# Patient Record
Sex: Male | Born: 1990
Health system: Southern US, Community
[De-identification: ages and names within clinical notes are randomized; demographics above are authoritative.]

## PROBLEM LIST (undated history)

## (undated) DIAGNOSIS — F419 Anxiety disorder, unspecified: Secondary | ICD-10-CM

## (undated) DIAGNOSIS — R000 Tachycardia, unspecified: Secondary | ICD-10-CM

## (undated) DIAGNOSIS — G90A Postural orthostatic tachycardia syndrome (POTS): Secondary | ICD-10-CM

## (undated) DIAGNOSIS — I429 Cardiomyopathy, unspecified: Secondary | ICD-10-CM

## (undated) DIAGNOSIS — K259 Gastric ulcer, unspecified as acute or chronic, without hemorrhage or perforation: Secondary | ICD-10-CM

## (undated) DIAGNOSIS — R002 Palpitations: Secondary | ICD-10-CM

## (undated) DIAGNOSIS — R079 Chest pain, unspecified: Secondary | ICD-10-CM

## (undated) DIAGNOSIS — K589 Irritable bowel syndrome without diarrhea: Secondary | ICD-10-CM

## (undated) DIAGNOSIS — K219 Gastro-esophageal reflux disease without esophagitis: Secondary | ICD-10-CM

## (undated) HISTORY — DX: Postural orthostatic tachycardia syndrome (POTS): G90.A

## (undated) HISTORY — PX: COLONOSCOPY: SHX174

## (undated) HISTORY — DX: Palpitations: R00.2

## (undated) HISTORY — PX: COSMETIC SURGERY: SHX468

## (undated) HISTORY — DX: Chest pain, unspecified: R07.9

## (undated) HISTORY — DX: Gastric ulcer, unspecified as acute or chronic, without hemorrhage or perforation: K25.9

## (undated) HISTORY — DX: Cardiomyopathy, unspecified: I42.9

---

## 2012-02-29 ENCOUNTER — Emergency Department: Payer: Self-pay | Admitting: Emergency Medicine

## 2012-02-29 LAB — URINALYSIS, COMPLETE
Bacteria: NONE SEEN
Bilirubin,UR: NEGATIVE
Blood: NEGATIVE
Leukocyte Esterase: NEGATIVE
Nitrite: NEGATIVE
Ph: 5 (ref 4.5–8.0)
Protein: NEGATIVE
Specific Gravity: 1.019 (ref 1.003–1.030)
Squamous Epithelial: NONE SEEN
WBC UR: 2 /HPF (ref 0–5)

## 2012-02-29 LAB — BASIC METABOLIC PANEL
Anion Gap: 8 (ref 7–16)
Calcium, Total: 8.3 mg/dL — ABNORMAL LOW (ref 8.5–10.1)
Chloride: 102 mmol/L (ref 98–107)
Co2: 27 mmol/L (ref 21–32)
EGFR (African American): >60
EGFR (Non-African Amer.): >60
Sodium: 137 mmol/L (ref 136–145)

## 2012-02-29 LAB — CBC WITH DIFFERENTIAL/PLATELET
Eosinophil #: 0 10*3/uL (ref 0.0–0.7)
Eosinophil %: 0.5 %
HCT: 44.7 % (ref 40.0–52.0)
MCHC: 33.9 g/dL (ref 32.0–36.0)
MCV: 93 fL (ref 80–100)
Monocyte %: 3.2 %
Neutrophil %: 93.8 %
Platelet: 138 10*3/uL — ABNORMAL LOW (ref 150–440)
RDW: 12.1 % (ref 11.5–14.5)
WBC: 6.5 10*3/uL (ref 3.8–10.6)

## 2012-02-29 LAB — RAPID INFLUENZA A&B ANTIGENS

## 2012-03-01 DIAGNOSIS — Z8711 Personal history of peptic ulcer disease: Secondary | ICD-10-CM | POA: Insufficient documentation

## 2012-03-01 DIAGNOSIS — K259 Gastric ulcer, unspecified as acute or chronic, without hemorrhage or perforation: Secondary | ICD-10-CM

## 2012-03-01 DIAGNOSIS — Z8719 Personal history of other diseases of the digestive system: Secondary | ICD-10-CM | POA: Insufficient documentation

## 2012-03-01 HISTORY — DX: Gastric ulcer, unspecified as acute or chronic, without hemorrhage or perforation: K25.9

## 2012-03-01 LAB — DRUG SCREEN, URINE
Amphetamines, Ur Screen: NEGATIVE (ref ?–1000)
Barbiturates, Ur Screen: NEGATIVE (ref ?–200)
Cannabinoid 50 Ng, Ur ~~LOC~~: NEGATIVE (ref ?–50)
Cocaine Metabolite,Ur ~~LOC~~: NEGATIVE (ref ?–300)
Phencyclidine (PCP) Ur S: NEGATIVE (ref ?–25)
Tricyclic, Ur Screen: NEGATIVE (ref ?–1000)

## 2012-03-03 LAB — BETA STREP CULTURE(ARMC)

## 2012-03-20 ENCOUNTER — Inpatient Hospital Stay: Payer: Self-pay | Admitting: Internal Medicine

## 2012-03-20 LAB — COMPREHENSIVE METABOLIC PANEL
Anion Gap: 9 (ref 7–16)
Bilirubin,Total: 1.5 mg/dL — ABNORMAL HIGH (ref 0.2–1.0)
Calcium, Total: 8.4 mg/dL — ABNORMAL LOW (ref 8.5–10.1)
Chloride: 103 mmol/L (ref 98–107)
Creatinine: 0.85 mg/dL (ref 0.60–1.30)
EGFR (African American): >60
EGFR (Non-African Amer.): >60
Glucose: 115 mg/dL — ABNORMAL HIGH (ref 65–99)
Osmolality: 283 (ref 275–301)
Sodium: 142 mmol/L (ref 136–145)
Total Protein: 6.8 g/dL (ref 6.4–8.2)

## 2012-03-20 LAB — CBC
HCT: 43.1 % (ref 40.0–52.0)
HGB: 14.1 g/dL (ref 13.0–18.0)
MCH: 30.4 pg (ref 26.0–34.0)
MCHC: 32.7 g/dL (ref 32.0–36.0)
MCV: 93 fL (ref 80–100)
Platelet: 182 10*3/uL (ref 150–440)
RBC: 4.63 10*6/uL (ref 4.40–5.90)
WBC: 4.7 10*3/uL (ref 3.8–10.6)

## 2012-03-20 LAB — URINALYSIS, COMPLETE
Bacteria: NONE SEEN
Ketone: NEGATIVE
Leukocyte Esterase: NEGATIVE
Nitrite: NEGATIVE
Ph: 7 (ref 4.5–8.0)
Protein: NEGATIVE

## 2012-03-20 LAB — DRUG SCREEN, URINE
Amphetamines, Ur Screen: NEGATIVE (ref ?–1000)
Barbiturates, Ur Screen: NEGATIVE (ref ?–200)
Benzodiazepine, Ur Scrn: NEGATIVE (ref ?–200)
MDMA (Ecstasy)Ur Screen: NEGATIVE (ref ?–500)
Methadone, Ur Screen: NEGATIVE (ref ?–300)
Phencyclidine (PCP) Ur S: NEGATIVE (ref ?–25)

## 2012-03-20 LAB — LIPASE, BLOOD: Lipase: 100 U/L (ref 73–393)

## 2012-03-20 LAB — TROPONIN I: Troponin-I: <0.02 ng/mL

## 2012-03-20 LAB — AMYLASE: Amylase: 37 U/L (ref 25–115)

## 2012-03-20 LAB — CK TOTAL AND CKMB (NOT AT ARMC): CK-MB: 1 ng/mL (ref 0.5–3.6)

## 2012-03-21 LAB — COMPREHENSIVE METABOLIC PANEL
Albumin: 3.6 g/dL (ref 3.4–5.0)
Alkaline Phosphatase: 50 U/L (ref 50–136)
Anion Gap: 5 — ABNORMAL LOW (ref 7–16)
BUN: 10 mg/dL (ref 7–18)
Bilirubin,Total: 1.9 mg/dL — ABNORMAL HIGH (ref 0.2–1.0)
Calcium, Total: 8.3 mg/dL — ABNORMAL LOW (ref 8.5–10.1)
EGFR (African American): >60
EGFR (Non-African Amer.): >60
Glucose: 94 mg/dL (ref 65–99)
Osmolality: 280 (ref 275–301)
Potassium: 3.9 mmol/L (ref 3.5–5.1)
SGOT(AST): 17 U/L (ref 15–37)
Sodium: 141 mmol/L (ref 136–145)

## 2012-03-21 LAB — CK TOTAL AND CKMB (NOT AT ARMC)
CK, Total: 90 U/L (ref 35–232)
CK, Total: 90 U/L (ref 35–232)
CK-MB: <0.5 ng/mL — ABNORMAL LOW (ref 0.5–3.6)

## 2012-03-21 LAB — BILIRUBIN, DIRECT: Bilirubin, Direct: 0.2 mg/dL (ref 0.00–0.20)

## 2012-03-21 LAB — TROPONIN I: Troponin-I: <0.02 ng/mL

## 2012-03-22 LAB — HEPATIC FUNCTION PANEL A (ARMC)
Albumin: 3.6 g/dL (ref 3.4–5.0)
Alkaline Phosphatase: 56 U/L (ref 50–136)
SGPT (ALT): 25 U/L

## 2012-03-22 LAB — BASIC METABOLIC PANEL
Calcium, Total: 8.6 mg/dL (ref 8.5–10.1)
Co2: 24 mmol/L (ref 21–32)
Creatinine: 0.79 mg/dL (ref 0.60–1.30)
EGFR (African American): >60
Glucose: 59 mg/dL — ABNORMAL LOW (ref 65–99)
Potassium: 4 mmol/L (ref 3.5–5.1)

## 2012-03-22 LAB — CBC WITH DIFFERENTIAL/PLATELET
Basophil #: 0 10*3/uL (ref 0.0–0.1)
Eosinophil #: 0.2 10*3/uL (ref 0.0–0.7)
HCT: 42 % (ref 40.0–52.0)
HGB: 13.7 g/dL (ref 13.0–18.0)
Lymphocyte #: 0.9 10*3/uL — ABNORMAL LOW (ref 1.0–3.6)
Lymphocyte %: 24.8 %
MCHC: 32.7 g/dL (ref 32.0–36.0)
Monocyte %: 8.3 %
Neutrophil %: 60 %
RDW: 12.3 % (ref 11.5–14.5)
WBC: 3.5 10*3/uL — ABNORMAL LOW (ref 3.8–10.6)

## 2012-03-23 LAB — HEPATIC FUNCTION PANEL A (ARMC)
Albumin: 3.6 g/dL (ref 3.4–5.0)
Bilirubin, Direct: 0.2 mg/dL (ref 0.00–0.20)
SGOT(AST): 18 U/L (ref 15–37)
SGPT (ALT): 24 U/L

## 2012-03-24 LAB — CBC WITH DIFFERENTIAL/PLATELET
Eosinophil #: 0.2 10*3/uL (ref 0.0–0.7)
Eosinophil %: 7.2 %
Lymphocyte #: 0.8 10*3/uL — ABNORMAL LOW (ref 1.0–3.6)
Lymphocyte %: 29 %
MCH: 30.9 pg (ref 26.0–34.0)
MCHC: 33.5 g/dL (ref 32.0–36.0)
MCV: 92 fL (ref 80–100)
Monocyte #: 0.3 x10 3/mm (ref 0.2–1.0)
Monocyte %: 11.1 %
Neutrophil #: 1.5 10*3/uL (ref 1.4–6.5)
Neutrophil %: 51.6 %
RDW: 12.3 % (ref 11.5–14.5)

## 2012-03-24 LAB — COMPREHENSIVE METABOLIC PANEL
Albumin: 3.8 g/dL (ref 3.4–5.0)
Alkaline Phosphatase: 47 U/L — ABNORMAL LOW (ref 50–136)
BUN: 8 mg/dL (ref 7–18)
Bilirubin,Total: 2.5 mg/dL — ABNORMAL HIGH (ref 0.2–1.0)
Calcium, Total: 8.7 mg/dL (ref 8.5–10.1)
Chloride: 104 mmol/L (ref 98–107)
Osmolality: 277 (ref 275–301)
SGOT(AST): 23 U/L (ref 15–37)
Sodium: 140 mmol/L (ref 136–145)

## 2012-03-30 ENCOUNTER — Ambulatory Visit (INDEPENDENT_AMBULATORY_CARE_PROVIDER_SITE_OTHER): Payer: 59 | Admitting: Internal Medicine

## 2012-03-30 ENCOUNTER — Encounter: Payer: Self-pay | Admitting: Internal Medicine

## 2012-03-30 VITALS — BP 112/82 | HR 76 | Temp 98.2°F | Resp 16 | Ht 74.0 in | Wt 148.8 lb

## 2012-03-30 DIAGNOSIS — K259 Gastric ulcer, unspecified as acute or chronic, without hemorrhage or perforation: Secondary | ICD-10-CM | POA: Insufficient documentation

## 2012-03-30 MED ORDER — HYOSCYAMINE SULFATE 0.125 MG SL SUBL: 0.1250 mg | SUBLINGUAL_TABLET | SUBLINGUAL | Status: DC | PRN

## 2012-03-30 MED ORDER — SUCRALFATE 1 GM/10ML PO SUSP: 1.0000 g | Freq: Three times a day (TID) | ORAL | Status: DC

## 2012-03-30 NOTE — Assessment & Plan Note (Signed)
He continues to have pain 6/10 after eating,.  Will add sucralfate and hyoscyamine to twice daily protonix

## 2012-03-30 NOTE — Patient Instructions (Signed)
Take the hyoscyamine pill under tongue 15 minutes before you eat to see if it helps  with the spasm you are having in your esophagus  Take the liquid medicine 2 hours after your protonix to coat your stomach,  You can take it up to 3 times daily if needed for stomach .   Drink  as many Ensure daily as you can tolerate  To regain the weight you lost.

## 2012-03-30 NOTE — Progress Notes (Signed)
Patient ID: Christian Montgomery, male   DOB: 1991-03-05, 21 y.o.   MRN: [id number] Patient Active Problem List  Diagnoses  . Gastric ulcer without hemorrhage or perforation but with obstruction    Subjective:  CC:   Chief Complaint  Patient presents with  . New Patient    HPI: To establish care.  He is a previously healthy 21 yr old white male who was admitted to [facility] with gastric outlet obstruction and wt loss of 10-15 lbs ,  Found to be secondary to gastic ulcer with surrounding inflammation, presumed secondary to NSAIDs. Was decompressed with NG tube underwent endoscopy, and discharged home after 48 hours on protonix bid,..  His GI follow up with Christian Montgomery is in 2 weeks.  He is drinking Ensure once daily.  He reports  Sensitivity to certain foods which cause  cramping and continues to have episodes of chest pain after eating. Christian Sorrow Smithis a 21 y.o. male who presents   History reviewed. No pertinent past medical history.  History reviewed. No pertinent past surgical history.       The following portions of the patient's history were reviewed and updated as appropriate: Allergies, current medications, and problem list.    Review of Systems:   12 Pt  review of systems was negative except those addressed in the HPI,     History   Social History  . Marital Status: Single    Spouse Name: N/A    Number of Children: N/A  . Years of Education: N/A   Occupational History  . Not on file.   Social History Main Topics  . Smoking status: Never Smoker   . Smokeless tobacco: Never Used  . Alcohol Use: No  . Drug Use: No  . Sexually Active: Not on file   Other Topics Concern  . Not on file   Social History Narrative  . No narrative on file    Objective:  BP 112/82  Pulse 76  Temp(Src) 98.2 F (36.8 C) (Oral)  Resp 16  Ht 6\' 2"  (1.88 m)  Wt 148 lb 12 oz (67.473 kg)  BMI 19.10 kg/m2  SpO2 100%  General appearance: alert, cooperative and appears stated age Ears:  normal TM's and external ear canals both ears Throat: lips, mucosa, and tongue normal; teeth and gums normal Neck: no adenopathy, no carotid bruit, supple, symmetrical, trachea midline and thyroid not enlarged, symmetric, no tenderness/mass/nodules Back: symmetric, no curvature. ROM normal. No CVA tenderness. Lungs: clear to auscultation bilaterally Heart: regular rate and rhythm, S1, S2 normal, no murmur, click, rub or gallop Abdomen: soft, non-tender; bowel sounds normal; no masses,  no organomegaly Pulses: 2+ and symmetric Skin: Skin color, texture, turgor normal. No rashes or lesions Lymph nodes: Cervical, supraclavicular, and axillary nodes normal.  Assessment and Plan:  Gastric ulcer without hemorrhage or perforation but with obstruction He continues to have pain 6/10 after eating,.  Will add sucralfate and hyoscyamine to twice daily protonix      Updated Medication List Outpatient Encounter Prescriptions as of 03/30/2012  Medication Sig Dispense Refill  . Multiple Vitamin (MULTIVITAMIN) tablet Take 1 tablet by mouth daily.      . pantoprazole (PROTONIX) 40 MG tablet Take 40 mg by mouth daily.      . hyoscyamine (LEVSIN SL) 0.125 MG SL tablet Place 1 tablet (0.125 mg total) under the tongue every 4 (four) hours as needed for cramping.  30 tablet  3  . sucralfate (CARAFATE) 1 GM/10ML suspension  Take 10 mLs (1 g total) by mouth 4 (four) times daily -  with meals and at bedtime.  420 mL  1     No orders of the defined types were placed in this encounter.    No Follow-up on file.

## 2012-04-07 ENCOUNTER — Encounter: Payer: Self-pay | Admitting: Internal Medicine

## 2012-04-25 ENCOUNTER — Emergency Department: Payer: Self-pay | Admitting: Emergency Medicine

## 2012-04-25 LAB — BASIC METABOLIC PANEL
Anion Gap: 7 (ref 7–16)
BUN: 10 mg/dL (ref 7–18)
Calcium, Total: 8.5 mg/dL (ref 8.5–10.1)
Chloride: 103 mmol/L (ref 98–107)
Co2: 29 mmol/L (ref 21–32)
Creatinine: 1.01 mg/dL (ref 0.60–1.30)
EGFR (Non-African Amer.): >60
Potassium: 4.1 mmol/L (ref 3.5–5.1)
Sodium: 139 mmol/L (ref 136–145)

## 2012-04-25 LAB — CBC
HCT: 43.2 % (ref 40.0–52.0)
MCH: 31.3 pg (ref 26.0–34.0)
MCHC: 33.8 g/dL (ref 32.0–36.0)
MCV: 93 fL (ref 80–100)
RBC: 4.67 10*6/uL (ref 4.40–5.90)
RDW: 12.5 % (ref 11.5–14.5)

## 2012-05-30 ENCOUNTER — Emergency Department: Payer: Self-pay | Admitting: Emergency Medicine

## 2012-05-30 LAB — URINALYSIS, COMPLETE
Bacteria: NONE SEEN
Ketone: NEGATIVE
Leukocyte Esterase: NEGATIVE
Nitrite: NEGATIVE
Ph: 9 (ref 4.5–8.0)
Protein: NEGATIVE
RBC,UR: NONE SEEN /HPF (ref 0–5)
Specific Gravity: 1.015 (ref 1.003–1.030)
Squamous Epithelial: NONE SEEN
WBC UR: <1 /HPF (ref 0–5)

## 2012-05-30 LAB — COMPREHENSIVE METABOLIC PANEL
Albumin: 4.6 g/dL (ref 3.4–5.0)
Alkaline Phosphatase: 72 U/L (ref 50–136)
Anion Gap: 4 — ABNORMAL LOW (ref 7–16)
BUN: 11 mg/dL (ref 7–18)
Calcium, Total: 8.7 mg/dL (ref 8.5–10.1)
Chloride: 105 mmol/L (ref 98–107)
Co2: 30 mmol/L (ref 21–32)
EGFR (Non-African Amer.): >60
Potassium: 4.4 mmol/L (ref 3.5–5.1)
SGOT(AST): 22 U/L (ref 15–37)
SGPT (ALT): 26 U/L
Sodium: 139 mmol/L (ref 136–145)
Total Protein: 7.4 g/dL (ref 6.4–8.2)

## 2012-05-30 LAB — CBC
HCT: 45.4 % (ref 40.0–52.0)
HGB: 15 g/dL (ref 13.0–18.0)
MCH: 30.4 pg (ref 26.0–34.0)
MCHC: 32.9 g/dL (ref 32.0–36.0)
MCV: 92 fL (ref 80–100)
Platelet: 147 10*3/uL — ABNORMAL LOW (ref 150–440)
RBC: 4.93 10*6/uL (ref 4.40–5.90)

## 2012-06-11 ENCOUNTER — Ambulatory Visit: Payer: Self-pay | Admitting: Unknown Physician Specialty

## 2012-06-15 ENCOUNTER — Ambulatory Visit: Payer: 59 | Admitting: Internal Medicine

## 2012-09-03 ENCOUNTER — Ambulatory Visit: Payer: Self-pay | Admitting: Internal Medicine

## 2012-09-03 ENCOUNTER — Encounter: Payer: Self-pay | Admitting: Internal Medicine

## 2012-09-03 ENCOUNTER — Ambulatory Visit (INDEPENDENT_AMBULATORY_CARE_PROVIDER_SITE_OTHER): Payer: 59 | Admitting: Internal Medicine

## 2012-09-03 VITALS — BP 118/64 | HR 63 | Temp 98.1°F | Ht 73.5 in | Wt 152.5 lb

## 2012-09-03 DIAGNOSIS — K259 Gastric ulcer, unspecified as acute or chronic, without hemorrhage or perforation: Secondary | ICD-10-CM

## 2012-09-03 DIAGNOSIS — R1013 Epigastric pain: Secondary | ICD-10-CM

## 2012-09-03 DIAGNOSIS — R634 Abnormal weight loss: Secondary | ICD-10-CM

## 2012-09-03 DIAGNOSIS — R1033 Periumbilical pain: Secondary | ICD-10-CM

## 2012-09-03 DIAGNOSIS — R109 Unspecified abdominal pain: Secondary | ICD-10-CM | POA: Insufficient documentation

## 2012-09-03 DIAGNOSIS — R52 Pain, unspecified: Secondary | ICD-10-CM

## 2012-09-03 DIAGNOSIS — K253 Acute gastric ulcer without hemorrhage or perforation: Secondary | ICD-10-CM

## 2012-09-03 DIAGNOSIS — R1032 Left lower quadrant pain: Secondary | ICD-10-CM

## 2012-09-03 DIAGNOSIS — R42 Dizziness and giddiness: Secondary | ICD-10-CM

## 2012-09-03 LAB — POCT URINALYSIS DIPSTICK
Bilirubin, UA: NEGATIVE
Blood, UA: NEGATIVE
Blood, UA: NEGATIVE
Leukocytes, UA: NEGATIVE
Nitrite, UA: NEGATIVE
Nitrite, UA: NEGATIVE
Spec Grav, UA: 1.015
Urobilinogen, UA: 0.2
pH, UA: >=9
pH, UA: >=9

## 2012-09-03 MED ORDER — DEXLANSOPRAZOLE 60 MG PO CPDR: 60.0000 mg | DELAYED_RELEASE_CAPSULE | Freq: Every day | ORAL | Status: DC

## 2012-09-03 MED ORDER — HYOSCYAMINE SULFATE 0.125 MG SL SUBL: 0.1250 mg | SUBLINGUAL_TABLET | SUBLINGUAL | Status: DC | PRN

## 2012-09-03 NOTE — Assessment & Plan Note (Addendum)
His exam was notable for guarding and diffuse pain.  A contrasted CT was done to rule out appendicitis.  Gastric distension without obstruction was noted, with small bowel dilation consistent with ileus.  He was given samples of Dexilant #15 and  instructed to alter diet to clear liquids for 48 hours.  He will contact the office in 72 hours  For an update bt was advised to go to the ER if he had any emesis.

## 2012-09-03 NOTE — Progress Notes (Signed)
Patient ID: Christian Montgomery, male   DOB: 12-Sep-1991, 21 y.o.   MRN: [id number]  Patient Active Problem List  Diagnosis  . Gastric ulcer without hemorrhage or perforation but with obstruction  . Abdominal pain, acute, epigastric  . Acute gastric ulcer without hemorrhage or perforation and without obstruction  . Gastric ulcer with obstruction    Subjective:  CC:   Chief Complaint  Patient presents with  . Dizziness    HPI:    Christian Montgomery a 21 y.o. male who presents with Abdominal pain and dizziness.  Mr. Christian Montgomery is a 21 yr old white male with a recent history of gastric  ulcer who was seen as a new patient after discharge from [facility] in April 2013 for gastric outlet obstruction. At time of first visit with me he was on a PPI but still having abdominal ain and was given hyoscyamine and sucralfate. He states today that his gastroenterologist, Dr. Mechele Collin repeated his endoscopy in July and stopped all of his medications since the ulcer had resolved.  He was asymptomatic for two months following the July endoscopy but began having periumbilical, epigastric and LLQ pain accompanied by occasional nausea one week ago.  The pain and nausea occurs postprandially and has not been accompanied by vomiting.  His stools have been black and runny for two weeks,  And he reports episodes of vertigo and weakness after eating .  Last episode was two days ago. He has not been using an NSAIDs, alcohol or tobacco products.  No sick contacts.         Past Medical History  Diagnosis Date  . Gastric ulcer with obstruction April 2013    hosp [facility] , [facility]    History reviewed. No pertinent past surgical history.       The following portions of the patient's history were reviewed and updated as appropriate: Allergies, current medications, and problem list.    Review of Systems:   12 Pt  review of systems was negative except those addressed in the HPI,     History   Social History  . Marital Status:  Single    Spouse Name: N/A    Number of Children: N/A  . Years of Education: N/A   Occupational History  . Not on file.   Social History Main Topics  . Smoking status: Never Smoker   . Smokeless tobacco: Never Used  . Alcohol Use: No  . Drug Use: No  . Sexually Active: Not on file   Other Topics Concern  . Not on file   Social History Narrative  . No narrative on file    Objective:  BP 118/64  Pulse 63  Temp 98.1 F (36.7 C) (Oral)  Ht 6' 1.5" (1.867 m)  Wt 152 lb 8 oz (69.174 kg)  BMI 19.85 kg/m2  SpO2 98%  General appearance: alert, cooperative and appears stated age Ears: normal TM's and external ear canals both ears Throat: lips, mucosa, and tongue normal; teeth and gums normal Neck: no adenopathy, no carotid bruit, supple, symmetrical, trachea midline and thyroid not enlarged, symmetric, no tenderness/mass/nodules Back: symmetric, no curvature. ROM normal. No CVA tenderness. Lungs: clear to auscultation bilaterally Heart: regular rate and rhythm, S1, S2 normal, no murmur, click, rub or gallop Abdomen: scaphoid, soft,nondistended, tender diffusely, with guarding and rebound, bowel sounds normal; no masses Pulses: 2+ and symmetric Skin: Skin color, texture, turgor normal. No rashes or lesions Lymph nodes: Cervical, supraclavicular, and axillary nodes normal.  Assessment and Plan:  Abdominal pain, acute, epigastric His exam was notable for guarding and diffuse pain.  A contrasted CT was done to rule out appendicitis.  Gastric distension without obstruction was noted, with small bowel dilation consistent with ileus.  He was given samples of Dexilant #15 and  instructed to alter diet to clear liquids for 48 hours.  He will contact the office in 72 hours  For an update bt was advised to go to the ER if he had any emesis.   Gastric ulcer without hemorrhage or perforation but with obstruction Suspected, given findings of  gGastric distension without obstruction on  contrasted CT. ,He was given samples of Dexilant #15 and  instructed to alter diet to clear liquids for 48 hours.  He will contact the office in 72 hours  For an update bt was advised to go to the ER if he had any emesis.    Updated Medication List Outpatient Encounter Prescriptions as of 09/03/2012  Medication Sig Dispense Refill  . Multiple Vitamin (MULTIVITAMIN) tablet Take 1 tablet by mouth daily.      Marland Kitchen dexlansoprazole (DEXILANT) 60 MG capsule Take 1 capsule (60 mg total) by mouth daily.  30 capsule  1  . hyoscyamine (LEVSIN SL) 0.125 MG SL tablet Place 1 tablet (0.125 mg total) under the tongue every 4 (four) hours as needed for cramping.  30 tablet  3  . sucralfate (CARAFATE) 1 GM/10ML suspension Take 10 mLs (1 g total) by mouth 4 (four) times daily -  with meals and at bedtime.  420 mL  1  . DISCONTD: hyoscyamine (LEVSIN SL) 0.125 MG SL tablet Place 1 tablet (0.125 mg total) under the tongue every 4 (four) hours as needed for cramping.  30 tablet  3  . DISCONTD: hyoscyamine (LEVSIN SL) 0.125 MG SL tablet Place 1 tablet (0.125 mg total) under the tongue every 4 (four) hours as needed for cramping.  90 tablet  3  . DISCONTD: pantoprazole (PROTONIX) 40 MG tablet Take 40 mg by mouth daily.         Orders Placed This Encounter  Procedures  . Urine culture  . CT Abdomen Pelvis W Contrast  . POCT urinalysis dipstick  . POCT urinalysis dipstick  . EKG 12-Lead    No Follow-up on file.

## 2012-09-03 NOTE — Patient Instructions (Addendum)
I am sending you to the hospital for a CT scan to rule out appendicitis and perforation.  If the Ct is positive you will be sent to the ER for admission.  If the CT is negative for those conditions, you will be sent home and I will call in a medice for pain and have you start the stomach medication, Dexilant Today,  And continue taking it daily in the morning.  Clear liquid iet ofr 3 days,  Then boiled chicken, rice, mashed potatoes   Avoid salad and dairy for 5 days

## 2012-09-05 ENCOUNTER — Encounter: Payer: Self-pay | Admitting: Internal Medicine

## 2012-09-05 DIAGNOSIS — K253 Acute gastric ulcer without hemorrhage or perforation: Secondary | ICD-10-CM | POA: Insufficient documentation

## 2012-09-05 DIAGNOSIS — K259 Gastric ulcer, unspecified as acute or chronic, without hemorrhage or perforation: Secondary | ICD-10-CM | POA: Insufficient documentation

## 2012-09-05 LAB — URINE CULTURE
Colony Count: NO GROWTH
Organism ID, Bacteria: NO GROWTH

## 2012-09-05 NOTE — Assessment & Plan Note (Addendum)
Suspected, given findings of  gGastric distension without obstruction on contrasted CT. ,He was given samples of Dexilant #15 and  instructed to alter diet to clear liquids for 48 hours.  He will contact the office in 72 hours  For an update bt was advised to go to the ER if he had any emesis. Will check H Pylori , cbc and lytes.

## 2012-09-07 ENCOUNTER — Telehealth: Payer: Self-pay | Admitting: Internal Medicine

## 2012-09-07 ENCOUNTER — Other Ambulatory Visit: Payer: Self-pay | Admitting: Internal Medicine

## 2012-09-07 DIAGNOSIS — K259 Gastric ulcer, unspecified as acute or chronic, without hemorrhage or perforation: Secondary | ICD-10-CM

## 2012-09-07 DIAGNOSIS — R109 Unspecified abdominal pain: Secondary | ICD-10-CM

## 2012-09-07 NOTE — Telephone Encounter (Signed)
[facility] was calling back. They agreed to see patient Today at 2:30 pm .

## 2012-09-17 ENCOUNTER — Ambulatory Visit: Payer: Self-pay | Admitting: Unknown Physician Specialty

## 2012-09-20 ENCOUNTER — Encounter: Payer: Self-pay | Admitting: Internal Medicine

## 2012-09-21 ENCOUNTER — Ambulatory Visit (INDEPENDENT_AMBULATORY_CARE_PROVIDER_SITE_OTHER): Payer: 59 | Admitting: Internal Medicine

## 2012-09-21 ENCOUNTER — Encounter: Payer: Self-pay | Admitting: Internal Medicine

## 2012-09-21 VITALS — BP 120/62 | HR 71 | Temp 97.7°F | Ht 73.5 in | Wt 149.8 lb

## 2012-09-21 DIAGNOSIS — R7989 Other specified abnormal findings of blood chemistry: Secondary | ICD-10-CM

## 2012-09-21 DIAGNOSIS — R42 Dizziness and giddiness: Secondary | ICD-10-CM

## 2012-09-21 DIAGNOSIS — E162 Hypoglycemia, unspecified: Secondary | ICD-10-CM

## 2012-09-21 DIAGNOSIS — R634 Abnormal weight loss: Secondary | ICD-10-CM

## 2012-09-21 DIAGNOSIS — K253 Acute gastric ulcer without hemorrhage or perforation: Secondary | ICD-10-CM

## 2012-09-21 DIAGNOSIS — R109 Unspecified abdominal pain: Secondary | ICD-10-CM

## 2012-09-21 LAB — CBC WITH DIFFERENTIAL/PLATELET
Basophils Relative: 0.6 % (ref 0.0–3.0)
Eosinophils Relative: 3.2 % (ref 0.0–5.0)
HCT: 45.7 % (ref 39.0–52.0)
Lymphs Abs: 0.7 10*3/uL (ref 0.7–4.0)
MCV: 92.4 fl (ref 78.0–100.0)
Monocytes Absolute: 0.3 10*3/uL (ref 0.1–1.0)
Monocytes Relative: 7.4 % (ref 3.0–12.0)
Neutrophils Relative %: 70 % (ref 43.0–77.0)
RBC: 4.95 Mil/uL (ref 4.22–5.81)
WBC: 3.6 10*3/uL — ABNORMAL LOW (ref 4.5–10.5)

## 2012-09-21 LAB — H. PYLORI ANTIBODY, IGG: H Pylori IgG: NEGATIVE

## 2012-09-21 LAB — COMPREHENSIVE METABOLIC PANEL
Albumin: 4.3 g/dL (ref 3.5–5.2)
Alkaline Phosphatase: 46 U/L (ref 39–117)
BUN: 9 mg/dL (ref 6–23)
CO2: 31 mEq/L (ref 19–32)
GFR: 119.54 mL/min (ref >60.00)
Glucose, Bld: 88 mg/dL (ref 70–99)
Potassium: 4.2 mEq/L (ref 3.5–5.1)
Total Protein: 7 g/dL (ref 6.0–8.3)

## 2012-09-21 LAB — MAGNESIUM: Magnesium: 1.9 mg/dL (ref 1.5–2.5)

## 2012-09-21 NOTE — Progress Notes (Signed)
Patient ID: Smauel Montgomery, male   DOB: October 20, 1991, 21 y.o.   MRN: [id number]  Patient Active Problem List  Diagnosis  . Abdominal pain of multiple sites  . Acute gastric ulcer without hemorrhage or perforation and without obstruction  . Elevated liver function tests  . Dizziness    Subjective:  CC:   Chief Complaint  Patient presents with  . Follow-up    HPI:   Christian Montgomery a 21 y.o. male who presents for follow up on abdominal pain abnormal liver function tests..  He has a history of gastric ulcer in April 2013 requiring hospitalizaiton for gastric outlet obstruction . Symptoms resolved without surgical intervention on medical therapy but he stopped his PPI and symptoms returned about 4-5 weeks ago. He continues to have frequent episodes of  abdominal pain aggravated by sweet tea, spicy food, and sodas which he has not stopped eating.  He is accompanied by his mother today. His mother reports that his diet is full junk food and he has not been following the recommendations of Dr. Mechele Collin. He had an incomplete  colonoscopy on Friday. The colonoscopy was not complete because he did not consume all of the prep because he did like the way tasted. He continues to get have episodes of feeling lightheaded and tremulous,  But  denies any recent vomiting. Has not eaten today .   his episodes improve after eating.  He denies any recent use of nonsteroidal anti-inflammatories aspirin and alcohol.   Past Medical History  Diagnosis Date  . Gastric ulcer with obstruction April 2013    hosp [facility] , [facility]    No past surgical history on file.       The following portions of the patient's history were reviewed and updated as appropriate: Allergies, current medications, and problem list.    Review of Systems:   12 Pt  review of systems was negative except those addressed in the HPI,     History   Social History  . Marital Status: Single    Spouse Name: N/A    Number of Children:  N/A  . Years of Education: N/A   Occupational History  . Not on file.   Social History Main Topics  . Smoking status: Never Smoker   . Smokeless tobacco: Never Used  . Alcohol Use: No  . Drug Use: No  . Sexually Active: Not on file   Other Topics Concern  . Not on file   Social History Narrative  . No narrative on file    Objective:  BP 120/62  Pulse 71  Temp 97.7 F (36.5 C) (Oral)  Ht 6' 1.5" (1.867 m)  Wt 149 lb 12 oz (67.926 kg)  BMI 19.49 kg/m2  SpO2 99%  General appearance: alert, cooperative and appears stated age Ears: normal TM's and external ear canals both ears Throat: lips, mucosa, and tongue normal; teeth and gums normal Neck: no adenopathy, no carotid bruit, supple, symmetrical, trachea midline and thyroid not enlarged, symmetric, no tenderness/mass/nodules Back: symmetric, no curvature. ROM normal. No CVA tenderness. Lungs: clear to auscultation bilaterally Heart: regular rate and rhythm, S1, S2 normal, no murmur, click, rub or gallop Abdomen: soft, non-tender; bowel sounds normal; no masses,  no organomegaly Pulses: 2+ and symmetric Skin: Skin color, texture, turgor normal. No rashes or lesions Lymph nodes: Cervical, supraclavicular, and axillary nodes normal.  Assessment and Plan:  Abdominal pain of multiple sites Etiology unclear. A colonoscopy was incomplete due to inadequate prep.  I discussed  his case with Dr. Mechele Collin who has recommended an upper GI small bowel follow-through. This will be ordered today. I reminded patient that he needs to stop eating junk food and he needs to limit also as from his diet. His weight is fortunately stable. Continue  proton pump inhibitor follow up with Dr. Mechele Collin next week.  Elevated liver function tests He was noted to have elevated liver tests when seen by gastroenterology with no workup was done. I repeated them today and he does continue to have elevated bilirubin with normal liver enzymes. A abdominal  ultrasound will be ordered after his other imaging studies.  Dizziness Given his family history of diabetes and his poor eating habits I have screened him for diabetes. He has no evidence of a hemoglobin A1c or a fasting glucose today.   Updated Medication List Outpatient Encounter Prescriptions as of 09/21/2012  Medication Sig Dispense Refill  . Multiple Vitamin (MULTIVITAMIN) tablet Take 1 tablet by mouth daily.      . pantoprazole (PROTONIX) 20 MG tablet Take 20 mg by mouth daily.      Marland Kitchen dexlansoprazole (DEXILANT) 60 MG capsule Take 1 capsule (60 mg total) by mouth daily.  30 capsule  1  . hyoscyamine (LEVSIN SL) 0.125 MG SL tablet Place 1 tablet (0.125 mg total) under the tongue every 4 (four) hours as needed for cramping.  30 tablet  3  . sucralfate (CARAFATE) 1 GM/10ML suspension Take 10 mLs (1 g total) by mouth 4 (four) times daily -  with meals and at bedtime.  420 mL  1     Orders Placed This Encounter  Procedures  . DG UGI W/Small Bowel High Density  . US Abdomen Limited  . Hemoglobin A1c    No Follow-up on file.

## 2012-09-21 NOTE — Patient Instructions (Addendum)
I ahecking you for diabetes because of the episodes of low blood sugars  You must stop drink sodas because they are irritating your stomachLow Blood Sugar Low blood sugar (hypoglycemia) means that the level of sugar in your blood is lower than it should be. Signs of low blood sugar include:  Getting sweaty.   Feeling hungry.   Feeling dizzy or weak.   Feeling sleepier than normal.   Feeling nervous.   Headaches.   Having a fast heartbeat.  Low blood sugar can happen fast and can be an emergency. Your doctor can do tests to check your blood sugar level. You can have low blood sugar and not have diabetes. HOME CARE  Check your blood sugar as told by your doctor. If it is less than 70 mg/dl or as told by your doctor, take 1 of the following:   3 to 4 glucose tablets.    cup clear juice.    cup soda pop, not diet.   1 cup milk.   5 to 6 hard candies.   Recheck blood sugar after 15 minutes. Repeat until it is at the right level.   Eat a snack if it is more than 1 hour until the next meal.   Only take medicine as told by your doctor.   Do not skip meals. Eat on time.   Do not drink alcohol except with meals.   Check your blood glucose before driving.   Check your blood glucose before and after exercise.   Always carry treatment with you, such as glucose pills.   Always wear a medical alert bracelet if you have diabetes.  GET HELP RIGHT AWAY IF:    Your blood glucose goes below 70 mg/dl or as told by your doctor, and you:   Are confused.   Are not able to swallow.   Pass out (faint).   You cannot treat yourself. You may need someone to help you.   You have low blood sugar problems often.   You have problems from your medicines.   You are not feeling better after 3 to 4 days.   You have vision changes.  MAKE SURE YOU:    Understand these instructions.   Will watch this condition.   Will get help right away if you are not doing well or get worse.    Document Released: 02/11/2010 Document Revised: 02/09/2012 Document Reviewed: 02/11/2010 [facility] Patient Information 2013 [place], [place].   Hypoglycemia (Low Blood Sugar) Hypoglycemia is when the glucose (sugar) in your blood is too low. Hypoglycemia can happen for many reasons. It can happen to people with or without diabetes. Hypoglycemia can develop quickly and can be a medical emergency.   CAUSES   Having hypoglycemia does not mean that you will develop diabetes. Different causes include:  Missed or delayed meals or not enough carbohydrates eaten.   Medication overdose. This could be by accident or deliberate. If by accident, your medication may need to be adjusted or changed.   Exercise or increased activity without adjustments in carbohydrates or medications.   A nerve disorder that affects body functions like your heart rate, blood pressure and digestion (autonomic neuropathy).   A condition where the stomach muscles do not function properly (gastroparesis). Therefore, medications may not absorb properly.   The inability to recognize the signs of hypoglycemia (hypoglycemic unawareness).   Absorption of insulin  may be altered.   Alcohol consumption.   Pregnancy/menstrual cycles/postpartum. This may be due to hormones.  Certain kinds of tumors. This is very rare.  SYMPTOMS    Sweating.   Hunger.   Dizziness.   Blurred vision.   Drowsiness.   Weakness.   Headache.   Rapid heart beat.   Shakiness.   Nervousness.  DIAGNOSIS  Diagnosis is made by monitoring blood glucose in one or all of the following ways:  Fingerstick blood glucose monitoring.   Laboratory results.  TREATMENT   If you think your blood glucose is low:  Check your blood glucose, if possible. If it is less than 70 mg/dl, take one of the following:   3-4 glucose tablets.    cup juice (prefer clear like apple).    cup "regular" soda pop.   1 cup milk.   -1 tube of glucose gel.    5-6 hard candies.   Do not over treat because your blood glucose (sugar) will only go too high.   Wait 15 minutes and recheck your blood glucose. If it is still less than 70 mg/dl (or below your target range), repeat treatment.   Eat a snack if it is more than one hour until your next meal.  Sometimes, your blood glucose may go so low that you are unable to treat yourself. You may need someone to help you. You may even pass out or be unable to swallow. This may require you to get an injection of glucagon, which raises the blood glucose. HOME CARE INSTRUCTIONS  Check blood glucose as recommended by your caregiver.   Take medication as prescribed by your caregiver.   Follow your meal plan. Do not skip meals. Eat on time.   If you are going to drink alcohol, drink it only with meals.   Check your blood glucose before driving.   Check your blood glucose before and after exercise. If you exercise longer or different than usual, be sure to check blood glucose more frequently.   Always carry treatment with you. Glucose tablets are the easiest to carry.   Always wear medical alert jewelry or carry some form of identification that states that you have diabetes. This will alert people that you have diabetes. If you have hypoglycemia, they will have a better idea on what to do.  SEEK MEDICAL CARE IF:    You are having problems keeping your blood sugar at target range.   You are having frequent episodes of hypoglycemia.   You feel you might be having side effects from your medicines.   You have symptoms of an illness that is not improving after 3-4 days.   You notice a change in vision or a new problem with your vision.  SEEK IMMEDIATE MEDICAL CARE IF:    You are a family member or friend of a person whose blood glucose goes below 70 mg/dl and is accompanied by:   Confusion.   A change in mental status.   The inability to swallow.   Passing out.  Document Released: 11/17/2005  Document Revised: 02/09/2012 Document Reviewed: 03/15/2012 [facility] Patient Information 2013 [place], [place].

## 2012-09-22 DIAGNOSIS — K760 Fatty (change of) liver, not elsewhere classified: Secondary | ICD-10-CM | POA: Insufficient documentation

## 2012-09-22 DIAGNOSIS — R42 Dizziness and giddiness: Secondary | ICD-10-CM | POA: Insufficient documentation

## 2012-09-22 NOTE — Assessment & Plan Note (Signed)
Given his family history of diabetes and his poor eating habits I have screened him for diabetes. He has no evidence of a hemoglobin A1c or a fasting glucose today.

## 2012-09-22 NOTE — Assessment & Plan Note (Signed)
Etiology unclear. A colonoscopy was incomplete due to inadequate prep.  I discussed his case with Dr. Mechele Collin who has recommended an upper GI small bowel follow-through. This will be ordered today. I reminded patient that he needs to stop eating junk food and he needs to limit also as from his diet. His weight is fortunately stable. Continue  proton pump inhibitor follow up with Dr. Mechele Collin next week.

## 2012-09-22 NOTE — Assessment & Plan Note (Signed)
He was noted to have elevated liver tests when seen by gastroenterology with no workup was done. I repeated them today and he does continue to have elevated bilirubin with normal liver enzymes. A abdominal ultrasound will be ordered after his other imaging studies.

## 2012-09-28 ENCOUNTER — Ambulatory Visit: Payer: Self-pay | Admitting: Internal Medicine

## 2012-09-30 ENCOUNTER — Telehealth: Payer: Self-pay | Admitting: Internal Medicine

## 2012-09-30 DIAGNOSIS — R7989 Other specified abnormal findings of blood chemistry: Secondary | ICD-10-CM

## 2012-10-07 ENCOUNTER — Encounter: Payer: Self-pay | Admitting: Internal Medicine

## 2012-10-12 ENCOUNTER — Encounter: Payer: Self-pay | Admitting: Internal Medicine

## 2012-10-12 ENCOUNTER — Ambulatory Visit (INDEPENDENT_AMBULATORY_CARE_PROVIDER_SITE_OTHER): Payer: 59 | Admitting: Internal Medicine

## 2012-10-12 VITALS — BP 110/64 | HR 65 | Temp 97.6°F | Resp 12 | Ht 73.5 in | Wt 150.5 lb

## 2012-10-12 DIAGNOSIS — K5909 Other constipation: Secondary | ICD-10-CM

## 2012-10-12 DIAGNOSIS — R1011 Right upper quadrant pain: Secondary | ICD-10-CM

## 2012-10-12 DIAGNOSIS — R52 Pain, unspecified: Secondary | ICD-10-CM

## 2012-10-12 DIAGNOSIS — K253 Acute gastric ulcer without hemorrhage or perforation: Secondary | ICD-10-CM

## 2012-10-12 DIAGNOSIS — R42 Dizziness and giddiness: Secondary | ICD-10-CM

## 2012-10-12 DIAGNOSIS — R7989 Other specified abnormal findings of blood chemistry: Secondary | ICD-10-CM

## 2012-10-12 DIAGNOSIS — K5904 Chronic idiopathic constipation: Secondary | ICD-10-CM

## 2012-10-12 LAB — HEPATIC FUNCTION PANEL
ALT: 19 U/L (ref 0–53)
AST: 20 U/L (ref 0–37)
Alkaline Phosphatase: 52 U/L (ref 39–117)
Total Bilirubin: 2.4 mg/dL — ABNORMAL HIGH (ref 0.3–1.2)

## 2012-10-12 LAB — FERRITIN: Ferritin: 35.1 ng/mL (ref 22.0–322.0)

## 2012-10-12 MED ORDER — DEXLANSOPRAZOLE 60 MG PO CPDR: 60.0000 mg | DELAYED_RELEASE_CAPSULE | Freq: Every day | ORAL | Status: DC

## 2012-10-12 NOTE — Progress Notes (Signed)
Patient ID: Christian Montgomery, male   DOB: 1991/06/02, 21 y.o.   MRN: [id number] Patient Active Problem List  Diagnosis  . Abdominal pain of multiple sites  . Acute gastric ulcer without hemorrhage or perforation and without obstruction  . Elevated liver function tests  . Dizziness    Subjective:  CC:   Chief Complaint  Patient presents with  . Follow-up    HPI:  Christian Montgomery a 21 y.o. male who presents for follow up on abdominal pain and elevated LFTS and screening for diabetes.  His nausea has improved .  He is still constipated. He is not  taking any laxatives.  His repeat colonoscopy is scheduled for Dec 17th with Dr. Mechele Collin.  He has not been complaint with the recommended dietary changes but is taking protonix daily.  He continues to have post prandial cramping that occurs every time he eats and last up to 10 minutes and has seen no change with trial of hyoscyamine.  His abdominal ultrasound suggested normal appearing GB and liver.  His total biliriubin remains elevated.     Past Medical History  Diagnosis Date  . Gastric ulcer with obstruction April 2013    hosp [facility] , [facility]    No past surgical history on file.       The following portions of the patient's history were reviewed and updated as appropriate: Allergies, current medications, and problem list.    Review of Systems:   12 Pt  review of systems was negative except those addressed in the HPI,     History   Social History  . Marital Status: Single    Spouse Name: N/A    Number of Children: N/A  . Years of Education: N/A   Occupational History  . Not on file.   Social History Main Topics  . Smoking status: Never Smoker   . Smokeless tobacco: Never Used  . Alcohol Use: No  . Drug Use: No  . Sexually Active: Not on file   Other Topics Concern  . Not on file   Social History Narrative  . No narrative on file    Objective:  BP 110/64  Pulse 65  Temp 97.6 F (36.4 C) (Oral)  Resp 12   Ht 6' 1.5" (1.867 m)  Wt 150 lb 8 oz (68.266 kg)  BMI 19.59 kg/m2  SpO2 99%  General appearance: alert, cooperative and appears stated age Ears: normal TM's and external ear canals both ears Throat: lips, mucosa, and tongue normal; teeth and gums normal Neck: no adenopathy, no carotid bruit, supple, symmetrical, trachea midline and thyroid not enlarged, symmetric, no tenderness/mass/nodules Back: symmetric, no curvature. ROM normal. No CVA tenderness. Lungs: clear to auscultation bilaterally Heart: regular rate and rhythm, S1, S2 normal, no murmur, click, rub or gallop Abdomen: soft, tneder to palpation in mid epigastric region without guarding or rebound, BS normal. No  masses,  no organomegaly Pulses: 2+ and symmetric Skin: Skin color, texture, turgor normal. No rashes or lesions Lymph nodes: Cervical, supraclavicular, and axillary nodes normal.  Assessment and Plan: Dizziness Previously reported episodes were suspected to be due to hypoglycemia, but he he has no evidence of diabetes by recent testing.    Acute gastric ulcer without hemorrhage or perforation and without obstruction Symptoms have improved on PPI but not resolved.  Continue PPI and GI followup.   Elevated liver function tests Workup for autoimmune causes underway.  HIDA scan ordered given continued postprandial cramping to rule out biliary dyskinesia.,  Constipation - functional Recommended daily use of fiber laxative.  If colonoscopy is normal and constipatin persists will consider treatment for IBS with amitiza or linzess    Updated Medication List Outpatient Encounter Prescriptions as of 10/12/2012  Medication Sig Dispense Refill  . dexlansoprazole (DEXILANT) 60 MG capsule Take 1 capsule (60 mg total) by mouth daily.  30 capsule  3  . hyoscyamine (LEVSIN SL) 0.125 MG SL tablet Place 1 tablet (0.125 mg total) under the tongue every 4 (four) hours as needed for cramping.  30 tablet  3  . Multiple Vitamin  (MULTIVITAMIN) tablet Take 1 tablet by mouth daily.      . pantoprazole (PROTONIX) 20 MG tablet Take 20 mg by mouth daily.      . [DISCONTINUED] dexlansoprazole (DEXILANT) 60 MG capsule Take 1 capsule (60 mg total) by mouth daily.  30 capsule  1  . sucralfate (CARAFATE) 1 GM/10ML suspension Take 10 mLs (1 g total) by mouth 4 (four) times daily -  with meals and at bedtime.  420 mL  1

## 2012-10-12 NOTE — Patient Instructions (Signed)
Stop drinking Gatorade ,  Juice,  Soda and tea.  They are bothering your stomach   Drink only gatorade or water  I am substituting Dexilant for the protonix,  Take it first thing in the morning, at least 20 minutes before any food.

## 2012-10-13 LAB — IRON AND TIBC
%SAT: 25 % (ref 20–55)
Iron: 92 ug/dL (ref 42–165)
UIBC: 279 ug/dL (ref 125–400)

## 2012-10-13 LAB — ANTI-SMITH ANTIBODY: ENA SM Ab Ser-aCnc: <1 AU/mL (ref <30)

## 2012-10-14 ENCOUNTER — Encounter: Payer: Self-pay | Admitting: Internal Medicine

## 2012-10-14 DIAGNOSIS — K5904 Chronic idiopathic constipation: Secondary | ICD-10-CM | POA: Insufficient documentation

## 2012-10-14 NOTE — Assessment & Plan Note (Signed)
Previously reported episodes were suspected to be due to hypoglycemia, but he he has no evidence of diabetes by recent testing.

## 2012-10-14 NOTE — Assessment & Plan Note (Signed)
Symptoms have improved on PPI but not resolved.  Continue PPI and GI followup.

## 2012-10-14 NOTE — Assessment & Plan Note (Signed)
Workup for autoimmune causes underway.  HIDA scan ordered given continued postprandial cramping to rule out biliary dyskinesia.,

## 2012-10-14 NOTE — Assessment & Plan Note (Addendum)
Recommended daily use of fiber laxative.  If colonoscopy is normal and constipatin persists will consider treatment for IBS with amitiza or linzess

## 2012-10-19 ENCOUNTER — Ambulatory Visit: Payer: Self-pay | Admitting: Internal Medicine

## 2012-10-20 ENCOUNTER — Encounter: Payer: Self-pay | Admitting: Internal Medicine

## 2012-10-20 ENCOUNTER — Telehealth: Payer: Self-pay | Admitting: Internal Medicine

## 2012-10-20 NOTE — Telephone Encounter (Signed)
Your HIDA scan (test on the gallbladder to see if it is working properly) was normal.

## 2012-10-21 ENCOUNTER — Encounter: Payer: Self-pay | Admitting: Internal Medicine

## 2012-11-01 ENCOUNTER — Encounter: Payer: Self-pay | Admitting: Internal Medicine

## 2012-11-12 ENCOUNTER — Telehealth: Payer: Self-pay | Admitting: Internal Medicine

## 2012-11-12 NOTE — Telephone Encounter (Signed)
Yes make appt

## 2012-11-12 NOTE — Telephone Encounter (Signed)
Pt sent my chart message wanting to get his Tetanus/Tdap.  Is it ok to make appointment

## 2012-11-16 ENCOUNTER — Ambulatory Visit: Payer: Self-pay | Admitting: Unknown Physician Specialty

## 2012-11-17 ENCOUNTER — Ambulatory Visit (INDEPENDENT_AMBULATORY_CARE_PROVIDER_SITE_OTHER): Payer: 59 | Admitting: Internal Medicine

## 2012-11-17 DIAGNOSIS — Z23 Encounter for immunization: Secondary | ICD-10-CM

## 2012-11-21 NOTE — Progress Notes (Signed)
Patient ID: Christian Montgomery, male   DOB: 1991-01-01, 21 y.o.   MRN: [id number] Patient is here for an injection.

## 2012-11-24 ENCOUNTER — Encounter: Payer: Self-pay | Admitting: Internal Medicine

## 2012-12-05 ENCOUNTER — Emergency Department: Payer: Self-pay | Admitting: Emergency Medicine

## 2012-12-05 LAB — LIPASE, BLOOD: Lipase: 95 U/L (ref 73–393)

## 2012-12-05 LAB — COMPREHENSIVE METABOLIC PANEL
Alkaline Phosphatase: 76 U/L (ref 50–136)
Anion Gap: 10 (ref 7–16)
BUN: 12 mg/dL (ref 7–18)
Bilirubin,Total: 1.6 mg/dL — ABNORMAL HIGH (ref 0.2–1.0)
Calcium, Total: 8.5 mg/dL (ref 8.5–10.1)
Chloride: 103 mmol/L (ref 98–107)
Co2: 26 mmol/L (ref 21–32)
Creatinine: 0.92 mg/dL (ref 0.60–1.30)
EGFR (African American): >60
EGFR (Non-African Amer.): >60
Glucose: 122 mg/dL — ABNORMAL HIGH (ref 65–99)
Osmolality: 279 (ref 275–301)
Potassium: 3.8 mmol/L (ref 3.5–5.1)
SGPT (ALT): 26 U/L (ref 12–78)
Sodium: 139 mmol/L (ref 136–145)

## 2012-12-05 LAB — URINALYSIS, COMPLETE
Blood: NEGATIVE
Glucose,UR: NEGATIVE mg/dL (ref 0–75)
Leukocyte Esterase: NEGATIVE
Ph: 6 (ref 4.5–8.0)
Specific Gravity: 1.015 (ref 1.003–1.030)
Squamous Epithelial: NONE SEEN
WBC UR: NONE SEEN /HPF (ref 0–5)

## 2012-12-05 LAB — CBC
HCT: 42 % (ref 40.0–52.0)
HGB: 14.6 g/dL (ref 13.0–18.0)
MCH: 31.2 pg (ref 26.0–34.0)
MCHC: 34.8 g/dL (ref 32.0–36.0)
Platelet: 168 10*3/uL (ref 150–440)
RDW: 12.2 % (ref 11.5–14.5)

## 2012-12-17 ENCOUNTER — Ambulatory Visit: Payer: Self-pay | Admitting: Unknown Physician Specialty

## 2013-01-15 ENCOUNTER — Other Ambulatory Visit: Payer: Self-pay

## 2013-01-30 ENCOUNTER — Encounter: Payer: Self-pay | Admitting: Internal Medicine

## 2013-03-25 ENCOUNTER — Encounter: Payer: Self-pay | Admitting: Internal Medicine

## 2013-03-25 ENCOUNTER — Ambulatory Visit (INDEPENDENT_AMBULATORY_CARE_PROVIDER_SITE_OTHER): Payer: 59 | Admitting: Internal Medicine

## 2013-03-25 VITALS — BP 108/70 | HR 60 | Temp 97.6°F | Resp 16 | Wt 152.0 lb

## 2013-03-25 DIAGNOSIS — Z8719 Personal history of other diseases of the digestive system: Secondary | ICD-10-CM

## 2013-03-25 DIAGNOSIS — J302 Other seasonal allergic rhinitis: Secondary | ICD-10-CM | POA: Insufficient documentation

## 2013-03-25 DIAGNOSIS — J309 Allergic rhinitis, unspecified: Secondary | ICD-10-CM

## 2013-03-25 DIAGNOSIS — R42 Dizziness and giddiness: Secondary | ICD-10-CM

## 2013-03-25 DIAGNOSIS — H6123 Impacted cerumen, bilateral: Secondary | ICD-10-CM

## 2013-03-25 DIAGNOSIS — Z8711 Personal history of peptic ulcer disease: Secondary | ICD-10-CM

## 2013-03-25 DIAGNOSIS — H6120 Impacted cerumen, unspecified ear: Secondary | ICD-10-CM

## 2013-03-25 DIAGNOSIS — R7989 Other specified abnormal findings of blood chemistry: Secondary | ICD-10-CM

## 2013-03-25 MED ORDER — FEXOFENADINE-PSEUDOEPHED ER 180-240 MG PO TB24: 1.0000 | ORAL_TABLET | Freq: Every day | ORAL | Status: DC

## 2013-03-25 MED ORDER — FLUTICASONE PROPIONATE 50 MCG/ACT NA SUSP: 2.0000 | Freq: Every day | NASAL | Status: DC

## 2013-03-25 NOTE — Patient Instructions (Addendum)
You are suffering from allergic rhinitis .  The post nasal drip is causing your sore throat.  Change your antihistamine to Allegra D  (rx sent) and add a steroid nasal spray (also sent )  Flush your sinuses twice daily with Simply saline nasal spray. Wear your mask whenever you are working outside   Use benadryl 25 mg every 8 hours for the dripping nose    if you develop facial pain or a fever (temp  > 100.4) ,  Green nasal discharge,   Call for an antibiotic.  Your ears are completely clogged with wax.  Please get some Debrox and use it e very night to soften ear wax  We will make you an RN appt to have them irrigated next week

## 2013-03-25 NOTE — Progress Notes (Signed)
Patient ID: Christian Montgomery, male   DOB: 09-28-1991, 21 y.o.   MRN: [id number]   Patient Active Problem List  Diagnosis  . Abdominal pain of multiple sites  . Acute gastric ulcer without hemorrhage or perforation and without obstruction  . Elevated liver function tests  . Dizziness  . Constipation - functional  . Allergic rhinitis  . Personal history of gastric ulcer    Subjective:  CC:   Chief Complaint  Patient presents with  . Acute Visit    sinus/ itchy eyes runny nose    HPI:   Christian Montgomery a 22 y.o. male who presents with 1) Runny nose , eyes watering and itching,  Sinuses getting congested every night.  Symptoms recurring daily for past week, despite using zyrtec daily.  Environmental exposure to pollen and other plants because he has been mowing yards daily.  He has been  wearing a mask but not consistently .     Has had subjective fevers and chills for the past few days.  No sinus pain,  No purulent drainage. Occasional cough but not at night .  Some wheezing in the evenings before bedtime.  improves with lying down   2) For the past couple of months been having episodes of feeling light headed and shaky when he goes out in the sun and heat, but the symptoms have aso occurred in the winter from sun exposure without heat .  No history of fainting.    Past Medical History  Diagnosis Date  . Gastric ulcer with obstruction April 2013    hosp [facility] , [facility]    Past Surgical History  Procedure Laterality Date  . Cosmetic surgery         The following portions of the patient's history were reviewed and updated as appropriate: Allergies, current medications, and problem list.    Review of Systems:   Patient denies headache, fevers, malaise, unintentional weight loss, skin rash, eye pain, sinus congestion and sinus pain, sore throat, dysphagia,  hemoptysis , cough, dyspnea, wheezing, chest pain, palpitations, orthopnea, edema, abdominal pain, nausea, melena, diarrhea,  constipation, flank pain, dysuria, hematuria, urinary  Frequency, nocturia, numbness, tingling, seizures,  Focal weakness, Loss of consciousness,  Tremor, insomnia, depression, anxiety, and suicidal ideation.     History   Social History  . Marital Status: Single    Spouse Name: N/A    Number of Children: N/A  . Years of Education: N/A   Occupational History  . Not on file.   Social History Main Topics  . Smoking status: Never Smoker   . Smokeless tobacco: Never Used  . Alcohol Use: No  . Drug Use: No  . Sexually Active: No   Other Topics Concern  . Not on file   Social History Narrative  . No narrative on file    Objective:  BP 108/70  Pulse 60  Temp(Src) 97.6 F (36.4 C) (Oral)  Resp 16  Wt 152 lb (68.947 kg)  BMI 19.78 kg/m2  SpO2 98%  General appearance: alert, cooperative and appears stated age Ears: TMs completely obscured by cerumen impaction Throat: lips, mucosa, and tongue normal; teeth and gums normal. Tonsillar pillars slightly red  Neck: no adenopathy, no carotid bruit, supple, symmetrical, trachea midline and thyroid not enlarged, symmetric, no tenderness/mass/nodules Back: symmetric, no curvature. ROM normal. No CVA tenderness. Lungs: clear to auscultation bilaterally Heart: regular rate and rhythm, S1, S2 normal, no murmur, click, rub or gallop Abdomen: soft, non-tender; bowel sounds normal; no  masses,  no organomegaly Pulses: 2+ and symmetric Skin: Skin color, texture, turgor normal. No rashes or lesions Lymph nodes: Cervical, supraclavicular, and axillary nodes normal.  Assessment and Plan:  Allergic rhinitis Not controlled with zyrtec.  Adding steroid nasal spray ,  Change antihistamine to allegra d for the congestion, and qhs benadryl for the runny nose.  No signs of bacterial infection by history or exam, but advised to call if he developed specific symptoms.   Dizziness Brought on by light exposure and/or heat.  Patient reminded to wear  sun glasses  And stay well hydrated with electrolyte replacement drinks.   Personal history of gastric ulcer Resolved by repeat EGD in Dec 2013.  Continue daily protonix and avoid NSAIDs.  Will need to have his B12 level checked periodically.   Cerumen impaction Advised to use Debrox gtt in each ear for a few days and return for RN visit to have cerumen removed with irrigation.   Gilbert's disease Patient was referred to [facility] hepatology for assessment of elevated LFTS and dr. Iva Boop has ruled out Wilson's Disease. No liver biopsy, hepatitis vaccines ,  Or further testing was recommened at this time. The LFT elevation has been attributed  to Gilbert's and NASH.   Updated Medication List Outpatient Encounter Prescriptions as of 03/25/2013  Medication Sig Dispense Refill  . Multiple Vitamin (MULTIVITAMIN) tablet Take 1 tablet by mouth daily.      . pantoprazole (PROTONIX) 40 MG tablet Take 40 mg by mouth 2 (two) times daily.      . fexofenadine-pseudoephedrine (ALLEGRA-D 24 HOUR) 180-240 MG per 24 hr tablet Take 1 tablet by mouth daily.  30 tablet  2  . fluticasone (FLONASE) 50 MCG/ACT nasal spray Place 2 sprays into the nose daily.  16 g  6  . sucralfate (CARAFATE) 1 GM/10ML suspension Take 10 mLs (1 g total) by mouth 4 (four) times daily -  with meals and at bedtime.  420 mL  1  . [DISCONTINUED] dexlansoprazole (DEXILANT) 60 MG capsule Take 1 capsule (60 mg total) by mouth daily.  30 capsule  3   No facility-administered encounter medications on file as of 03/25/2013.

## 2013-03-27 ENCOUNTER — Encounter: Payer: Self-pay | Admitting: Internal Medicine

## 2013-03-27 DIAGNOSIS — H6120 Impacted cerumen, unspecified ear: Secondary | ICD-10-CM | POA: Insufficient documentation

## 2013-03-27 NOTE — Assessment & Plan Note (Signed)
Not controlled with zyrtec.  Adding steroid nasal spray ,  Change antihistamine to allegra d for the congestion, and qhs benadryl for the runny nose.  No signs of bacterial infection by history or exam, but advised to call if he developed specific symptoms.

## 2013-03-27 NOTE — Assessment & Plan Note (Signed)
Brought on by light exposure and/or heat.  Patient reminded to wear sun glasses  And stay well hydrated with electrolyte replacement drinks.

## 2013-03-27 NOTE — Assessment & Plan Note (Signed)
Advised to use Debrox gtt in each ear for a few days and return for RN visit to have cerumen removed with irrigation.

## 2013-03-27 NOTE — Assessment & Plan Note (Signed)
Patient was referred to [facility] hepatology for assessment of elevated LFTS and dr. Iva Boop has ruled out Wilson's Disease. No liver biopsy, hepatitis vaccines ,  Or further testing was recommened at this time. The LFT elevation has been attributed  to Gilbert's and NASH.

## 2013-03-27 NOTE — Assessment & Plan Note (Addendum)
Resolved by repeat EGD in Dec 2013.  Continue daily protonix and avoid NSAIDs.  Will need to have his B12 level checked periodically.

## 2013-04-13 ENCOUNTER — Encounter: Payer: Self-pay | Admitting: Internal Medicine

## 2013-04-27 IMAGING — US ABDOMEN ULTRASOUND
1 series · 13 of 25 positions shown · non-contrast
Comparison: none

REASON FOR EXAM: Elevated LFTs Abd pain persistent
COMMENTS:

PROCEDURE:     US  - US ABDOMEN GENERAL SURVEY  - September 28, 2012  [DATE]
RESULT:

[Series 1: abdomen ultrasound · 0.15mm/px · 13 of 88 slices shown]
[im 1/88]
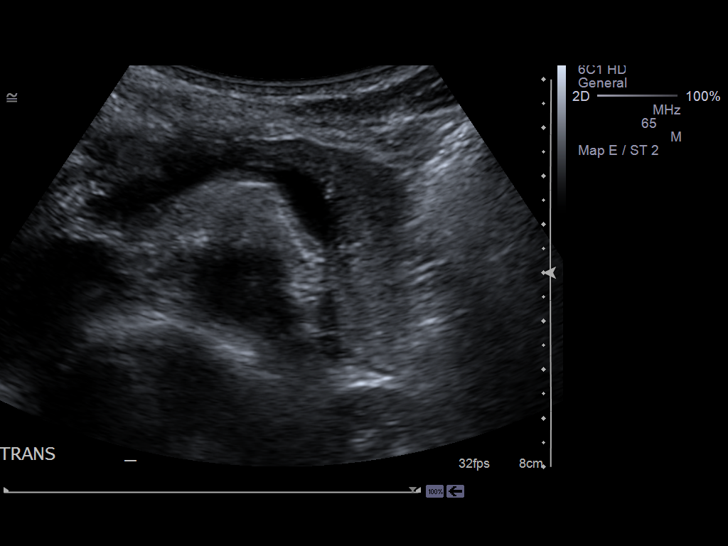
[im 8/88]
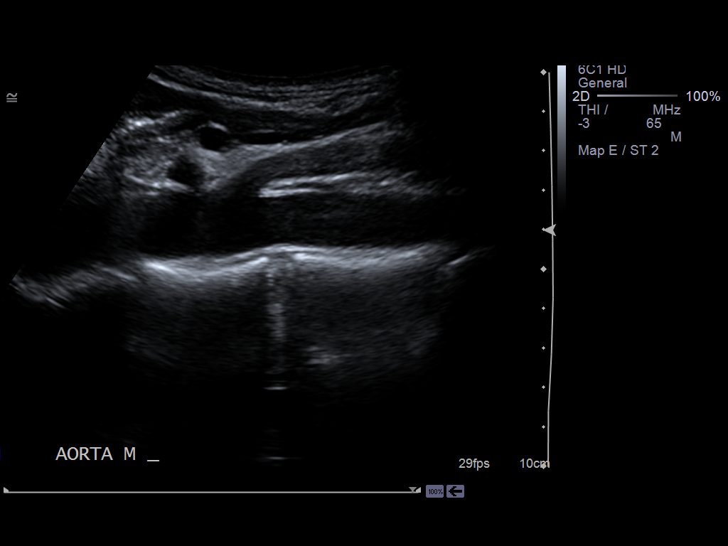
[im 15/88]
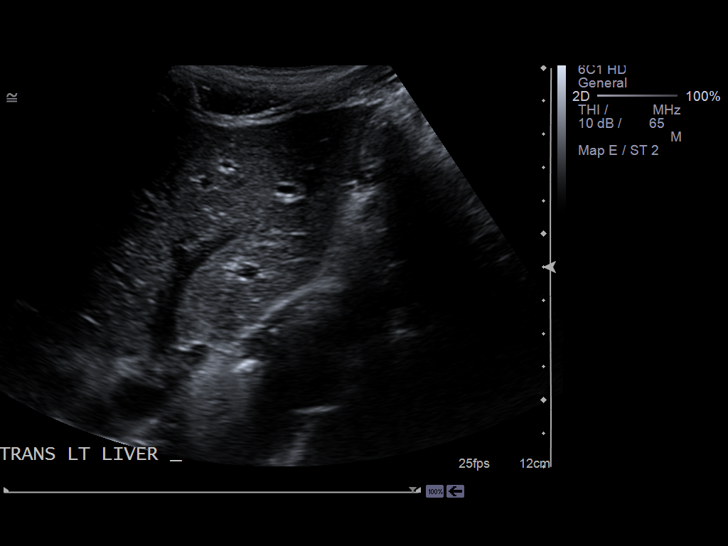
[im 22/88]
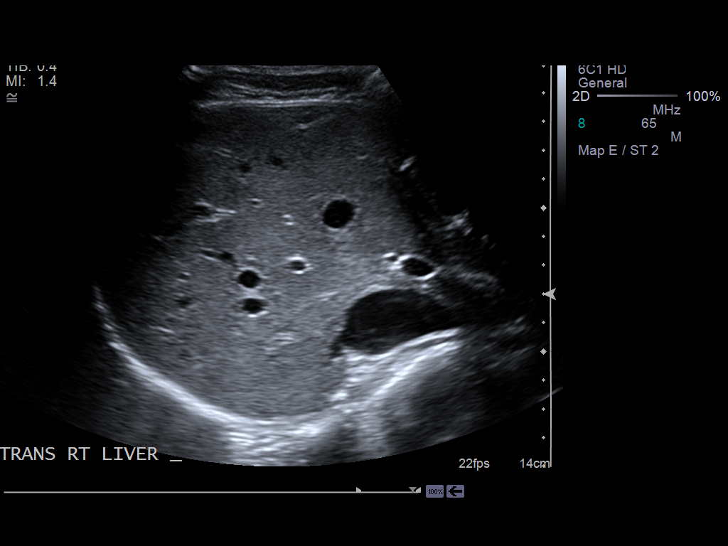
[im 30/88]
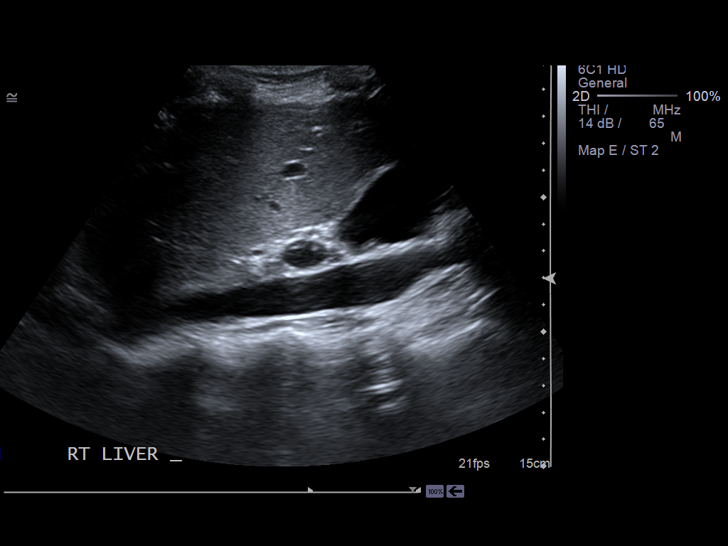
[im 37/88]
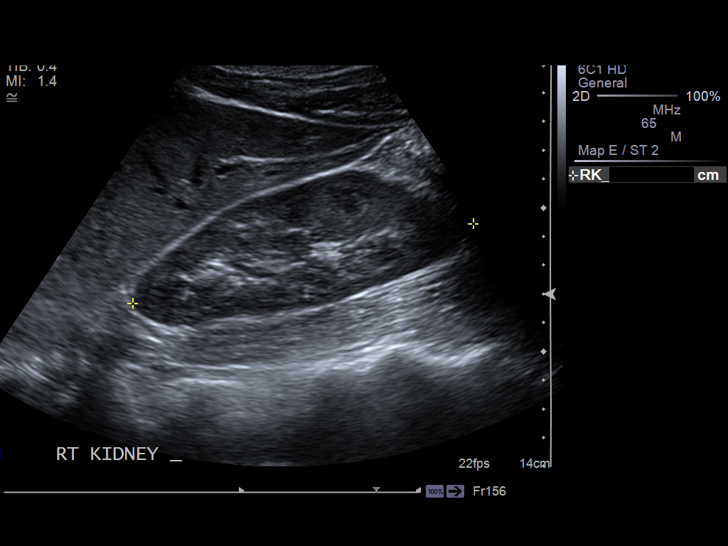
[im 44/88]
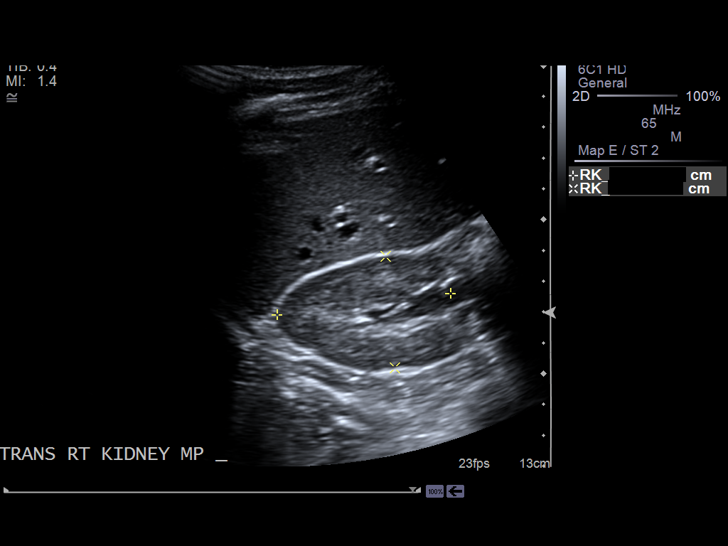
[im 51/88]
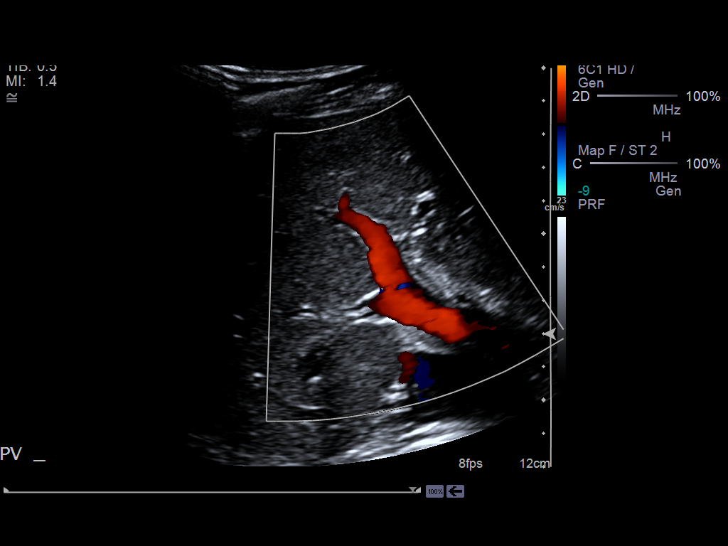
[im 59/88]
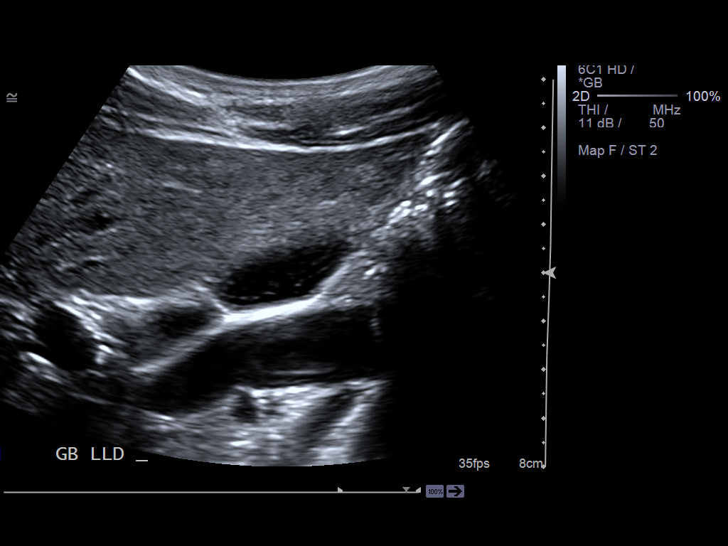
[im 66/88]
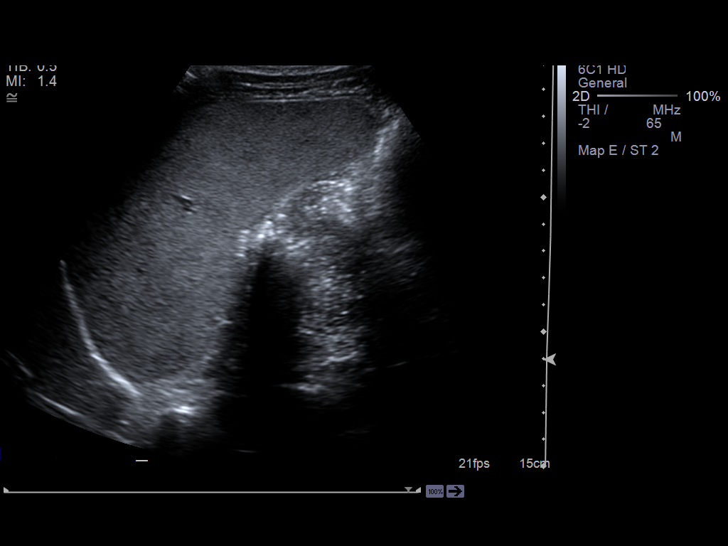
[im 73/88]
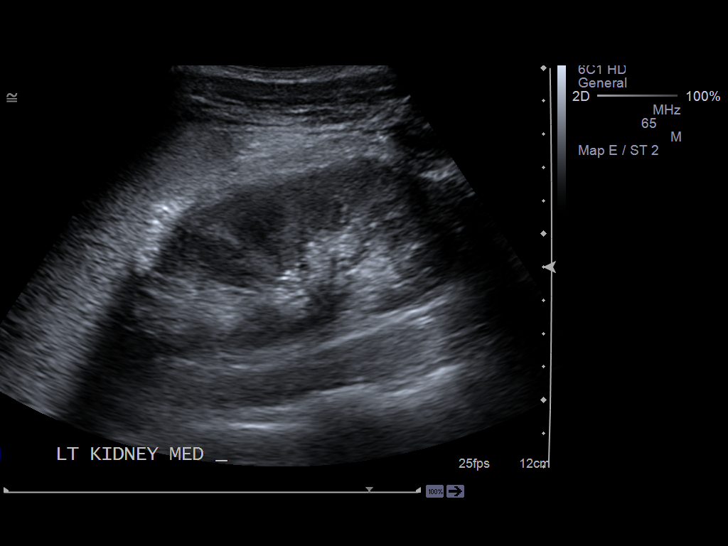
[im 80/88]
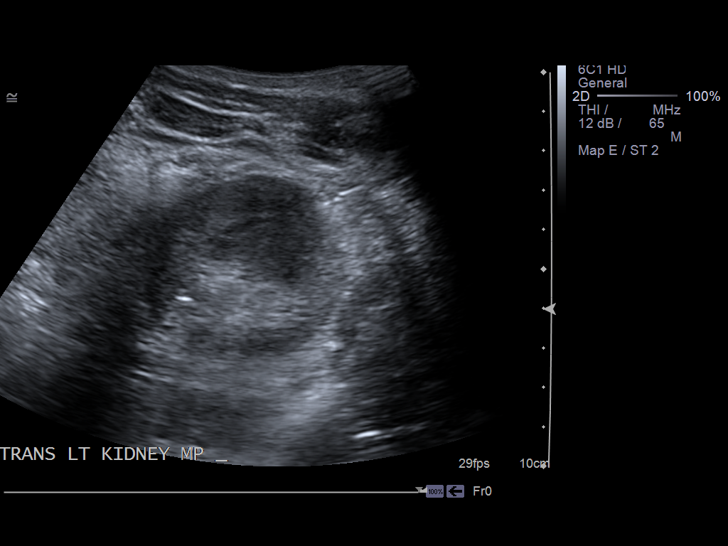
[im 88/88]
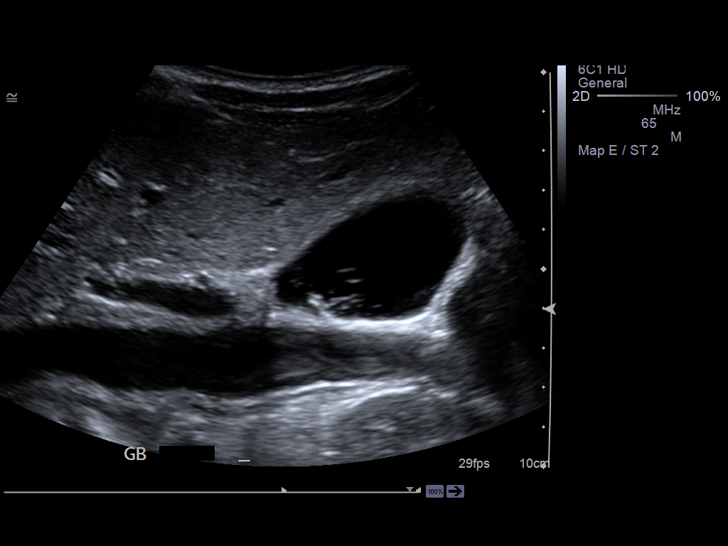

[13 of 25 positions shown; findings below may reference images not displayed]

FINDINGS: The liver demonstrates a homogeneous echotexture and measures
16.18 cm which, considering the patient's body habitus, is within normal
limits. Hepatopetal flow is identified within the portal vein. The aorta and
IVC are unremarkable. The pancreas is unremarkable. The spleen demonstrates
a homogeneous echotexture and measures 13.79 cm in longitudinal dimension
and also considering the patient's body habitus is within normal limits.
Evaluation of the gallbladder demonstrates small echogenic foci within the
lumen of the gallbladder. These likely represent small cholesterol crystals.
No evidence of significant gallstones is identified. Alternatively, the
findings within the gallbladder may represent echogenic components of sludge
within the gallbladder. There is no evidence of pericholecystic fluid,
gallbladder wall thickening or sonographic Murphy's sign. The gallbladder
wall measures 1.5 mm in thickness and the common bile duct 2.2 mm in
diameter.

The right kidney measures 12.17 x 5.67 x 3.63 cm and the left 12.14 x 4.73 x
4.89 cm. There is appropriate corticomedullary differentiation without
evidence of hydronephrosis, masses or calculi.
IMPRESSION: 1.     Likely small cholesterol crystals within the gallbladder lumen
without sonographic evidence of cholecystitis.
2.     The liver and splenic findings are likely within normal limits
considering the patient's body habitus. The patient is tall in stature.
3.     No further focal or acute abnormality identified.

## 2013-05-18 IMAGING — NM NUCLEAR MEDICINE HEPATOHBILIARY INCLUDE GB
3 series · 22 of 22 positions shown · non-contrast
Comparison: none

REASON FOR EXAM: Abd pain acute RUQ Elevated Bilirubin Normal US abd
COMMENTS:
TECHNIQUE: Following the uneventful intravenous infusion of
radiopharmaceutical, dynamic anterior regional imaging was obtained over the
liver for 70 minutes.

[Series 1000: gallbladder statics · 4.80mm/px · 10 of 10 slices shown]
[im 1/10]
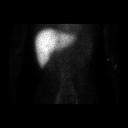
[im 2/10]
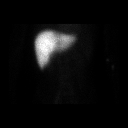
[im 3/10]
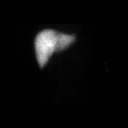
[im 4/10]
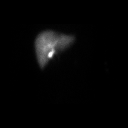
[im 5/10]
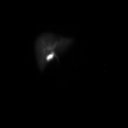
[im 6/10]
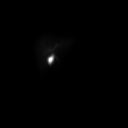
[im 7/10]
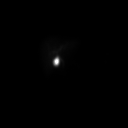
[im 8/10]
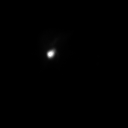
[im 9/10]
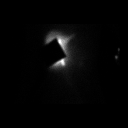
[im 10/10]
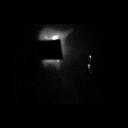

[Series 1000: gallbladder dynamic (results) · 4.80mm/px · 6 of 60 frames shown]
[frame 6/60]
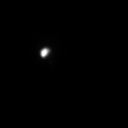
[frame 16/60]
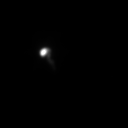
[frame 26/60]
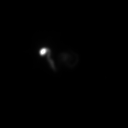
[frame 36/60]
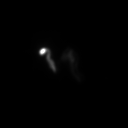
[frame 46/60]
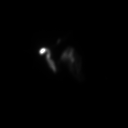
[frame 56/60]
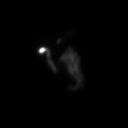

[Series 1000: gallbladder dynamic · 4.80mm/px · 6 of 60 frames shown]
[frame 6/60]
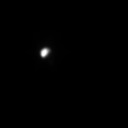
[frame 16/60]
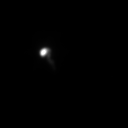
[frame 26/60]
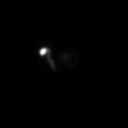
[frame 36/60]
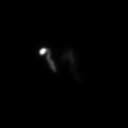
[frame 46/60]
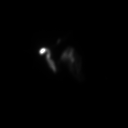
[frame 56/60]
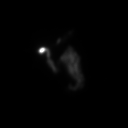

[22 of 22 positions shown; findings below may reference images not displayed]

PROCEDURE:     KNM - KNM HEPATO W/GB EJECT FRACTION  - October 19, 2012 [DATE]

RESULT:     Comparison: None.

Radiopharmaceutical: 8.36 mCi Ec-TTm labeled Choletec was administered
intravenously. Once the gallbladder had accumulated tracer, the patient was
given CCK intravenously per standard protocol.
FINDINGS: There is relative homogeneous radiotracer distribution in the liver.
Radiotracer activity is present within the gallbladder at 15 minutes. This
progressed over the remainder of the study. There is subtle activity in the
region of the small bowel at 60-70 minutes. Activity was also demonstrated
in the small bowel after the administration of CCK.

When gallbladder filling was complete, the patient was given an infusion of
1.36 mcg CCK over 30 minutes. At 30 minutes, the total ejection fraction was
73%, which is within normal limits. The patient had no symptoms during the
CCK administration.
IMPRESSION: 1. The common bile duct and cystic duct are patent.
2. Gallbladder ejection fraction is normal.

[REDACTED]

## 2013-05-23 ENCOUNTER — Ambulatory Visit: Payer: 59 | Admitting: Internal Medicine

## 2013-05-23 ENCOUNTER — Encounter: Payer: Self-pay | Admitting: Internal Medicine

## 2013-05-26 ENCOUNTER — Ambulatory Visit: Payer: 59 | Admitting: Internal Medicine

## 2013-05-27 ENCOUNTER — Ambulatory Visit (INDEPENDENT_AMBULATORY_CARE_PROVIDER_SITE_OTHER): Payer: 59 | Admitting: Internal Medicine

## 2013-05-27 ENCOUNTER — Encounter: Payer: Self-pay | Admitting: Internal Medicine

## 2013-05-27 VITALS — BP 112/76 | HR 89 | Temp 98.2°F | Resp 14 | Wt 151.0 lb

## 2013-05-27 DIAGNOSIS — R42 Dizziness and giddiness: Secondary | ICD-10-CM

## 2013-05-27 DIAGNOSIS — E162 Hypoglycemia, unspecified: Secondary | ICD-10-CM

## 2013-05-27 DIAGNOSIS — H6123 Impacted cerumen, bilateral: Secondary | ICD-10-CM

## 2013-05-27 DIAGNOSIS — H6120 Impacted cerumen, unspecified ear: Secondary | ICD-10-CM

## 2013-05-27 LAB — HEMOGLOBIN A1C
Hgb A1c MFr Bld: 5.4 % (ref <5.7)
Mean Plasma Glucose: 108 mg/dL (ref <117)

## 2013-05-27 NOTE — Progress Notes (Signed)
Patient ID: Christian Montgomery, male   DOB: 24-Jul-1991, 24 y.o.   MRN: [id number]

## 2013-05-27 NOTE — Patient Instructions (Addendum)
Your dizzy spells may be coming from too much sugar in your diet.  too much sugar causes you body to pump out insulin , and when your sugar level drops,  You feel dizzy  I have told you this before, at your last visit    Please stop drinking sweet tea, PowerAde and regular sodas. Switch to Powerade Zero,  G2  (gatorade without all the sugar)  or  Water!!!

## 2013-05-27 NOTE — Assessment & Plan Note (Addendum)
Bilateral,  With loss of hearing noted by patient when he tried to use Debrox and a q tip Impaction and hearing loss were resolved with flushing today

## 2013-05-28 LAB — COMPREHENSIVE METABOLIC PANEL
Alkaline Phosphatase: 59 U/L (ref 39–117)
CO2: 29 mEq/L (ref 19–32)
Creat: 0.97 mg/dL (ref 0.50–1.35)
Glucose, Bld: 101 mg/dL — ABNORMAL HIGH (ref 70–99)
Total Bilirubin: 2 mg/dL — ABNORMAL HIGH (ref 0.3–1.2)

## 2013-05-29 ENCOUNTER — Encounter: Payer: Self-pay | Admitting: Internal Medicine

## 2013-05-29 NOTE — Assessment & Plan Note (Signed)
I suspect hyperglycemia with sudden drops due to overuse o sugared beverages.  Screening for DM,  Advised to drink water or G@ or Powerade Zero

## 2013-07-23 ENCOUNTER — Encounter: Payer: Self-pay | Admitting: Internal Medicine

## 2013-07-23 DIAGNOSIS — R55 Syncope and collapse: Secondary | ICD-10-CM

## 2013-08-15 ENCOUNTER — Encounter: Payer: Self-pay | Admitting: Cardiovascular Disease

## 2013-08-15 ENCOUNTER — Ambulatory Visit (INDEPENDENT_AMBULATORY_CARE_PROVIDER_SITE_OTHER): Payer: 59 | Admitting: Cardiovascular Disease

## 2013-08-15 VITALS — BP 112/74 | HR 64 | Ht 74.0 in | Wt 149.2 lb

## 2013-08-15 DIAGNOSIS — R55 Syncope and collapse: Secondary | ICD-10-CM

## 2013-08-15 DIAGNOSIS — R42 Dizziness and giddiness: Secondary | ICD-10-CM

## 2013-08-15 DIAGNOSIS — R000 Tachycardia, unspecified: Secondary | ICD-10-CM

## 2013-08-15 DIAGNOSIS — R079 Chest pain, unspecified: Secondary | ICD-10-CM

## 2013-08-15 NOTE — Progress Notes (Signed)
HPI  This is a 22 year old man who was performed by Dr. Darrick Montgomery for evaluation of dizziness. He has no previous cardiac history. He was hospitalized in April of 2013 at [facility] for gastric distention due to a large ulcer thought to be NSAIDs-induced. He has abnormal liver function tests and suspected of having Gilbert's disease. He reports having an abnormal echocardiogram in the past done at Dr. Carron Brazen office. He had an echocardiogram done at [facility] doing his hospitalization which showed normal LV systolic function with no significant structural abnormalities. He has been having intermittent episodes of palpitations and dizziness. The dizziness can happen with standing up or other positions. He feels that his heart starts going fast followed by dizziness and presyncope. He never had a full syncopal episode. He complains of chronic chest pain. Initially some of these episodes were thought to be due to hypoglycemia. However, his blood sugar has been okay during these episodes. He feels that his heart can be slow and then goes fast suddenly. These episodes happen on average of once to twice a week. The dizziness is more frequent than that.  No Known Allergies   Current Outpatient Prescriptions on File Prior to Visit  Medication Sig Dispense Refill  . fexofenadine-pseudoephedrine (ALLEGRA-D 24 HOUR) 180-240 MG per 24 hr tablet Take 1 tablet by mouth daily.  30 tablet  2  . pantoprazole (PROTONIX) 40 MG tablet Take 40 mg by mouth 2 (two) times daily.       No current facility-administered medications on file prior to visit.     Past Medical History  Diagnosis Date  . Gastric ulcer with obstruction April 2013    hosp [facility] , [facility]     Past Surgical History  Procedure Laterality Date  . Cosmetic surgery       Family History  Problem Relation Age of Onset  . COPD Father   . Alcohol abuse Father   . Heart disease Father   . Diabetes Sister 44    gestational, not overweight   .  Diabetes Maternal Grandmother   . Cancer Maternal Grandmother     endometrial  . Early death Maternal Grandmother     cause unknown  . Heart failure Paternal Grandmother      History   Social History  . Marital Status: Single    Spouse Name: N/A    Number of Children: N/A  . Years of Education: N/A   Occupational History  . Not on file.   Social History Main Topics  . Smoking status: Never Smoker   . Smokeless tobacco: Never Used  . Alcohol Use: No  . Drug Use: No  . Sexual Activity: No   Other Topics Concern  . Not on file   Social History Narrative  . No narrative on file     ROS A 10 point review of system was performed. It's negative other than what is mentioned in the history of present illness.  PHYSICAL EXAM   BP 112/74  Pulse 64  Ht 6\' 2"  (1.88 m)  Wt 149 lb 4 oz (67.699 kg)  BMI 19.15 kg/m2 Constitutional: He is oriented to person, place, and time. He appears well-developed and well-nourished. No distress.  HENT: No nasal discharge.  Head: Normocephalic and atraumatic.  Eyes: Pupils are equal and round. Right eye exhibits no discharge. Left eye exhibits no discharge.  Neck: Normal range of motion. Neck supple. No JVD present. No thyromegaly present.  Cardiovascular: Normal rate, regular rhythm, normal  heart sounds and. Exam reveals no gallop and no friction rub. No murmur heard.  Pulmonary/Chest: Effort normal and breath sounds normal. No stridor. No respiratory distress. He has no wheezes. He has no rales. He exhibits no tenderness.  Abdominal: Soft. Bowel sounds are normal. He exhibits no distension. There is no tenderness. There is no rebound and no guarding.  Musculoskeletal: Normal range of motion. He exhibits no edema and no tenderness.  Neurological: He is alert and oriented to person, place, and time. Coordination normal.  Skin: Skin is warm and dry. No rash noted. He is not diaphoretic. No erythema. No pallor.  Psychiatric: He has a normal  mood and affect. His behavior is normal. Judgment and thought content normal.       EKG: Sinus bradycardia with no significant ST or T wave changes. Normal PR and QT intervals   ASSESSMENT AND PLAN

## 2013-08-15 NOTE — Assessment & Plan Note (Signed)
Patient is having frequent episodes of dizziness as well as palpitations. He had frequent presyncopal episodes but no frank syncope. Some of his symptoms are suggestive of tachyarrhythmia. Echocardiogram last year showed no significant structural abnormalities and normal LV systolic function. Some of his symptoms seems to be positional but not over the time. His heart rate did go up by 18 beats per minute from lying to standing position. There was no significant drop in blood pressure. Thus, he meets borderline criteria for postural orthostatic tachycardia syndrome. I agree with increased fluid intake which he was already instructed to do. I still feel that the tachyarrhythmia has to be excluded as an etiology to his symptoms. I will thus request a 21 day outpatient telemetry. His symptoms are not frequent enough to be captured by a Holter monitor.

## 2013-08-15 NOTE — Patient Instructions (Addendum)
Your physician has recommended that you wear an event monitor. Event monitors are medical devices that record the heart's electrical activity. Doctors most often [place] these monitors to diagnose arrhythmias. Arrhythmias are problems with the speed or rhythm of the heartbeat. The monitor is a small, portable device. You can wear one while you do your normal daily activities. This is usually used to diagnose what is causing palpitations/syncope (passing out).  (3 weeks outpatient telemetry).   Follow up after monitor.

## 2013-08-17 ENCOUNTER — Telehealth: Payer: Self-pay

## 2013-08-17 NOTE — Telephone Encounter (Signed)
Patient notified that someone from [employer] should be contacting him soon. The patient states [employer] did contact him today.

## 2013-08-17 NOTE — Telephone Encounter (Signed)
Pt states he has not heard anything about his holter monitor. Please call.

## 2013-08-19 ENCOUNTER — Telehealth: Payer: Self-pay

## 2013-08-19 DIAGNOSIS — I471 Supraventricular tachycardia: Secondary | ICD-10-CM

## 2013-08-19 NOTE — Telephone Encounter (Signed)
Received call with serious report from [facility]. Pt had SVT sustained x 5 mins this morning with rate up to 160. They spoke w/ pt who stated he had lightheadedness, chest pain and headache while he was mowing his yard. Symptoms resolved after rest. [facility] will fax report to our office.

## 2013-08-19 NOTE — Telephone Encounter (Signed)
Start Metoprolol Tartrate 25 mg bid prn for tachycardia.  Refer to Dr. Graciela Husbands.

## 2013-08-22 MED ORDER — METOPROLOL TARTRATE 25 MG PO TABS: 25.0000 mg | ORAL_TABLET | Freq: Two times a day (BID) | ORAL | Status: DC | PRN

## 2013-08-22 NOTE — Telephone Encounter (Signed)
Spoke w/ pt.  He is aware to pick up new rx for metoprolol tartrate 25 mg bid prn for tachycardia sent to [facility] in [place]. Due to computer appt conversion, unable to make appt w/ Dr. Graciela Husbands at this time.  Will call for appt.

## 2013-08-22 NOTE — Addendum Note (Signed)
Addended by: Marilynne Halsted on: 08/22/2013 08:43 AM   Modules accepted: Orders

## 2013-08-23 ENCOUNTER — Ambulatory Visit (INDEPENDENT_AMBULATORY_CARE_PROVIDER_SITE_OTHER): Payer: 59 | Admitting: *Deleted

## 2013-08-23 DIAGNOSIS — Z23 Encounter for immunization: Secondary | ICD-10-CM

## 2013-09-01 ENCOUNTER — Telehealth: Payer: Self-pay

## 2013-09-01 NOTE — Telephone Encounter (Signed)
Maritza called from [facility] w/ serious EKG. States that pt's monitor showed SVT w/ hr up to 167. Pt was contacted and stated that he was mowing his yard, felt lightheaded and had chest pain 6/10 for about 5 mins. Symptoms resolved after rest. [facility] will fax report.

## 2013-09-02 NOTE — Telephone Encounter (Signed)
I need to see the rhythm strips. Ask him to take Metoprolol 25 mg bid Please expedite appointment with Dr. Graciela Husbands.

## 2013-09-02 NOTE — Telephone Encounter (Signed)
I need to see

## 2013-09-02 NOTE — Telephone Encounter (Signed)
Spoke w/ pt.  He reports that he is feeling better today, but c/o occasional lightheadedness. States that he has been taking his metoprolol prn and feels better when he takes it.  He is agreeable to taking bid. Appt w/ Dr. Graciela Husbands moved to 09/06/13 @ 10:45.   Pt will need a note on that day, as he is scheduled to work.  [facility] has not faxed report over yet.  Will call to have strips sent over asap.

## 2013-09-06 ENCOUNTER — Encounter: Payer: Self-pay | Admitting: *Deleted

## 2013-09-06 ENCOUNTER — Ambulatory Visit: Payer: 59 | Admitting: Internal Medicine

## 2013-09-06 ENCOUNTER — Encounter: Payer: Self-pay | Admitting: Internal Medicine

## 2013-09-06 ENCOUNTER — Ambulatory Visit (INDEPENDENT_AMBULATORY_CARE_PROVIDER_SITE_OTHER): Payer: 59 | Admitting: Internal Medicine

## 2013-09-06 VITALS — BP 108/75 | HR 81 | Ht 74.0 in | Wt 150.0 lb

## 2013-09-06 DIAGNOSIS — I471 Supraventricular tachycardia, unspecified: Secondary | ICD-10-CM

## 2013-09-06 DIAGNOSIS — G901 Familial dysautonomia [Riley-Day]: Secondary | ICD-10-CM | POA: Insufficient documentation

## 2013-09-06 DIAGNOSIS — R55 Syncope and collapse: Secondary | ICD-10-CM

## 2013-09-06 DIAGNOSIS — R000 Tachycardia, unspecified: Secondary | ICD-10-CM

## 2013-09-06 DIAGNOSIS — I498 Other specified cardiac arrhythmias: Secondary | ICD-10-CM

## 2013-09-06 DIAGNOSIS — G909 Disorder of the autonomic nervous system, unspecified: Secondary | ICD-10-CM

## 2013-09-06 HISTORY — DX: Familial dysautonomia (riley-day): G90.1

## 2013-09-06 NOTE — Assessment & Plan Note (Signed)
The patient has objective evidence of postural orthostatic tachycardia with incremental heart rate rise of 50 beats per minute standing and somewhat interestingly associated with a 40 mm increase in blood pressure. This can be demonstration of hyper adrenergic form of POTS  There is no evidence of structural organic heart disease. I have reviewed this with this. He is also reviewed extensively the physiology of dysautonomia and POTS and the potential important role of water and salt repletion as primary therapy. We have also discussed the potential adverse effects stimulants either in the form of caffeine or decongestants and I recommended that he stop both.  I've given the websites for NDRF.org and POTS [employer].  We'll see him again in 10-12 weeks' time

## 2013-09-06 NOTE — Patient Instructions (Addendum)
Your physician has recommended you make the following change in your medication:  1) Stop Allegra-D 2) Stop Metoprolol  Your physician recommends that you schedule a follow-up appointment in: 10-12 weeks with Dr. Graciela Husbands.

## 2013-09-06 NOTE — Progress Notes (Signed)
ELECTROPHYSIOLOGY CONSULT NOTE  Patient ID: Christian Montgomery, MRN: [id number], DOB/AGE: 08-21-1991 22 y.o. Admit date: (Not on file) Date of Consult: 09/06/2013  Primary Physician: Duncan Dull, MD Primary Cardiologist: MA   Chief Complaint: dizziness    HPI Joshau Montgomery is a 22 y.o. male  Seen for dizziness exercise intolerance and presyncope.  Marland Kitchen Has a long-standing history of dizziness with prolonged standing and also noted and showers particularly warm ones being back many years; however, symptoms have worsened significantly in the last 6 months.  Exercises complicated by tachypalpitations shortness of breath and chest discomfort. Lightheadedness is associated with prolonged standong  Particularly in the heat, bending or showers.  He has had presyncope which is resolved by sitting; he has had no syncope.  His diet is deplete of salt as well as fluid.  Echocardiogram 1 year ago demonstrated normal left ventricular function.  Event recorder demonstrated no multiple episodes of sinus tachycardia associated symptoms, albethey  nonspecific       Past Medical History  Diagnosis Date  . Gastric ulcer with obstruction April 2013    hosp [facility]      Surgical History:  Past Surgical History  Procedure Laterality Date  . Cosmetic surgery       Home Meds: Prior to Admission medications   Medication Sig Start Date End Date Taking? Authorizing Provider  fexofenadine-pseudoephedrine (ALLEGRA-D 24 HOUR) 180-240 MG per 24 hr tablet Take 1 tablet by mouth daily. 03/25/13  Yes Sherlene Shams, MD  metoprolol tartrate (LOPRESSOR) 25 MG tablet Take 1 tablet (25 mg total) by mouth 2 (two) times daily as needed (for tachycardia). 08/22/13  Yes Iran Ouch, MD  pantoprazole (PROTONIX) 40 MG tablet Take 40 mg by mouth 2 (two) times daily.   Yes Historical Provider, MD      Allergies: No Known Allergies  History   Social History  . Marital Status: Single    Spouse Name:  N/A    Number of Children: N/A  . Years of Education: N/A   Occupational History  . Not on file.   Social History Main Topics  . Smoking status: Never Smoker   . Smokeless tobacco: Never Used  . Alcohol Use: No  . Drug Use: No  . Sexual Activity: No   Other Topics Concern  . Not on file   Social History Narrative  . No narrative on file     Family History  Problem Relation Age of Onset  . COPD Father   . Alcohol abuse Father   . Heart disease Father   . Diabetes Sister 12    gestational, not overweight   . Diabetes Maternal Grandmother   . Cancer Maternal Grandmother     endometrial  . Early death Maternal Grandmother     cause unknown  . Heart failure Paternal Grandmother      ROS:  Please see the history of present illness.     All other systems reviewed and negative.    Physical Exam: Blood pressure 113/75, pulse 66, height 6\' 2"  (1.88 m), weight 150 lb (68.04 kg). General: Well developed, well nourished male in no acute distress. Head: Normocephalic, atraumatic, sclera non-icteric, no xanthomas, nares are without discharge. EENT: normal Lymph Nodes:  none Back: without scoliosis/kyphosis, no CVA tendersness Neck: Negative for carotid bruits. JVD not elevated. Lungs: Clear bilaterally to auscultation without wheezes, rales, or rhonchi. Breathing is unlabored. Heart: RRR with S1 S2. No  murmur , rubs, or gallops appreciated.  Abdomen: Soft, non-tender, non-distended with normoactive bowel sounds. No hepatomegaly. No rebound/guarding. No obvious abdominal masses. Msk:  Strength and tone appear normal for age. Extremities: No clubbing or cyanosis. No edema.  Distal pedal pulses are 2+ and equal bilaterally. Skin: Warm and Dry Neuro: Alert and oriented X 3. CN III-XII intact Grossly normal sensory and motor function . Psych:  Responds to questions appropriately with a normal affect.      Labs: Cardiac Enzymes No results found for this basename: CKTOTAL, CKMB,  TROPONINI,  in the last 72 hours CBC Lab Results  Component Value Date   WBC 3.6* 09/21/2012   HGB 15.4 09/21/2012   HCT 45.7 09/21/2012   MCV 92.4 09/21/2012   PLT 173.0 09/21/2012   PROTIME: No results found for this basename: LABPROT, INR,  in the last 72 hours Chemistry No results found for this basename: NA, K, CL, CO2, BUN, CREATININE, CALCIUM, LABALBU, PROT, BILITOT, ALKPHOS, ALT, AST, GLUCOSE,  in the last 168 hours Lipids No results found for this basename: CHOL, HDL, LDLCALC, TRIG   BNP No results found for this basename: probnp   Miscellaneous No results found for this basename: DDIMER    Radiology/Studies:  No results found.  EKG: sinus rhythm at 66 intervals 15/10/39 Otherwise normal  Assessment and Plan:   Sherryl Manges

## 2013-09-15 ENCOUNTER — Ambulatory Visit: Payer: Self-pay | Admitting: Unknown Physician Specialty

## 2013-09-16 ENCOUNTER — Encounter: Payer: Self-pay | Admitting: *Deleted

## 2013-09-22 ENCOUNTER — Ambulatory Visit: Payer: 59 | Admitting: Internal Medicine

## 2013-09-29 ENCOUNTER — Ambulatory Visit (INDEPENDENT_AMBULATORY_CARE_PROVIDER_SITE_OTHER): Payer: 59 | Admitting: Cardiovascular Disease

## 2013-09-29 ENCOUNTER — Encounter: Payer: Self-pay | Admitting: Cardiovascular Disease

## 2013-09-29 VITALS — BP 106/70 | HR 62 | Ht 74.0 in | Wt 150.0 lb

## 2013-09-29 DIAGNOSIS — G901 Familial dysautonomia [Riley-Day]: Secondary | ICD-10-CM

## 2013-09-29 DIAGNOSIS — R079 Chest pain, unspecified: Secondary | ICD-10-CM

## 2013-09-29 DIAGNOSIS — G909 Disorder of the autonomic nervous system, unspecified: Secondary | ICD-10-CM

## 2013-09-29 NOTE — Progress Notes (Signed)
HPI  This is a 22 year old man who was visited today for a followup visit regarding POTS.  He was hospitalized in April of 2013 at [facility] for gastric distention due to a large ulcer thought to be NSAIDs-induced. He has abnormal liver function tests and suspected of having Gilbert's disease.  He had an echocardiogram done at [facility] duing his hospitalization which showed normal LV systolic function with no significant structural abnormalities. He was seen recently for dizziness and palpitations. Outpatient telemetry showed episodes of sinus tachycardia without significant arrhythmia. He was started on small dose metoprolol and was seen by Dr. Graciela Husbands. He was more bradycardic and thus metoprolol was discontinued. The patient's symptoms improved after increasing his sodium and fluid intake. He is feeling better.  No Known Allergies   Current Outpatient Prescriptions on File Prior to Visit  Medication Sig Dispense Refill  . pantoprazole (PROTONIX) 40 MG tablet Take 40 mg by mouth 2 (two) times daily.       No current facility-administered medications on file prior to visit.     Past Medical History  Diagnosis Date  . Gastric ulcer with obstruction April 2013    hosp [facility]     Past Surgical History  Procedure Laterality Date  . Cosmetic surgery       Family History  Problem Relation Age of Onset  . COPD Father   . Alcohol abuse Father   . Heart disease Father   . Diabetes Sister 81    gestational, not overweight   . Diabetes Maternal Grandmother   . Cancer Maternal Grandmother     endometrial  . Early death Maternal Grandmother     cause unknown  . Heart failure Paternal Grandmother      History   Social History  . Marital Status: Single    Spouse Name: N/A    Number of Children: N/A  . Years of Education: N/A   Occupational History  . Not on file.   Social History Main Topics  . Smoking status: Never Smoker   . Smokeless tobacco: Never Used  . Alcohol  Use: No  . Drug Use: No  . Sexual Activity: No   Other Topics Concern  . Not on file   Social History Narrative  . No narrative on file     ROS A 10 point review of system was performed. It's negative other than what is mentioned in the history of present illness.  PHYSICAL EXAM   There were no vitals taken for this visit. Constitutional: He is oriented to person, place, and time. He appears well-developed and well-nourished. No distress.  HENT: No nasal discharge.  Head: Normocephalic and atraumatic.  Eyes: Pupils are equal and round. Right eye exhibits no discharge. Left eye exhibits no discharge.  Neck: Normal range of motion. Neck supple. No JVD present. No thyromegaly present.  Cardiovascular: Normal rate, regular rhythm, normal heart sounds and. Exam reveals no gallop and no friction rub. No murmur heard.  Pulmonary/Chest: Effort normal and breath sounds normal. No stridor. No respiratory distress. He has no wheezes. He has no rales. He exhibits no tenderness.  Abdominal: Soft. Bowel sounds are normal. He exhibits no distension. There is no tenderness. There is no rebound and no guarding.  Musculoskeletal: Normal range of motion. He exhibits no edema and no tenderness.  Neurological: He is alert and oriented to person, place, and time. Coordination normal.  Skin: Skin is warm and dry. No rash noted. He is  not diaphoretic. No erythema. No pallor.  Psychiatric: He has a normal mood and affect. His behavior is normal. Judgment and thought content normal.       EKG: Sinus bradycardia with no significant ST or T wave changes. Normal PR and QT intervals   ASSESSMENT AND PLAN

## 2013-09-29 NOTE — Assessment & Plan Note (Signed)
He is doing well after increasing salt and fluid intake. Outpatient telemetry showed no significant arrhythmia other than sinus tachycardia. Echocardiogram in the past was normal. No further cardiac workup is indicated. The patient will be following up with Dr. Graciela Husbands and can followup with me as needed.

## 2013-09-29 NOTE — Patient Instructions (Signed)
Continue to follow up with Dr. Klein.   Follow up with me as needed.  

## 2013-09-30 DIAGNOSIS — R55 Syncope and collapse: Secondary | ICD-10-CM

## 2013-09-30 DIAGNOSIS — R000 Tachycardia, unspecified: Secondary | ICD-10-CM

## 2013-11-17 ENCOUNTER — Encounter: Payer: Self-pay | Admitting: Internal Medicine

## 2013-11-17 ENCOUNTER — Ambulatory Visit (INDEPENDENT_AMBULATORY_CARE_PROVIDER_SITE_OTHER): Payer: 59 | Admitting: Internal Medicine

## 2013-11-17 VITALS — BP 115/77 | HR 82 | Ht 74.0 in | Wt 149.0 lb

## 2013-11-17 DIAGNOSIS — G909 Disorder of the autonomic nervous system, unspecified: Secondary | ICD-10-CM

## 2013-11-17 DIAGNOSIS — G901 Familial dysautonomia [Riley-Day]: Secondary | ICD-10-CM

## 2013-11-17 NOTE — Progress Notes (Signed)
      Patient Care Team: Sherlene Shams, MD as PCP - General (Internal Medicine)   HPI  Christian Montgomery is a 22 y.o. male Seen in followup for POTS like symptoms  At last visit we have reviewed physiology and non pharmacological interventions  he is much better.  Past Medical History  Diagnosis Date  . Gastric ulcer with obstruction April 2013    h[facility]    Past Surgical History  Procedure Laterality Date  . Cosmetic surgery      Current Outpatient Prescriptions  Medication Sig Dispense Refill  . pantoprazole (PROTONIX) 40 MG tablet Take 40 mg by mouth 2 (two) times daily.       No current facility-administered medications for this visit.    No Known Allergies  Review of Systems negative except from HPI and PMH  Physical Exam BP 115/77  Pulse 82  Ht 6\' 2"  (1.88 m)  Wt 149 lb (67.586 kg)  BMI 19.12 kg/m2 Well developed and nourished in no acute distress HENT normal Neck supple with JVP-flat Clear Regular rate and rhythm, no murmurs or gallops Abd-soft with active BS No Clubbing cyanosis edema Skin-warm and dry A & Oriented  Grossly normal sensory and motor function     Assessment and  Plan

## 2013-11-17 NOTE — Assessment & Plan Note (Signed)
He is much better. We will continue current interventions. We will get together one last time in the early part of the summer to see how he is coping with the increased ambient temperature

## 2013-11-17 NOTE — Patient Instructions (Signed)
Your physician wants you to follow-up in:  6 months. You will receive a reminder letter in the mail two months in advance. If you don't receive a letter, please call our office to schedule the follow-up appointment.   

## 2013-11-21 ENCOUNTER — Ambulatory Visit (INDEPENDENT_AMBULATORY_CARE_PROVIDER_SITE_OTHER): Payer: 59 | Admitting: Internal Medicine

## 2013-11-21 ENCOUNTER — Encounter: Payer: Self-pay | Admitting: Internal Medicine

## 2013-11-21 VITALS — BP 120/90 | HR 85 | Temp 98.3°F | Wt 147.0 lb

## 2013-11-21 DIAGNOSIS — J069 Acute upper respiratory infection, unspecified: Secondary | ICD-10-CM

## 2013-11-21 MED ORDER — BENZONATATE 200 MG PO CAPS: 200.0000 mg | ORAL_CAPSULE | Freq: Two times a day (BID) | ORAL | Status: DC | PRN

## 2013-11-21 NOTE — Assessment & Plan Note (Addendum)
Symptoms and exam are most consistent with viral upper respiratory infection with cough. Rapid flu test negative today. Encouraged rest, adequate fluid intake, use of Tylenol or ibuprofen as needed for pain. Encouraged him to use Claritin-D 12 hour to help control symptoms of nasal congestion. Will start Tessalon as needed for cough. Letter written today to excuse him from work this week.Followup if any recurrent fever, chills, chest pain, worsening cough, or other concerns.

## 2013-11-21 NOTE — Progress Notes (Signed)
   Subjective:    Patient ID: Christian Montgomery, male    DOB: Sep 27, 1991, 22 y.o.   MRN: [id number]  HPI 22 year old male presents for acute visit complaining of four-day history of sore throat, nasal congestion, postnasal drip, nonproductive cough, general malaise. Symptoms first began on Friday with a sore throat, fever, chills, and general malaise with muscle aches. Over the last few days, fever has resolved. He continues to have clear nasal drainage and nonproductive cough. He has occasional mild shortness of breath but no persistent shortness of breath or chest pain. He tried taking over-the-counter cough syrup but does not like the taste of cough syrup. His mother has tried to persuade him to take Tylenol or ibuprofen but he has declined. He did have a flu shot this year.  Outpatient Prescriptions Prior to Visit  Medication Sig Dispense Refill  . pantoprazole (PROTONIX) 40 MG tablet Take 40 mg by mouth 2 (two) times daily.       No facility-administered medications prior to visit.   BP 120/90  Pulse 85  Temp(Src) 98.3 F (36.8 C) (Oral)  Wt 147 lb (66.679 kg)  SpO2 97%  Review of Systems  Constitutional: Positive for fever, chills and fatigue. Negative for activity change.  HENT: Positive for congestion, postnasal drip and rhinorrhea. Negative for ear discharge, ear pain, hearing loss, nosebleeds, sinus pressure, sneezing, sore throat, tinnitus, trouble swallowing and voice change.   Eyes: Negative for discharge, redness, itching and visual disturbance.  Respiratory: Positive for cough. Negative for chest tightness, shortness of breath, wheezing and stridor.   Cardiovascular: Negative for chest pain and leg swelling.  Musculoskeletal: Negative for arthralgias, myalgias, neck pain and neck stiffness.  Skin: Negative for color change and rash.  Neurological: Negative for dizziness, facial asymmetry and headaches.  Psychiatric/Behavioral: Negative for sleep disturbance.       Objective:     Physical Exam  Constitutional: He is oriented to person, place, and time. He appears well-developed and well-nourished. No distress.  HENT:  Head: Normocephalic and atraumatic.  Right Ear: External ear normal. A middle ear effusion is present.  Left Ear: External ear normal. A middle ear effusion is present.  Nose: Mucosal edema and rhinorrhea present.  Mouth/Throat: Posterior oropharyngeal erythema present. No oropharyngeal exudate.  Eyes: Conjunctivae and EOM are normal. Pupils are equal, round, and reactive to light. Right eye exhibits no discharge. Left eye exhibits no discharge. No scleral icterus.  Neck: Normal range of motion. Neck supple. No tracheal deviation present. No thyromegaly present.  Cardiovascular: Normal rate, regular rhythm and normal heart sounds.  Exam reveals no gallop and no friction rub.   No murmur heard. Pulmonary/Chest: Effort normal and breath sounds normal. No respiratory distress. He has no wheezes. He has no rales. He exhibits no tenderness.  Musculoskeletal: Normal range of motion. He exhibits no edema.  Lymphadenopathy:    He has no cervical adenopathy.  Neurological: He is alert and oriented to person, place, and time. No cranial nerve deficit. Coordination normal.  Skin: Skin is warm and dry. No rash noted. He is not diaphoretic. No erythema. No pallor.  Psychiatric: He has a normal mood and affect. His behavior is normal. Judgment and thought content normal.          Assessment & Plan:

## 2013-11-21 NOTE — Progress Notes (Signed)
Pre-visit discussion using our clinic review tool. No additional management support is needed unless otherwise documented below in the visit note.  

## 2013-11-21 NOTE — Patient Instructions (Signed)
Start Claritin D - 12hr during the day to help with congestion.  Use Tylenol or Ibuprofen as needed for pain or fever.  Start Tessalon 200mg  every 12 hours as needed for cough.  Follow up if any recurrent fever, worsening cough, shortness of breath, or other concerns.

## 2013-11-22 ENCOUNTER — Encounter: Payer: Self-pay | Admitting: Internal Medicine

## 2013-11-22 ENCOUNTER — Ambulatory Visit (INDEPENDENT_AMBULATORY_CARE_PROVIDER_SITE_OTHER): Payer: 59 | Admitting: Internal Medicine

## 2013-11-22 VITALS — BP 110/74 | HR 84 | Temp 98.2°F | Resp 12 | Wt 147.0 lb

## 2013-11-22 DIAGNOSIS — J029 Acute pharyngitis, unspecified: Secondary | ICD-10-CM

## 2013-11-22 DIAGNOSIS — J069 Acute upper respiratory infection, unspecified: Secondary | ICD-10-CM

## 2013-11-22 LAB — POCT INFLUENZA A/B
Influenza A, POC: NEGATIVE
Influenza B, POC: NEGATIVE

## 2013-11-22 MED ORDER — AMOXICILLIN-POT CLAVULANATE 875-125 MG PO TABS: 1.0000 | ORAL_TABLET | Freq: Two times a day (BID) | ORAL | Status: DC

## 2013-11-23 ENCOUNTER — Encounter: Payer: Self-pay | Admitting: Internal Medicine

## 2013-11-23 NOTE — Assessment & Plan Note (Addendum)
Symptoms still consistent with Viral URI. However given persistence of cough and chills, we discussed starting Augmentin tomorrow if symptoms are not improving and he was given Rx to take with him, as he may be developing acute bronchitis. He will email with update tomorrow.. Continue Tessalon for cough. Follow up in clinic if any worsening cough, shortness of breath, or recurrent fever or chills.

## 2013-11-23 NOTE — Progress Notes (Signed)
   Subjective:    Patient ID: Christian Montgomery, male    DOB: [birth year], 22 y.o.   MRN: [id number]  HPI 22 year old male with recent viral upper respiratory infection presents for followup because of persistent cough. He was seen yesterday and diagnosed with viral upper respiratory infection. Over the last 24 hours, he has been taking Tessalon with minimal improvement in cough. He has had some anterior chest wall pain with coughing. He denies any fever but has had some chills. His mother reports that he has not been resting but has been very active, preparing for the holidays. He denies shortness of breath.  Outpatient Prescriptions Prior to Visit  Medication Sig Dispense Refill  . benzonatate (TESSALON) 200 MG capsule Take 1 capsule (200 mg total) by mouth 2 (two) times daily as needed for cough.  40 capsule  0  . pantoprazole (PROTONIX) 40 MG tablet Take 40 mg by mouth 2 (two) times daily.       No facility-administered medications prior to visit.   BP 110/74  Pulse 84  Temp(Src) 98.2 F (36.8 C) (Oral)  Resp 12  Wt 147 lb (66.679 kg)  SpO2 98%  Review of Systems  Constitutional: Positive for chills and fatigue. Negative for fever and activity change.  HENT: Positive for congestion, postnasal drip and rhinorrhea. Negative for ear discharge, ear pain, hearing loss, nosebleeds, sinus pressure, sneezing, sore throat, tinnitus, trouble swallowing and voice change.   Eyes: Negative for discharge, redness, itching and visual disturbance.  Respiratory: Positive for cough. Negative for chest tightness, shortness of breath, wheezing and stridor.   Cardiovascular: Negative for chest pain and leg swelling.  Musculoskeletal: Negative for arthralgias, myalgias, neck pain and neck stiffness.  Skin: Negative for color change and rash.  Neurological: Negative for dizziness, facial asymmetry and headaches.  Psychiatric/Behavioral: Negative for sleep disturbance.       Objective:   Physical Exam    Constitutional: He is oriented to person, place, and time. He appears well-developed and well-nourished. No distress.  HENT:  Head: Normocephalic and atraumatic.  Right Ear: External ear normal.  Left Ear: External ear normal.  Nose: Mucosal edema and rhinorrhea present.  Mouth/Throat: Oropharynx is clear and moist. No oropharyngeal exudate.  Eyes: Conjunctivae and EOM are normal. Pupils are equal, round, and reactive to light. Right eye exhibits no discharge. Left eye exhibits no discharge. No scleral icterus.  Neck: Normal range of motion. Neck supple. No tracheal deviation present. No thyromegaly present.  Cardiovascular: Normal rate, regular rhythm and normal heart sounds.  Exam reveals no gallop and no friction rub.   No murmur heard. Pulmonary/Chest: Effort normal and breath sounds normal. No accessory muscle usage. Not tachypneic. No respiratory distress. He has no decreased breath sounds. He has no wheezes. He has no rhonchi. He has no rales. He exhibits no tenderness.  Musculoskeletal: Normal range of motion. He exhibits no edema.  Lymphadenopathy:    He has no cervical adenopathy.  Neurological: He is alert and oriented to person, place, and time. No cranial nerve deficit. Coordination normal.  Skin: Skin is warm and dry. No rash noted. He is not diaphoretic. No erythema. No pallor.  Psychiatric: He has a normal mood and affect. His behavior is normal. Judgment and thought content normal.          Assessment & Plan:

## 2013-11-25 ENCOUNTER — Encounter: Payer: Self-pay | Admitting: Internal Medicine

## 2013-11-28 ENCOUNTER — Encounter: Payer: Self-pay | Admitting: Internal Medicine

## 2013-11-28 ENCOUNTER — Other Ambulatory Visit: Payer: Self-pay | Admitting: Adult Health

## 2013-11-28 DIAGNOSIS — R059 Cough, unspecified: Secondary | ICD-10-CM

## 2013-11-28 DIAGNOSIS — R05 Cough: Secondary | ICD-10-CM

## 2013-12-02 ENCOUNTER — Encounter: Payer: Self-pay | Admitting: Internal Medicine

## 2013-12-02 ENCOUNTER — Ambulatory Visit (INDEPENDENT_AMBULATORY_CARE_PROVIDER_SITE_OTHER)
Admission: RE | Admit: 2013-12-02 | Discharge: 2013-12-02 | Disposition: A | Discharge status: Home / Self Care | Payer: 59 | Source: Ambulatory Visit | Attending: Internal Medicine | Admitting: Internal Medicine

## 2013-12-02 ENCOUNTER — Ambulatory Visit (INDEPENDENT_AMBULATORY_CARE_PROVIDER_SITE_OTHER): Payer: 59 | Admitting: Internal Medicine

## 2013-12-02 VITALS — BP 110/70 | HR 78 | Temp 98.3°F | Wt 148.0 lb

## 2013-12-02 DIAGNOSIS — R059 Cough, unspecified: Secondary | ICD-10-CM

## 2013-12-02 DIAGNOSIS — R05 Cough: Secondary | ICD-10-CM

## 2013-12-02 LAB — COMPREHENSIVE METABOLIC PANEL
ALK PHOS: 52 U/L (ref 39–117)
ALT: 18 U/L (ref 0–53)
AST: 20 U/L (ref 0–37)
Albumin: 4.4 g/dL (ref 3.5–5.2)
BUN: 13 mg/dL (ref 6–23)
CO2: 31 mEq/L (ref 19–32)
Calcium: 8.9 mg/dL (ref 8.4–10.5)
Chloride: 103 mEq/L (ref 96–112)
Creatinine, Ser: 0.9 mg/dL (ref 0.4–1.5)
GFR: 112.17 mL/min (ref >60.00)
Glucose, Bld: 91 mg/dL (ref 70–99)
POTASSIUM: 4.5 meq/L (ref 3.5–5.1)
SODIUM: 140 meq/L (ref 135–145)
TOTAL PROTEIN: 7.1 g/dL (ref 6.0–8.3)
Total Bilirubin: 1 mg/dL (ref 0.3–1.2)

## 2013-12-02 LAB — CBC WITH DIFFERENTIAL/PLATELET
Basophils Absolute: 0 10*3/uL (ref 0.0–0.1)
Basophils Relative: 0.7 % (ref 0.0–3.0)
EOS ABS: 0.1 10*3/uL (ref 0.0–0.7)
EOS PCT: 3.1 % (ref 0.0–5.0)
HCT: 41.5 % (ref 39.0–52.0)
Hemoglobin: 14.1 g/dL (ref 13.0–17.0)
Lymphocytes Relative: 29.1 % (ref 12.0–46.0)
Lymphs Abs: 0.9 10*3/uL (ref 0.7–4.0)
MCHC: 34 g/dL (ref 30.0–36.0)
MCV: 90.1 fl (ref 78.0–100.0)
MONO ABS: 0.3 10*3/uL (ref 0.1–1.0)
Monocytes Relative: 8.5 % (ref 3.0–12.0)
NEUTROS PCT: 58.6 % (ref 43.0–77.0)
Neutro Abs: 1.7 10*3/uL (ref 1.4–7.7)
PLATELETS: 204 10*3/uL (ref 150.0–400.0)
RBC: 4.61 Mil/uL (ref 4.22–5.81)
RDW: 12.4 % (ref 11.5–14.6)
WBC: 3 10*3/uL — AB (ref 4.5–10.5)

## 2013-12-02 MED ORDER — AZITHROMYCIN 250 MG PO TABS: ORAL_TABLET | ORAL | Status: DC

## 2013-12-02 MED ORDER — GUAIFENESIN-CODEINE 100-10 MG/5ML PO SYRP: 5.0000 mL | ORAL_SOLUTION | Freq: Two times a day (BID) | ORAL | Status: DC | PRN

## 2013-12-02 NOTE — Progress Notes (Signed)
Pre-visit discussion using our clinic review tool. No additional management support is needed unless otherwise documented below in the visit note.  

## 2013-12-02 NOTE — Assessment & Plan Note (Signed)
Persisting cough is most likely secondary to residual effects of viral upper respiratory infection. However, given severity of cough and posttussive emesis, will start azithromycin and send testing for Pertussis. CXR normal today. Encouraged rest, adequate fluid intake. Will start Robitussin AC for cough. Follow up 1 week and prn.

## 2013-12-02 NOTE — Progress Notes (Signed)
Subjective:    Patient ID: Christian Montgomery, male    DOB: August 17, 1991, 23 y.o.   MRN: [id number]  HPI 23 year old male presents to followup recent episode of cough. He was seen in early December 2014 complaining of general malaise, fever, chills, and cough most consistent with viral upper respiratory infection. Symptoms persisted and he returned to clinic. At that point, symptoms and exam are most consistent with bronchitis. He was treated with Augmentin and has almost completed his course. However, he continues to have productive cough and left-sided chest wall pain with coughing. Occasionally coughing episodes are so bad that he vomits. He denies any recurrent fever or chills. He occasionally feels short of breath. He has no known history of asthma. He is not a smoker. He has been taking Augmentin as prescribed and using Tessalon as needed for cough with minimal improvement.  Outpatient Prescriptions Prior to Visit  Medication Sig Dispense Refill  . amoxicillin-clavulanate (AUGMENTIN) 875-125 MG per tablet Take 1 tablet by mouth 2 (two) times daily.  20 tablet  0  . benzonatate (TESSALON) 200 MG capsule Take 1 capsule (200 mg total) by mouth 2 (two) times daily as needed for cough.  40 capsule  0  . pantoprazole (PROTONIX) 40 MG tablet Take 40 mg by mouth 2 (two) times daily.       No facility-administered medications prior to visit.   BP 110/70  Pulse 78  Temp(Src) 98.3 F (36.8 C) (Oral)  Wt 148 lb (67.132 kg)  SpO2 98%  Review of Systems  Constitutional: Negative for fever, chills, activity change and fatigue.  HENT: Negative for congestion, ear discharge, ear pain, hearing loss, nosebleeds, postnasal drip, rhinorrhea, sinus pressure, sneezing, sore throat, tinnitus, trouble swallowing and voice change.   Eyes: Negative for discharge, redness, itching and visual disturbance.  Respiratory: Positive for cough and shortness of breath. Negative for chest tightness, wheezing and stridor.     Cardiovascular: Positive for chest pain. Negative for leg swelling.  Musculoskeletal: Negative for arthralgias, myalgias, neck pain and neck stiffness.  Skin: Negative for color change and rash.  Neurological: Negative for dizziness, facial asymmetry and headaches.  Psychiatric/Behavioral: Negative for sleep disturbance.       Objective:   Physical Exam  Constitutional: He is oriented to person, place, and time. He appears well-developed and well-nourished. No distress.  HENT:  Head: Normocephalic and atraumatic.  Right Ear: External ear normal.  Left Ear: External ear normal.  Nose: Nose normal.  Mouth/Throat: Oropharynx is clear and moist. No oropharyngeal exudate.  Eyes: Conjunctivae and EOM are normal. Pupils are equal, round, and reactive to light. Right eye exhibits no discharge. Left eye exhibits no discharge. No scleral icterus.  Neck: Normal range of motion. Neck supple. No tracheal deviation present. No thyromegaly present.  Cardiovascular: Normal rate, regular rhythm and normal heart sounds.  Exam reveals no gallop and no friction rub.   No murmur heard. Pulmonary/Chest: Effort normal. No accessory muscle usage. Not tachypneic. No respiratory distress. He has decreased breath sounds in the left middle field. He has no wheezes. He has rhonchi in the left middle field. He has no rales. He exhibits no tenderness.  Musculoskeletal: Normal range of motion. He exhibits no edema.  Lymphadenopathy:    He has no cervical adenopathy.  Neurological: He is alert and oriented to person, place, and time. No cranial nerve deficit. Coordination normal.  Skin: Skin is warm and dry. No rash noted. He is not diaphoretic. No erythema. No pallor.  Psychiatric: He has a normal mood and affect. His behavior is normal. Judgment and thought content normal.          Assessment & Plan:

## 2013-12-07 LAB — BORDETELLA PERTUSSIS ANTIBODY
B pertussis IgA Ab, Quant: 2.4 U/mL — ABNORMAL HIGH (ref <=1.1)
B pertussis IgG Ab, Quant: 3 U/mL — ABNORMAL HIGH (ref 0.0–0.9)
B pertussis IgM Ab, Quant: 0.6 U/mL (ref <=1.1)

## 2013-12-10 LAB — BORDETELLA PERTUSSIS, IGG BY IB (RFLX)
B pertussis, IgG: POSITIVE
B. pertussis IgG IBA PT100: POSITIVE
B. pertussis IgG IBA PT: POSITIVE

## 2013-12-10 LAB — BORDETELLA PERTUSSIS, IGA BY IB
B PERTUSSIS IGA IBA PT: NEGATIVE
B pertussis, IgA: POSITIVE

## 2013-12-13 ENCOUNTER — Ambulatory Visit (INDEPENDENT_AMBULATORY_CARE_PROVIDER_SITE_OTHER): Payer: 59 | Admitting: Internal Medicine

## 2013-12-13 ENCOUNTER — Encounter: Payer: Self-pay | Admitting: Internal Medicine

## 2013-12-13 VITALS — BP 108/70 | HR 82 | Temp 98.1°F | Wt 149.0 lb

## 2013-12-13 DIAGNOSIS — A3700 Whooping cough due to Bordetella pertussis without pneumonia: Secondary | ICD-10-CM

## 2013-12-13 DIAGNOSIS — D72819 Decreased white blood cell count, unspecified: Secondary | ICD-10-CM

## 2013-12-13 LAB — CBC WITH DIFFERENTIAL/PLATELET
BASOS ABS: 0 10*3/uL (ref 0.0–0.1)
Basophils Relative: 0.7 % (ref 0.0–3.0)
EOS PCT: 2.6 % (ref 0.0–5.0)
Eosinophils Absolute: 0.1 10*3/uL (ref 0.0–0.7)
HCT: 41 % (ref 39.0–52.0)
Hemoglobin: 13.9 g/dL (ref 13.0–17.0)
LYMPHS ABS: 1 10*3/uL (ref 0.7–4.0)
LYMPHS PCT: 26.2 % (ref 12.0–46.0)
MCHC: 33.9 g/dL (ref 30.0–36.0)
MCV: 89.6 fl (ref 78.0–100.0)
MONOS PCT: 8 % (ref 3.0–12.0)
Monocytes Absolute: 0.3 10*3/uL (ref 0.1–1.0)
Neutro Abs: 2.4 10*3/uL (ref 1.4–7.7)
Neutrophils Relative %: 62.5 % (ref 43.0–77.0)
PLATELETS: 196 10*3/uL (ref 150.0–400.0)
RBC: 4.57 Mil/uL (ref 4.22–5.81)
RDW: 12.5 % (ref 11.5–14.6)
WBC: 3.8 10*3/uL — AB (ref 4.5–10.5)

## 2013-12-13 NOTE — Assessment & Plan Note (Signed)
Labs confirmed pertussis as cause of cough. Treated with Azithromycin. Symptoms gradually improving. Gave pt information on pertussis and typical course. Continue prn Tessalon and Codeine for cough. Follow up if any worsening symptoms, fever, dyspnea. Recheck 4 weeks.

## 2013-12-13 NOTE — Assessment & Plan Note (Signed)
Leukopenia noted on previous labs. WBC improved today.

## 2013-12-13 NOTE — Progress Notes (Signed)
Pre-visit discussion using our clinic review tool. No additional management support is needed unless otherwise documented below in the visit note.  

## 2013-12-13 NOTE — Patient Instructions (Signed)
Pertussis, Adult Pertussis (whooping cough) is an infection that causes severe and sudden coughing attacks. CAUSES  Pertussis is caused by bacteria. It is very contagious and spreads to others by the droplets sprayed in the air when an infected person talks, coughs, and sneezes. You may have caught pertussis from inhaling these droplets or from touching a surface where the droplets fell and then touching the mouth or nose. SYMPTOMS  Early during this infection, symptoms of pertussis are similar to those of the common cold. They include a runny nose, low fever, mild cough, and red, watery eyes. After 1 2 weeks the cold symptoms get better, but the cough worsens and severe and sudden coughing attacks frequently develop. During these attacks people may cough so hard that vomiting occurs. Over the next month to 6 weeks, the cough starts to get better, but it may take as long as 6 months for the cough to go away completely. DIAGNOSIS  Your caregiver will perform a physical exam. The caregiver may take a mucus sample from the nose and throat and a blood sample to help confirm the diagnosis. The caregiver may also take a chest X-ray. TREATMENT  Antibiotic medications are usually prescribed for this infection. Starting antibiotics quickly may help shorten the illness and make it less contagious. Antibiotics may also be prescribed for everyone living in the same household. Immunization may be recommended for those in the household at risk of developing pertussis. At-risk groups include:  Infants.  Those who have not had their full course of pertussis immunizations.  Those who were immunized but have not had their recent booster shot. Mild coughing may continue for months after the infection is treated from the remaining soreness and swelling (inflammation) in the lungs. HOME CARE INSTRUCTIONS   Take your antibiotics as directed. Finish them even if you start to feel better.  Do not take cough medication  unless prescribed by your caregiver. Coughing is a protective mechanism that helps keep colored mucus (sputum) and secretions from clogging breathing passages.  Stay away from those who are at risk of developing pertussis for the first 5 days of antibiotic treatment. If no antibiotics are prescribed, stay at home for the first 3 weeks you are coughing.  Do not go to work until you have been treated with antibiotics for 5 days. If no antibiotics are prescribed, do not go to work for the first 3 weeks you are coughing. Inform your workplace that you were diagnosed with pertussis.  Wash your hands often. Those living in the same household should also wash their hands often to avoid spreading the infection.  Avoid substances that may irritate the lungs, such as smoke, aerosols, or fumes. These substances may worsen your coughing.  If you are having a coughing attack:  Raise the head of your mattress to help clear sputum more easily and improve breathing.  Sit upright.  Use a cool mist humidifier at home to increase air moisture. This will soothe your cough and help loosen sputum. Do not use hot steam.  Rest as much as possible. Normal activity may be gradually resumed.  Drink enough fluids to keep your urine clear or pale yellow. PREVENTION  Pertussis can be prevented with a vaccine and later booster shots. The pertussis vaccine is usually given during childhood. Adults who were not previously vaccinated should be vaccinated as soon as possible. Adults who were previously vaccinated should talk to their caregivers about the need for a booster shot because immunity from the  vaccine decreases over time. All of the following persons should consider receiving a booster dose of pertussis, which is combined with tetanus and diphtheria (Tdap) vaccine:  Pregnant women during each pregnancy, preferably at 27 36 weeks of pregnancy (gestation).  All persons who have or will have close contact with an  infant aged less than 12 months. Infants are at highest risk for life-threatening complications from pertussis.  All health care personnel. SEEK MEDICAL CARE IF:   You have persistent vomiting.  You are not able to eat or drink fluids.  You do not seem to be improving. SEEK IMMEDIATE MEDICAL CARE IF:   Your face turns red or blue during a coughing attack.  You become unconscious after a coughing attack, even if only for a few moments.  Your breathing stops for a period of time (apnea).  You are restless or cannot sleep.  You are listless or sleeping too much.  You have a fever. MAKE SURE YOU:  Understand these instructions.   Will watch your condition.   Will get help right away if you are not doing well or get worse. Document Released: 03/14/2013 Document Reviewed: 03/14/2013 [facility] Patient Information 2014 [place], [place].

## 2013-12-13 NOTE — Progress Notes (Signed)
   Subjective:    Patient ID: Christian Montgomery, male    DOB: [birth year], 23 y.o.   MRN: [id number]  HPI 23 year old male presents for followup after recent upper respiratory infection with cough. He reports persistent symptoms of generally nonproductive cough have gradually improved. He notes spells of coughing with post-tussive emesis, however this has become less and less prominent. He denies fever, chills, dyspnea. He occasionally has aching pain in his chest with cough. He completed course of Azithromycin. No new concerns today.  Outpatient Encounter Prescriptions as of 12/13/2013  Medication Sig  . benzonatate (TESSALON) 200 MG capsule Take 1 capsule (200 mg total) by mouth 2 (two) times daily as needed for cough.  Marland Kitchen. guaiFENesin-codeine (ROBITUSSIN AC) 100-10 MG/5ML syrup Take 5 mLs by mouth 2 (two) times daily as needed for cough.  . pantoprazole (PROTONIX) 40 MG tablet Take 40 mg by mouth 2 (two) times daily.    BP 108/70  Pulse 82  Temp(Src) 98.1 F (36.7 C) (Oral)  Wt 149 lb (67.586 kg)  SpO2 99%   Review of Systems  Constitutional: Negative for fever, chills, activity change and fatigue.  HENT: Negative for congestion, ear discharge, ear pain, hearing loss, nosebleeds, postnasal drip, rhinorrhea, sinus pressure, sneezing, sore throat, tinnitus, trouble swallowing and voice change.   Eyes: Negative for discharge, redness, itching and visual disturbance.  Respiratory: Positive for cough. Negative for chest tightness, shortness of breath, wheezing and stridor.   Cardiovascular: Positive for chest pain. Negative for leg swelling.  Musculoskeletal: Negative for arthralgias, myalgias, neck pain and neck stiffness.  Skin: Negative for color change and rash.  Neurological: Negative for dizziness, facial asymmetry and headaches.  Psychiatric/Behavioral: Negative for sleep disturbance.       Objective:   Physical Exam  Constitutional: He is oriented to person, place, and time. He  appears well-developed and well-nourished. No distress.  HENT:  Head: Normocephalic and atraumatic.  Right Ear: External ear normal.  Left Ear: External ear normal.  Nose: Nose normal.  Mouth/Throat: Oropharynx is clear and moist. No oropharyngeal exudate.  Eyes: Conjunctivae and EOM are normal. Pupils are equal, round, and reactive to light. Right eye exhibits no discharge. Left eye exhibits no discharge. No scleral icterus.  Neck: Normal range of motion. Neck supple. No tracheal deviation present. No thyromegaly present.  Cardiovascular: Normal rate, regular rhythm and normal heart sounds.  Exam reveals no gallop and no friction rub.   No murmur heard. Pulmonary/Chest: Effort normal and breath sounds normal. No accessory muscle usage. Not tachypneic. No respiratory distress. He has no decreased breath sounds. He has no wheezes. He has no rhonchi. He has no rales. He exhibits no tenderness.  Musculoskeletal: Normal range of motion. He exhibits no edema.  Lymphadenopathy:    He has no cervical adenopathy.  Neurological: He is alert and oriented to person, place, and time. No cranial nerve deficit. Coordination normal.  Skin: Skin is warm and dry. No rash noted. He is not diaphoretic. No erythema. No pallor.  Psychiatric: He has a normal mood and affect. His behavior is normal. Judgment and thought content normal.          Assessment & Plan:

## 2014-01-12 ENCOUNTER — Encounter: Payer: Self-pay | Admitting: Internal Medicine

## 2014-01-12 ENCOUNTER — Ambulatory Visit (INDEPENDENT_AMBULATORY_CARE_PROVIDER_SITE_OTHER): Payer: 59 | Admitting: Internal Medicine

## 2014-01-12 VITALS — BP 118/78 | HR 61 | Temp 97.5°F | Resp 16 | Wt 150.8 lb

## 2014-01-12 DIAGNOSIS — Z13220 Encounter for screening for lipoid disorders: Secondary | ICD-10-CM

## 2014-01-12 DIAGNOSIS — D72819 Decreased white blood cell count, unspecified: Secondary | ICD-10-CM

## 2014-01-12 DIAGNOSIS — A3700 Whooping cough due to Bordetella pertussis without pneumonia: Secondary | ICD-10-CM

## 2014-01-12 DIAGNOSIS — Z8719 Personal history of other diseases of the digestive system: Secondary | ICD-10-CM

## 2014-01-12 DIAGNOSIS — Z8711 Personal history of peptic ulcer disease: Secondary | ICD-10-CM

## 2014-01-12 DIAGNOSIS — R5381 Other malaise: Secondary | ICD-10-CM

## 2014-01-12 DIAGNOSIS — A3790 Whooping cough, unspecified species without pneumonia: Secondary | ICD-10-CM

## 2014-01-12 DIAGNOSIS — R5383 Other fatigue: Secondary | ICD-10-CM

## 2014-01-12 LAB — COMPREHENSIVE METABOLIC PANEL
ALBUMIN: 4.4 g/dL (ref 3.5–5.2)
ALT: 16 U/L (ref 0–53)
AST: 20 U/L (ref 0–37)
Alkaline Phosphatase: 54 U/L (ref 39–117)
BUN: 13 mg/dL (ref 6–23)
CALCIUM: 9.1 mg/dL (ref 8.4–10.5)
CHLORIDE: 102 meq/L (ref 96–112)
CO2: 30 mEq/L (ref 19–32)
CREATININE: 0.8 mg/dL (ref 0.4–1.5)
GFR: 132.73 mL/min (ref >60.00)
Glucose, Bld: 82 mg/dL (ref 70–99)
POTASSIUM: 4.2 meq/L (ref 3.5–5.1)
Sodium: 138 mEq/L (ref 135–145)
Total Bilirubin: 3.2 mg/dL — ABNORMAL HIGH (ref 0.3–1.2)
Total Protein: 7 g/dL (ref 6.0–8.3)

## 2014-01-12 LAB — CBC WITH DIFFERENTIAL/PLATELET
BASOS ABS: 0 10*3/uL (ref 0.0–0.1)
BASOS PCT: 0.7 % (ref 0.0–3.0)
EOS ABS: 0.1 10*3/uL (ref 0.0–0.7)
Eosinophils Relative: 2.5 % (ref 0.0–5.0)
HCT: 44.3 % (ref 39.0–52.0)
HEMOGLOBIN: 14.5 g/dL (ref 13.0–17.0)
LYMPHS ABS: 1 10*3/uL (ref 0.7–4.0)
Lymphocytes Relative: 35.5 % (ref 12.0–46.0)
MCHC: 32.7 g/dL (ref 30.0–36.0)
MCV: 92.6 fl (ref 78.0–100.0)
MONO ABS: 0.2 10*3/uL (ref 0.1–1.0)
MONOS PCT: 8.5 % (ref 3.0–12.0)
NEUTROS ABS: 1.5 10*3/uL (ref 1.4–7.7)
Neutrophils Relative %: 52.8 % (ref 43.0–77.0)
Platelets: 146 10*3/uL — ABNORMAL LOW (ref 150.0–400.0)
RBC: 4.78 Mil/uL (ref 4.22–5.81)
RDW: 12.9 % (ref 11.5–14.6)
WBC: 2.8 10*3/uL — AB (ref 4.5–10.5)

## 2014-01-12 LAB — LIPID PANEL
Cholesterol: 118 mg/dL (ref 0–200)
HDL: 46.4 mg/dL (ref >39.00)
LDL Cholesterol: 66 mg/dL (ref 0–99)
Total CHOL/HDL Ratio: 3
Triglycerides: 29 mg/dL (ref 0.0–149.0)
VLDL: 5.8 mg/dL (ref 0.0–40.0)

## 2014-01-12 MED ORDER — PANTOPRAZOLE SODIUM 40 MG PO TBEC: 40.0000 mg | DELAYED_RELEASE_TABLET | Freq: Every day | ORAL | Status: DC

## 2014-01-12 NOTE — Progress Notes (Signed)
Patient ID: Christian Montgomery, male   DOB: November 29, 1991, 23 y.o.   MRN: [id number]  Patient Active Problem List   Diagnosis Date Noted  . Bordetella pertussis 12/13/2013  . Leukopenia 12/13/2013  . Dysautonomia/POTS 09/06/2013  . Allergic rhinitis 03/25/2013  . Constipation - functional 10/14/2012  . Gilbert's disease 09/22/2012  . Personal history of gastric ulcer 03/01/2012    Subjective:  CC:   Chief Complaint  Patient presents with  . Follow-up    4 week pertussis    HPI:   Christian Montgomery is a 23 y.o. male who presents for  Follow up on recent infection with pertussis diagnosed with serologies for pertutssis AB.  Marland Kitchen  Cough has resolved.  Mother is coughing now.  Sick contacts likely from working at [employer].  Had the TDap vaccine in 2013.,    History of gastric ulcer:  He has been taking protonix twice daily for a year, prescribed by Dr Mechele Collin.  No symptoms of recurrent abd pain .  Wants to know if he needs to continue bid. Dosing.  Not using NSAIds or asa.  Was sent to [facility] for second opinion on possible Wilson's disease.    Past Medical History  Diagnosis Date  . Gastric ulcer with obstruction April 2013    hosp [facility] , [facility]    Past Surgical History  Procedure Laterality Date  . Cosmetic surgery         The following portions of the patient's history were reviewed and updated as appropriate: Allergies, current medications, and problem list.    Review of Systems:   Patient denies headache, fevers, malaise, unintentional weight loss, skin rash, eye pain, sinus congestion and sinus pain, sore throat, dysphagia,  hemoptysis , cough, dyspnea, wheezing, chest pain, palpitations, orthopnea, edema, abdominal pain, nausea, melena, diarrhea, constipation, flank pain, dysuria, hematuria, urinary  Frequency, nocturia, numbness, tingling, seizures,  Focal weakness, Loss of consciousness,  Tremor, insomnia, depression, anxiety, and suicidal ideation.     History   Social History   . Marital Status: Single    Spouse Name: N/A    Number of Children: N/A  . Years of Education: N/A   Occupational History  . Not on file.   Social History Main Topics  . Smoking status: Never Smoker   . Smokeless tobacco: Never Used  . Alcohol Use: No  . Drug Use: No  . Sexual Activity: No   Other Topics Concern  . Not on file   Social History Narrative  . No narrative on file    Objective:  Filed Vitals:   01/12/14 1022  BP: 118/78  Pulse: 61  Temp: 97.5 F (36.4 C)  Resp: 16     General appearance: alert, cooperative and appears stated age Ears: normal TM's and external ear canals both ears Throat: lips, mucosa, and tongue normal; teeth and gums normal Neck: no adenopathy, no carotid bruit, supple, symmetrical, trachea midline and thyroid not enlarged, symmetric, no tenderness/mass/nodules Back: symmetric, no curvature. ROM normal. No CVA tenderness. Lungs: clear to auscultation bilaterally Heart: regular rate and rhythm, S1, S2 normal, no murmur, click, rub or gallop Abdomen: soft, non-tender; bowel sounds normal; no masses,  no organomegaly Pulses: 2+ and symmetric Skin: Skin color, texture, turgor normal. No rashes or lesions Lymph nodes: Cervical, supraclavicular, and axillary nodes normal.  Assessment and Plan:  Gilbert's disease Wilson's Disease ruled out reportedly with second opinion by [facility] liver specialist.   Personal history of gastric ulcer Recommended continued use if Protonix  once daily and avoid NSAIDs and aspirin   Bordetella pertussis Acute infection resolved.    Updated Medication List Outpatient Encounter Prescriptions as of 01/12/2014  Medication Sig  . pantoprazole (PROTONIX) 40 MG tablet Take 1 tablet (40 mg total) by mouth daily. In the mornign before breakfast  . [DISCONTINUED] pantoprazole (PROTONIX) 40 MG tablet Take 40 mg by mouth 2 (two) times daily.  . benzonatate (TESSALON) 200 MG capsule Take 1 capsule (200 mg total) by  mouth 2 (two) times daily as needed for cough.  Marland Kitchen. guaiFENesin-codeine (ROBITUSSIN AC) 100-10 MG/5ML syrup Take 5 mLs by mouth 2 (two) times daily as needed for cough.     Orders Placed This Encounter  Procedures  . CBC with Differential  . Lipid panel  . Comprehensive metabolic panel    No Follow-up on file.

## 2014-01-12 NOTE — Progress Notes (Signed)
Pre-visit discussion using our clinic review tool. No additional management support is needed unless otherwise documented below in the visit note.  

## 2014-01-13 ENCOUNTER — Encounter: Payer: Self-pay | Admitting: Internal Medicine

## 2014-01-13 NOTE — Assessment & Plan Note (Signed)
Acute infection resolved.

## 2014-01-13 NOTE — Assessment & Plan Note (Signed)
Recommended continued use if Protonix once daily and avoid NSAIDs and aspirin

## 2014-01-13 NOTE — Assessment & Plan Note (Signed)
Wilson's Disease [facility].

## 2014-01-15 ENCOUNTER — Encounter: Payer: Self-pay | Admitting: Internal Medicine

## 2014-01-15 NOTE — Addendum Note (Signed)
Addended by: Sherlene ShamsULLO, Nico Rogness L on: 01/15/2014 08:10 PM   Modules accepted: Orders

## 2014-01-16 ENCOUNTER — Encounter: Payer: Self-pay | Admitting: *Deleted

## 2014-02-13 ENCOUNTER — Other Ambulatory Visit (INDEPENDENT_AMBULATORY_CARE_PROVIDER_SITE_OTHER): Payer: 59

## 2014-02-13 DIAGNOSIS — D72819 Decreased white blood cell count, unspecified: Secondary | ICD-10-CM

## 2014-02-13 LAB — CBC WITH DIFFERENTIAL/PLATELET
Basophils Absolute: 0 10*3/uL (ref 0.0–0.1)
Basophils Relative: 0.6 % (ref 0.0–3.0)
Eosinophils Absolute: 0.1 10*3/uL (ref 0.0–0.7)
Eosinophils Relative: 2.7 % (ref 0.0–5.0)
HEMATOCRIT: 40.3 % (ref 39.0–52.0)
Hemoglobin: 13.6 g/dL (ref 13.0–17.0)
LYMPHS ABS: 0.9 10*3/uL (ref 0.7–4.0)
Lymphocytes Relative: 35.1 % (ref 12.0–46.0)
MCHC: 33.7 g/dL (ref 30.0–36.0)
MCV: 90.5 fl (ref 78.0–100.0)
MONOS PCT: 8.5 % (ref 3.0–12.0)
Monocytes Absolute: 0.2 10*3/uL (ref 0.1–1.0)
Neutro Abs: 1.3 10*3/uL — ABNORMAL LOW (ref 1.4–7.7)
Neutrophils Relative %: 53.1 % (ref 43.0–77.0)
Platelets: 147 10*3/uL — ABNORMAL LOW (ref 150.0–400.0)
RBC: 4.46 Mil/uL (ref 4.22–5.81)
RDW: 12.7 % (ref 11.5–14.6)
WBC: 2.5 10*3/uL — ABNORMAL LOW (ref 4.5–10.5)

## 2014-02-14 ENCOUNTER — Encounter: Payer: Self-pay | Admitting: Internal Medicine

## 2014-02-14 ENCOUNTER — Other Ambulatory Visit: Payer: Self-pay | Admitting: Internal Medicine

## 2014-02-14 ENCOUNTER — Telehealth: Payer: Self-pay | Admitting: *Deleted

## 2014-02-14 DIAGNOSIS — D696 Thrombocytopenia, unspecified: Secondary | ICD-10-CM

## 2014-02-14 NOTE — Telephone Encounter (Signed)
[facility] inform [facility] appointment was rescheduled to 02/28/14 at 11 am have left detailed message for patient on his mobile voicemail. FYI

## 2014-02-24 ENCOUNTER — Ambulatory Visit (INDEPENDENT_AMBULATORY_CARE_PROVIDER_SITE_OTHER): Payer: 59 | Admitting: Internal Medicine

## 2014-02-24 ENCOUNTER — Telehealth: Payer: Self-pay | Admitting: *Deleted

## 2014-02-24 ENCOUNTER — Ambulatory Visit: Payer: Self-pay | Admitting: Internal Medicine

## 2014-02-24 ENCOUNTER — Encounter: Payer: Self-pay | Admitting: Internal Medicine

## 2014-02-24 VITALS — BP 120/80 | HR 96 | Temp 97.8°F | Resp 16 | Wt 150.5 lb

## 2014-02-24 DIAGNOSIS — B029 Zoster without complications: Secondary | ICD-10-CM | POA: Insufficient documentation

## 2014-02-24 DIAGNOSIS — M79609 Pain in unspecified limb: Secondary | ICD-10-CM

## 2014-02-24 DIAGNOSIS — M79652 Pain in left thigh: Secondary | ICD-10-CM

## 2014-02-24 LAB — PROTIME-INR
INR: 1.3 ratio — ABNORMAL HIGH (ref 0.8–1.0)
Prothrombin Time: 13.3 s — ABNORMAL HIGH (ref 10.2–12.4)

## 2014-02-24 LAB — CBC WITH DIFFERENTIAL/PLATELET
Basophils Absolute: 0 10*3/uL (ref 0.0–0.1)
Basophils Relative: 0.6 % (ref 0.0–3.0)
EOS ABS: 0.1 10*3/uL (ref 0.0–0.7)
EOS PCT: 2.4 % (ref 0.0–5.0)
HEMATOCRIT: 42.4 % (ref 39.0–52.0)
Hemoglobin: 14.2 g/dL (ref 13.0–17.0)
Lymphocytes Relative: 27.4 % (ref 12.0–46.0)
Lymphs Abs: 0.8 10*3/uL (ref 0.7–4.0)
MCHC: 33.5 g/dL (ref 30.0–36.0)
MCV: 91.5 fl (ref 78.0–100.0)
MONO ABS: 0.3 10*3/uL (ref 0.1–1.0)
Monocytes Relative: 9.2 % (ref 3.0–12.0)
Neutro Abs: 1.8 10*3/uL (ref 1.4–7.7)
Neutrophils Relative %: 60.4 % (ref 43.0–77.0)
PLATELETS: 155 10*3/uL (ref 150.0–400.0)
RBC: 4.64 Mil/uL (ref 4.22–5.81)
RDW: 13 % (ref 11.5–14.6)
WBC: 3 10*3/uL — ABNORMAL LOW (ref 4.5–10.5)

## 2014-02-24 LAB — D-DIMER, QUANTITATIVE (NOT AT ARMC): D-Dimer, Quant: 0.27 ug/mL-FEU (ref 0.00–0.48)

## 2014-02-24 NOTE — Telephone Encounter (Signed)
Could not reach patient reached recording no voicemail.

## 2014-02-24 NOTE — Progress Notes (Signed)
Patient ID: Christian Montgomery, male   DOB: 10-13-1991, 23 y.o.   MRN: [id number]  Patient Active Problem List   Diagnosis Date Noted  . Acute pain of left thigh 02/24/2014  . Thrombocytopenia 02/14/2014  . Bordetella pertussis 12/13/2013  . Leukopenia 12/13/2013  . Dysautonomia/POTS 09/06/2013  . Allergic rhinitis 03/25/2013  . Constipation - functional 10/14/2012  . Gilbert's disease 09/22/2012  . Personal history of gastric ulcer 03/01/2012    Subjective:  CC:   Chief Complaint  Patient presents with  . Acute Visit    left leg burning sensation and tender to touch, at posterior knee.    HPI:   Christian Montgomery is a 23 y.o. male who presents for 4 day history of burning sensation and pain involving his left popliteal area and inner thigh. Accompanied by tightness.  Recent prolonged travel is a isk factor for DVT as he drove 3.5 hours to the beach and back on Saturday .  5 days prior.  [facility] og recurrent DVT in grandmother.     Past Medical History  Diagnosis Date  . Gastric ulcer with obstruction April 2013    hosp [facility] , [facility]    Past Surgical History  Procedure Laterality Date  . Cosmetic surgery         The following portions of the patient's history were reviewed and updated as appropriate: Allergies, current medications, and problem list.    Review of Systems:   Patient denies headache, fevers, malaise, unintentional weight loss, skin rash, eye pain, sinus congestion and sinus pain, sore throat, dysphagia,  hemoptysis , cough, dyspnea, wheezing, chest pain, palpitations, orthopnea, edema, abdominal pain, nausea, melena, diarrhea, constipation, flank pain, dysuria, hematuria, urinary  Frequency, nocturia, numbness, tingling, seizures,  Focal weakness, Loss of consciousness,  Tremor, insomnia, depression, anxiety, and suicidal ideation.     History   Social History  . Marital Status: Single    Spouse Name: N/A    Number of Children: N/A  . Years of Education: N/A    Occupational History  . Not on file.   Social History Main Topics  . Smoking status: Never Smoker   . Smokeless tobacco: Never Used  . Alcohol Use: No  . Drug Use: No  . Sexual Activity: No   Other Topics Concern  . Not on file   Social History Narrative  . No narrative on file    Objective:  Filed Vitals:   02/24/14 0910  BP: 120/80  Pulse: 96  Temp: 97.8 F (36.6 C)  Resp: 16     General appearance: alert, cooperative and appears stated age Ears: normal TM's and external ear canals both ears Throat: lips, mucosa, and tongue normal; teeth and gums normal Neck: no adenopathy, no carotid bruit, supple, symmetrical, trachea midline and thyroid not enlarged, symmetric, no tenderness/mass/nodules Back: symmetric, no curvature. ROM normal. No CVA tenderness. Lungs: clear to auscultation bilaterally Heart: regular rate and rhythm, S1, S2 normal, no murmur, click, rub or gallop Abdomen: soft, non-tender; bowel sounds normal; no masses,  no organomegaly Pulses: 2+ and symmetric Skin: Skin color, texture, turgor normal. No rashes or lesions Lymph nodes: Cervical, supraclavicular, and axillary nodes normal. Ext; left thigh tight with no bruising erythema or edema  Assessment and Plan:  Acute pain of left thigh Occurring in the setting of a recent 6 hr round trip to the beach  and FH of DVT .  Doppler ultrasound was negativef for DVT,  Did see some enlarged LNs in the  groin.  There were no signs of cellulitis on exam.  NSAIDs,  Ice for what may be a pulled groin  muscle from walking on the beach .    Updated Medication List Outpatient Encounter Prescriptions as of 02/24/2014  Medication Sig  . pantoprazole (PROTONIX) 40 MG tablet Take 1 tablet (40 mg total) by mouth daily. In the mornign before breakfast  . benzonatate (TESSALON) 200 MG capsule Take 1 capsule (200 mg total) by mouth 2 (two) times daily as needed for cough.  Marland Kitchen. guaiFENesin-codeine (ROBITUSSIN AC) 100-10  MG/5ML syrup Take 5 mLs by mouth 2 (two) times daily as needed for cough.

## 2014-02-24 NOTE — Telephone Encounter (Signed)
He does not have a DVT,  He has a few enlarged lymph nodes which may indicate an infection  Somewhere,  Can take ibuprofen 600 mg and tyelnol 500 mg three times daily or try cold packs on swollen area.  Follow up 1 week  If no better.

## 2014-02-24 NOTE — Telephone Encounter (Signed)
Called report for ultra sound Negative for DVT, But two lymph nodes notable in groin largest measuring 2.2 X 1.4 X 0.5 cm. FYI please advise.

## 2014-02-24 NOTE — Progress Notes (Signed)
Pre-visit discussion using our clinic review tool. No additional management support is needed unless otherwise documented below in the visit note.  

## 2014-02-24 NOTE — Assessment & Plan Note (Addendum)
Occurring in the setting of a recent 6 hr round trip to the beach  and FH of DVT .  Doppler ultrasound was negativef for DVT,  Did see some enlarged LNs in the groin.  There were no signs of cellulitis on exam.  NSAIDs,  Ice for what may be a pulled groin  muscle from walking on the beach .

## 2014-02-24 NOTE — Patient Instructions (Signed)
Your leg pain may be due to a blood clot from your recent long trip to the beach  I am sending you for an ultrasound of your leg to rule this out  If it is negative,  You probably pulled a muscle from walking on the beach in loose sand, and you can treat with heating pad or ice, ,  advil or aleve and trylneol as directed on the bottle.

## 2014-02-27 NOTE — Telephone Encounter (Signed)
Patient has C/O tenderness still in groin area and of burning sensation radiating to umbilical area scheduled patient for 03/02/14 . Please advise if OK?

## 2014-02-27 NOTE — Telephone Encounter (Signed)
Yes 4/2 is ok,

## 2014-02-28 ENCOUNTER — Ambulatory Visit: Payer: Self-pay | Admitting: Oncology

## 2014-02-28 LAB — CBC CANCER CENTER
Basophil #: 0 x10 3/mm (ref 0.0–0.1)
Basophil %: 0.9 %
EOS ABS: 0.1 x10 3/mm (ref 0.0–0.7)
Eosinophil %: 3 %
HCT: 44.3 % (ref 40.0–52.0)
HGB: 14.4 g/dL (ref 13.0–18.0)
LYMPHS ABS: 0.8 x10 3/mm — AB (ref 1.0–3.6)
LYMPHS PCT: 22 %
MCH: 29.6 pg (ref 26.0–34.0)
MCHC: 32.5 g/dL (ref 32.0–36.0)
MCV: 91 fL (ref 80–100)
MONOS PCT: 11.6 %
Monocyte #: 0.4 x10 3/mm (ref 0.2–1.0)
Neutrophil #: 2.2 x10 3/mm (ref 1.4–6.5)
Neutrophil %: 62.5 %
PLATELETS: 143 x10 3/mm — AB (ref 150–440)
RBC: 4.86 10*6/uL (ref 4.40–5.90)
RDW: 13 % (ref 11.5–14.5)
WBC: 3.5 x10 3/mm — AB (ref 3.8–10.6)

## 2014-02-28 LAB — IRON AND TIBC
Iron Bind.Cap.(Total): 342 ug/dL (ref 250–450)
Iron Saturation: 20 %
Iron: 67 ug/dL (ref 65–175)
UNBOUND IRON-BIND. CAP.: 275 ug/dL

## 2014-02-28 LAB — FOLATE: Folic Acid: 9.6 ng/mL (ref 3.1–100.0)

## 2014-02-28 LAB — FERRITIN: FERRITIN (ARMC): 35 ng/mL (ref 8–388)

## 2014-02-28 LAB — LACTATE DEHYDROGENASE: LDH: 145 U/L (ref 85–241)

## 2014-03-01 ENCOUNTER — Ambulatory Visit: Payer: Self-pay | Admitting: Oncology

## 2014-03-02 ENCOUNTER — Encounter: Payer: Self-pay | Admitting: Internal Medicine

## 2014-03-02 ENCOUNTER — Ambulatory Visit (INDEPENDENT_AMBULATORY_CARE_PROVIDER_SITE_OTHER): Payer: 59 | Admitting: Internal Medicine

## 2014-03-02 VITALS — BP 128/78 | HR 100 | Temp 97.9°F | Resp 16 | Wt 147.0 lb

## 2014-03-02 DIAGNOSIS — B029 Zoster without complications: Secondary | ICD-10-CM

## 2014-03-02 MED ORDER — PANTOPRAZOLE SODIUM 40 MG PO TBEC: 40.0000 mg | DELAYED_RELEASE_TABLET | Freq: Every day | ORAL | Status: DC

## 2014-03-02 MED ORDER — HYDROCODONE-ACETAMINOPHEN 5-325 MG PO TABS: 1.0000 | ORAL_TABLET | ORAL | Status: DC | PRN

## 2014-03-02 NOTE — Patient Instructions (Addendum)
I am prescribing a pain medication to take along with your valacyclovir that Dr Orlie DakinFinnegan gave you.    You can take it every 4 hours if needed for  Pain.  It also has tylenol in it. So you do not need to combine tylenol with it.     If it makes you constipated  Use the dulcolax at bedtime    Shingles Shingles is caused by the same virus that causes chickenpox. The first feelings may be pain or tingling. A rash will follow in a couple days. The rash may occur on any area of the body. Long-lasting pain is more likely in an elderly person. It can last months to years. There are medicines that can help prevent pain if you start taking them early. HOME CARE   Place cool cloths on the rash.  Only take medicine as told by your doctor.  You may use calamine lotion to relieve itchy skin.  Avoid touching:  Babies.  Children with inflamed skin (eczema).  People who have gotten transplanted organs.  People with chronic illnesses, such as leukemia and AIDS.  If the rash is on the face, you may need to see a specialist. Keep all appointments. Shingles must be kept away from the eyes, if possible.  Keep all appointments.  Avoid touching the eyes or eye area, if possible. GET HELP RIGHT AWAY IF:   You have any pain on the face or eye.  Your medicines do not help.  Your redness or puffiness (swelling) spreads.  You have a fever.  You notice any red lines going away from the rash area. MAKE SURE YOU:   Understand these instructions.  Will watch your condition.  Will get help right away if you are not doing well or get worse. Document Released: 05/05/2008 Document Revised: 02/09/2012 Document Reviewed: 05/05/2008 [facility] Patient Information 2014 [place], [place].

## 2014-03-02 NOTE — Progress Notes (Signed)
Pre-visit discussion using our clinic review tool. No additional management support is needed unless otherwise documented below in the visit note.  

## 2014-03-04 NOTE — Progress Notes (Signed)
Patient ID: Christian Montgomery, male   DOB: [birth year], 23 y.o.   MRN: [id number]  Patient Active Problem List   Diagnosis Date Noted  . Shingles outbreak 02/24/2014  . Thrombocytopenia 02/14/2014  . Bordetella pertussis 12/13/2013  . Leukopenia 12/13/2013  . Dysautonomia/POTS 09/06/2013  . Allergic rhinitis 03/25/2013  . Constipation - functional 10/14/2012  . Gilbert's disease 09/22/2012  . Personal history of gastric ulcer 03/01/2012    Subjective:  CC:   Chief Complaint  Patient presents with  . Acute Visit    Burning sensation goes up to umbilical area and rash on left leg and mid lower back.    HPI:   Christian Montgomery is a 23 y.o. male who presents for Blistering painful rash.  Started with pain in inner thigh several days ago worrisome for DVT given [place] trip to beach several days prior to onset of symptoms.  U/s was normal, but during recent hematology evaluation for leukopenia and thrombocytopenia  His rash had developed and Dr Orlie Dakin prescribed valtrex.  Patient started the valtrex.  The [employer] pharmacist apparently violated HIPAA by notifying [employer] so patient was sent home from work and told not to return without a doctor's note.  His rash is quite severe an erythematous and starts on the left side of his back as well.    Past Medical History  Diagnosis Date  . Gastric ulcer with obstruction April 2013    hosp [facility]    Past Surgical History  Procedure Laterality Date  . Cosmetic surgery         The following portions of the patient's history were reviewed and updated as appropriate: Allergies, current medications, and problem list.    Review of Systems:   Patient denies headache, fevers, malaise, unintentional weight loss, skin rash, eye pain, sinus congestion and sinus pain, sore throat, dysphagia,  hemoptysis , cough, dyspnea, wheezing, chest pain, palpitations, orthopnea, edema, abdominal pain, nausea, melena, diarrhea, constipation, flank pain,  dysuria, hematuria, urinary  Frequency, nocturia, numbness, tingling, seizures,  Focal weakness, Loss of consciousness,  Tremor, insomnia, depression, anxiety, and suicidal ideation.     History   Social History  . Marital Status: Single    Spouse Name: N/A    Number of Children: N/A  . Years of Education: N/A   Occupational History  . Not on file.   Social History Main Topics  . Smoking status: Never Smoker   . Smokeless tobacco: Never Used  . Alcohol Use: No  . Drug Use: No  . Sexual Activity: No   Other Topics Concern  . Not on file   Social History Narrative  . No narrative on file    Objective:  Filed Vitals:   03/02/14 1456  BP: 128/78  Pulse: 100  Temp: 97.9 F (36.6 C)  Resp: 16     General appearance: alert, cooperative and appears stated age Ears: normal TM's and external ear canals both ears Throat: lips, mucosa, and tongue normal; teeth and gums normal Neck: no adenopathy, no carotid bruit, supple, symmetrical, trachea midline and thyroid not enlarged, symmetric, no tenderness/mass/nodules Back: symmetric, no curvature. ROM normal. No CVA tenderness. Lungs: clear to auscultation bilaterally Heart: regular rate and rhythm, S1, S2 normal, no murmur, click, rub or gallop Abdomen: soft, non-tender; bowel sounds normal; no masses,  no organomegaly Pulses: 2+ and symmetric Skin: erythematous blistering rash on left side of back and left thigh Lymph nodes: Cervical, supraclavicular, and axillary nodes normal.  Assessment and  Plan:  Shingles outbreak Continue valtrex,  Adding vicodin for pain control   Updated Medication List Outpatient Encounter Prescriptions as of 03/02/2014  Medication Sig  . pantoprazole (PROTONIX) 40 MG tablet Take 1 tablet (40 mg total) by mouth daily. In the mornign before breakfast  . valACYclovir (VALTREX) 1000 MG tablet Take 1,000 mg by mouth 2 (two) times daily.  . [DISCONTINUED] pantoprazole (PROTONIX) 40 MG tablet Take 1  tablet (40 mg total) by mouth daily. In the mornign before breakfast  . benzonatate (TESSALON) 200 MG capsule Take 1 capsule (200 mg total) by mouth 2 (two) times daily as needed for cough.  Marland Kitchen. guaiFENesin-codeine (ROBITUSSIN AC) 100-10 MG/5ML syrup Take 5 mLs by mouth 2 (two) times daily as needed for cough.  Marland Kitchen. HYDROcodone-acetaminophen (NORCO/VICODIN) 5-325 MG per tablet Take 1 tablet by mouth every 4 (four) hours as needed for moderate pain.     No orders of the defined types were placed in this encounter.    No Follow-up on file.

## 2014-03-04 NOTE — Assessment & Plan Note (Signed)
Continue valtrex,  Adding vicodin for pain control

## 2014-03-13 ENCOUNTER — Encounter: Payer: Self-pay | Admitting: Internal Medicine

## 2014-03-27 ENCOUNTER — Encounter: Payer: Self-pay | Admitting: Internal Medicine

## 2014-03-28 MED ORDER — VALACYCLOVIR HCL 1 G PO TABS: 1000.0000 mg | ORAL_TABLET | Freq: Two times a day (BID) | ORAL | Status: DC

## 2014-05-23 ENCOUNTER — Encounter: Payer: Self-pay | Admitting: Internal Medicine

## 2014-05-23 ENCOUNTER — Ambulatory Visit (INDEPENDENT_AMBULATORY_CARE_PROVIDER_SITE_OTHER): Payer: 59 | Admitting: Internal Medicine

## 2014-05-23 VITALS — BP 100/70 | HR 60 | Ht 74.0 in | Wt 145.2 lb

## 2014-05-23 DIAGNOSIS — R42 Dizziness and giddiness: Secondary | ICD-10-CM

## 2014-05-23 DIAGNOSIS — G909 Disorder of the autonomic nervous system, unspecified: Secondary | ICD-10-CM

## 2014-05-23 DIAGNOSIS — G901 Familial dysautonomia [Riley-Day]: Secondary | ICD-10-CM

## 2014-05-23 NOTE — Progress Notes (Signed)
      Patient Care Team: Sherlene Shamseresa L Tullo, MD as PCP - General (Internal Medicine)   HPI  Lenox PondsJerry L Montgomery is a 23 y.o. male Seen in followup for dysautonomic symptoms   Having reviewed the physiology previously he was much better at the last visit, notably in the winter  This summer he has struggled more with lightheadedness. His urine color is yellow. He adds salt to his food but his mother restricted some. I should note that he works outside.   Past Medical History  Diagnosis Date  . Gastric ulcer with obstruction April 2013    [facility]    Past Surgical History  Procedure Laterality Date  . Cosmetic surgery      Current Outpatient Prescriptions  Medication Sig Dispense Refill  . pantoprazole (PROTONIX) 40 MG tablet Take 1 tablet (40 mg total) by mouth daily. In the mornign before breakfast  90 tablet  2   No current facility-administered medications for this visit.    No Known Allergies  Review of Systems negative except from HPI and PMH  Physical Exam BP 100/70  Pulse 60  Ht 6\' 2"  (1.88 m)  Wt 145 lb 4 oz (65.885 kg)  BMI 18.64 kg/m2 Well developed and nourished in no acute distress HENT normal Neck supple with JVP-flat Clear Regular rate and rhythm, no murmurs or gallops Abd-soft with active BS No Clubbing cyanosis edema Skin-warm and dry A & Oriented  Grossly normal sensory and motor function    Assessment and  Plan  Dysautonomia  Begin reviewed the importance of volume and salt repletion. He will work on trying to increase his volume intake to make his urine clear. If he persists with symptoms, we will begin taking Thermo tabs 2 twice daily. We will see him in 4 months.

## 2014-06-15 ENCOUNTER — Ambulatory Visit: Payer: Self-pay | Admitting: Oncology

## 2014-06-15 LAB — CBC CANCER CENTER
BASOS ABS: 0 x10 3/mm (ref 0.0–0.1)
BASOS PCT: 0.7 %
EOS ABS: 0.1 x10 3/mm (ref 0.0–0.7)
Eosinophil %: 2.4 %
HCT: 40.9 % (ref 40.0–52.0)
HGB: 13.6 g/dL (ref 13.0–18.0)
LYMPHS ABS: 1.2 x10 3/mm (ref 1.0–3.6)
LYMPHS PCT: 26.9 %
MCH: 30.3 pg (ref 26.0–34.0)
MCHC: 33.2 g/dL (ref 32.0–36.0)
MCV: 91 fL (ref 80–100)
MONOS PCT: 6.4 %
Monocyte #: 0.3 x10 3/mm (ref 0.2–1.0)
Neutrophil #: 2.8 x10 3/mm (ref 1.4–6.5)
Neutrophil %: 63.6 %
PLATELETS: 156 x10 3/mm (ref 150–440)
RBC: 4.48 10*6/uL (ref 4.40–5.90)
RDW: 12.5 % (ref 11.5–14.5)
WBC: 4.4 x10 3/mm (ref 3.8–10.6)

## 2014-07-01 ENCOUNTER — Ambulatory Visit: Payer: Self-pay | Admitting: Oncology

## 2014-09-26 ENCOUNTER — Ambulatory Visit: Payer: 59 | Admitting: Internal Medicine

## 2014-09-26 ENCOUNTER — Encounter: Payer: Self-pay | Admitting: *Deleted

## 2014-12-26 ENCOUNTER — Encounter: Payer: Self-pay | Admitting: Internal Medicine

## 2014-12-26 ENCOUNTER — Ambulatory Visit (INDEPENDENT_AMBULATORY_CARE_PROVIDER_SITE_OTHER): Payer: Commercial Managed Care - PPO | Admitting: Internal Medicine

## 2014-12-26 VITALS — BP 102/70 | HR 65 | Ht 74.0 in | Wt 158.0 lb

## 2014-12-26 DIAGNOSIS — G901 Familial dysautonomia [Riley-Day]: Secondary | ICD-10-CM

## 2014-12-26 DIAGNOSIS — G909 Disorder of the autonomic nervous system, unspecified: Secondary | ICD-10-CM

## 2014-12-26 MED ORDER — PANTOPRAZOLE SODIUM 40 MG PO TBEC: 40.0000 mg | DELAYED_RELEASE_TABLET | Freq: Every day | ORAL | Status: DC

## 2014-12-26 NOTE — Patient Instructions (Signed)
Your physician recommends that you schedule a follow-up appointment in:  PRN   Your physician recommends that you continue on your current medications as directed. Please refer to the Current Medication list given to you today.

## 2014-12-26 NOTE — Progress Notes (Signed)
Electrophysiology Office Note   Date:  12/26/2014   ID:  Christian Montgomery, DOB [birth year], MRN [id number]  PCP:  Sherlene Shams, MD  Cardiologist:  none Primary Electrophysiologist:  Sherryl Manges, MD    No chief complaint on file.    History of Present Illness: Christian Montgomery is a 24 y.o. male seen in followup electrophysiology evaluation for  POTS and dysautonomia   Today, he denies symptoms of palpitations, chest pain, shortness of breath,   dizziness, presyncope, syncope,   . The patient is tolerating salt supplementation without difficulties and is otherwise without complaint today.    Past Medical History  Diagnosis Date  . Gastric ulcer with obstruction April 2013    [facility]   Past Surgical History  Procedure Laterality Date  . Cosmetic surgery       Current Outpatient Prescriptions  Medication Sig Dispense Refill  . oral electrolytes (THERMOTABS) TABS tablet Take 2 tablets by mouth daily.    . pantoprazole (PROTONIX) 40 MG tablet Take 1 tablet (40 mg total) by mouth daily. In the morning before breakfast 90 tablet 1   No current facility-administered medications for this visit.    Allergies:   Review of patient's allergies indicates no known allergies.   Social History:  The patient  reports that he has never smoked. He has never used smokeless tobacco. He reports that he does not drink alcohol or use illicit drugs.   Family History:  The patient's family history includes Alcohol abuse in his father; COPD in his father; Cancer in his maternal grandmother; Diabetes in his maternal grandmother; Diabetes (age of onset: 53) in his sister; Early death in his maternal grandmother; Heart disease in his father; Heart failure in his paternal grandmother.    ROS:  Please see the history of present illness.   Otherwise, review of systems is negative t   PHYSICAL EXAM: VS:  BP 102/70 mmHg  Pulse 65  Ht  (1.88 m)  Wt 158 lb (71.668 kg)  BMI 20.28  kg/m2 , BMI Body mass index is 20.28 kg/(m^2). GEN: Well nourished, well developed, in no acute distress HEENT: normal Neck: JVD flat  Cardiac: R RR;  no murmur  Rubs  noS4  Respiratory:  clear to auscultation bilaterally, normal work of breathing Back without kyphosis or CVAT GI: soft, nontender, nondistended, + BS MS: no deformity or atrophy Skin: warm and dry,  Extremities No Clubbing cyanosis  Edema Neuro:  Strength and sensation are intact Psych: euthymic mood, full affect  EKG:  EKG is ordered today. The ekg ordered today shows sinus rhythm at 65 Intervals 13/10/40 Axis 64   Recent Labs: 01/12/2014: ALT 16; BUN 13; Creatinine 0.8; Potassium 4.2; Sodium 138 02/24/2014: Hemoglobin 14.2; Platelets 155.0    Lipid Panel     Component Value Date/Time   CHOL 118 01/12/2014 1104   TRIG 29.0 01/12/2014 1104   HDL 46.40 01/12/2014 1104   CHOLHDL 3 01/12/2014 1104   VLDL 5.8 01/12/2014 1104   LDLCALC 66 01/12/2014 1104     Wt Readings from Last 3 Encounters:  12/26/14 158 lb (71.668 kg)  05/23/14 145 lb 4 oz (65.885 kg)  03/02/14 147 lb (66.679 kg)      Other studies Reviewed:  Review of the above records today demonstrates:    ASSESSMENT AND PLAN: Dysautonomia/POTS  Stable  Continue current regime  We discussed followup and he will see [place] as needed    Current  medicines are reviewed at length with the patient today.   The patient does not have concerns regarding his medicines.  The following changes were made today:  none  Labs/ tests ordered today include:  Orders Placed This Encounter  Procedures  . EKG 12-Lead     Disposition:   FU with prn   Signed, Sherryl MangesSteven Javaughn Opdahl, MD  12/26/2014 9:29 AM     [facility] [address] [place] [address] [phone] (office) [phone] (fax)

## 2015-03-25 NOTE — Consult Note (Signed)
Pt doing well on full liquid diet.  No pain or vomiting. He can go home tomorrow on very soft diet and PPI bid.  Avoid all NSAID meds.  See me in office in 2-3 weeks and I will do EGD in 8-10 weeks.  Electronic Signatures: Scot JunElliott, Vallarie Fei T (MD)  (Signed on 23-Apr-13 17:12)  Authored  Last Updated: 23-Apr-13 17:12 by Scot JunElliott, Maysin Carstens T (MD)

## 2015-03-25 NOTE — H&P (Signed)
PATIENT NAME:  Lenox Montgomery, Christian L MR#:  [id number] DATE OF BIRTH:  [birth year]  DATE OF ADMISSION:  03/20/2012  CHIEF COMPLAINT: Chest pain.   HISTORY OF PRESENT ILLNESS: Mr. Christian Montgomery is a 24 year old white male with no significant past medical history who presents with a four-week history of chest pain accompanied by diarrhea and nausea. The patient states that he was seen in the Emergency Room on 03/31 for chest pain and fever, and because he was reporting a sore throat was evaluated and treated for strep throat and sent home. He finished his antibiotics, did not feel better, and saw Dr. Lacie ScottsNiemeyer as a new patient for continued chest pain and was diagnosed by Dr. Lacie ScottsNiemeyer with an ultrasound of the heart with a "hole in the heart" and arrhythmia. He was given initially meloxicam, which he has been taking on a daily basis, followed by addition of gabapentin and tramadol yesterday. He was also told by Dr. Lacie ScottsNiemeyer that he probably had a disk in his neck that was herniated which was causing his pain. In the Emergency Room, the patient was sent for a CT of the abdomen and pelvis and found to have grossly distended stomach measuring 20.8 x 11.3 cm with a decompressed distal bowel. He is being admitted for gastric decompression and GI evaluation.   PRIMARY CARE PHYSICIAN: He has none.  He has seen Dr. Lacie ScottsNiemeyer recently but is looking for another doctor.   PAST MEDICAL HISTORY:  None.  MEDICATIONS: He has been taking meloxicam 15 mg daily for the last four weeks. He did start taking gabapentin yesterday but it made him feel weird. He did not start the tramadol.  PAST SURGICAL HISTORY:  None.   ALLERGIES: None.   HOSPITALIZATIONS: No prior hospitalizations.   FAMILY HISTORY: Maternal grandmother has diabetes and uterine cancer. Father died of complications secondary to alcoholism and tobacco abuse. He apparently did have a pancreatectomy at some point, which family is not sure why but does not think he had a  history of cancer.   SOCIAL HISTORY: The patient is a nonsmoker and a nondrinker. 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   REVIEW OF SYSTEMS: He believes he has had some weight loss, but he does not weigh himself. He has had decreased appetite, some nausea without vomiting, and some diarrhea lately. He does not endorse any changes in vision or cataracts. He has no history of seasonal allergies, snoring, postnasal drip or sinus pain. He has noted some increased shortness of breath with exertion for the last several weeks. He denies cough  and any history of asthma. He has chest pain per history of present illness without orthopnea, edema, or arrhythmia. He has no history of dysuria, hematuria, renal calculi, or incontinence. He has no history of prostatitis. No history of polyuria, nocturia, or thyroid problems. He has not noticed any easy bruising or bleeding. He has no history of anemia. No changes in skin. He does note chest pain but no neck, back, shoulder, knee or hip pain. No numbness, weakness, dysarthria, or vertigo. No history of headaches, migraines, or seizures. No history of anxiety or insomnia.   PHYSICAL EXAMINATION:  GENERAL: This is a thin young male who appears moderately uncomfortable.   VITAL SIGNS: Temperature is 98.1, pulse 80, blood pressure is 138/83, respirations 14 per minute and he is sating 100% on room air.   HEENT: Pupils are dilated currently but reactive. Extraocular movements are intact. Sclerae are anicteric.  Oropharynx  benign.    NECK: Supple without lymphadenopathy, thyromegaly, or carotid bruits.   LUNGS: Clear to auscultation bilaterally with decreased breath sounds bilaterally.   CARDIOVASCULAR: Regular rate and rhythm. No murmurs, rubs, or gallops. No pedal edema, and pulses are palpable.   ABDOMEN: Soft, distended. Hyperactive bowel sounds are noted. He is tender to palpation.   MUSCULOSKELETAL: Grossly nonfocal. He has no skin  rashes or lesions. He has no cervical, axillary, inguinal, or supraclavicular lymphadenopathy.   NEUROLOGICAL: Grossly nonfocal. He is alert and oriented to person, place, and time.   ADMISSION LABORATORY DATA: Sodium 142, potassium 4.1, chloride 103, bicarbonate 30, BUN 11, creatinine 0.85, glucose 115, white count 4.7, hemoglobin 14.1, platelets 182, lipase 100. Liver function tests are normal. Urinalysis is negative. Urine drug screen is negative. Normal sinus rhythm on his EKG. Chest x-ray is within normal limits. CT of the abdomen and pelvis: Marked dilation of the stomach 20.86 x 11.36 cm. Distal bowel is decompressed. Please note that when the patient was seen in Emergency Room on 03/31 he did have a temperature of 101.7. At that time he had a positive throat culture for group A strep.   ASSESSMENT AND PLAN:  1. Chest pain: Likely secondary to significant gastric distention as he has no risk factors for other causes of cardiac chest pain. We will treat with IV Protonix, gastric decompression, and  consider repeating his echocardiogram since his family is concerned now that he has a hole in his heart that is going undiagnosed. 2. Gastric outlet obstruction: Suggested by gastric distention. Since the symptoms predated the use of meloxicam, I am concerned that there is some something else as a cause for his pain. He will need gastric decompression and GI evaluation in house.  3. Chronic diarrhea: We will send stool for ova and parasites as this could be a presentation of parasite infestation as well causing gastric outlet obstruction.   ESTIMATED TIME OF CARE: 45 minutes.   ____________________________ Duncan Dull, MD tt:bjt D: 03/20/2012 19:43:28 ET T: 03/21/2012 09:29:01 ET JOB#: [id number]  cc: Duncan Dull, MD, <Dictator> Duncan Dull MD ELECTRONICALLY SIGNED 04/08/2012 18:47

## 2015-03-25 NOTE — Consult Note (Signed)
PATIENT NAME:  Christian Montgomery, Christian Montgomery MR#:  [id number] DATE OF BIRTH:  24-Aug-1991  DATE OF CONSULTATION:  03/21/2012  REFERRING PHYSICIAN:   CONSULTING PHYSICIAN:  Scot Junobert T. Elliott, MD  HISTORY OF PRESENT ILLNESS: The patient is a 24 year old white male who came to the ER because of abdominal pain and discomfort and four weeks of chest pain where he was noted to have a very distended stomach on CAT scan. An NG tube was placed and his stomach was decompressed and he was admitted for evaluation. I was asked to see him in consultation.   The patient saw Dr. Lacie ScottsNiemeyer a few weeks ago complaining of chest pain, diarrhea, and nausea. He had an echocardiogram done that was negative. They thought they heard a murmur. He was treated with meloxicam which he had been taking on a daily basis. Because of continued symptoms, he was given gabapentin and tramadol the day before admission.   The patient was seen in the ER on 03/31 for chest pain and fever and was diagnosed with strep throat and treated with 10 days of penicillin. His throat problems resolved. He had no problem to his knowledge with the antibiotics.   PAST MEDICAL HISTORY: None.   PAST SURGICAL HISTORY: None.   ALLERGIES: No known drug allergies.   FAMILY HISTORY: His father died of complications of alcoholism, may have had chronic pancreatitis.   HABITS: Does not smoke, does not drink.  REVIEW OF SYSTEMS: He has lost 5 or 10 pounds, decreased appetite, some nausea that would be worse after eating but no vomiting. His "chest pain" is actually epigastric in nature at the tip of his xiphoid process. No dysuria or hematuria. No shortness of breath No known anemia. No history of known ulcer disease. Denies any nonsteroidal anti-inflammatory drug use except meloxicam.    PHYSICAL EXAMINATION:   GENERAL: Pleasant white male in no acute distress with NG tube in his nose.   HEENT: Sclerae nonicteric. Conjunctivae negative. Tongue negative.   HEAD:  Atraumatic.   NECK: Trachea is in the midline.   CHEST: Clear to auscultation and percussion.   ABDOMEN: Flat. Bowel sounds present. No hepatosplenomegaly. No masses. No bruits. Minimal tenderness in the epigastric area.   SKIN: Warm and dry.   PSYCH: Mood and affect are appropriate.   VITAL SIGNS: Temperature 98.1, pulse 56 to 67, blood pressure 103/69, pulse oximetry 98%.   LABORATORY DATA: Glucose 94, BUN 10, creatinine 0.67, sodium 141, potassium 3.9, chloride 108, CO2 28, calcium 8.3, magnesium 1.8, lipase 100, amylase 37, total protein 6, albumin 3.6, total bilirubin 1.9, admission was 1.5, direct bilirubin 0.2. He may have Gilbert syndrome. Alkaline phosphatase 50, SGOT 17, SGPT 27. CPK-MB and troponins negative. Urine drug screen negative. White count 4.7, hemoglobin 14, platelet count 182. Urinalysis unremarkable.   A CAT scan of the abdomen done yesterday before insertion of the NG tube showed marked dilatation of the stomach 20 x 11 cm. Contrast and debris appreciated in the dependent portion of the stomach. They recommended decompression with an NG tube. A repeat abdominal film today shows much improvement in the stomach.   ASSESSMENT: Given his epigastric pain, nausea, decreased appetite, and gastric outlet obstruction-type picture on CAT scan, he most likely has pyloric channel ulcers or duodenal ulcers.   PLAN: The plan is to do an upper endoscopy tomorrow after decompressing his stomach the rest of the day. If it looks like he may need surgery, will contact surgeon. Obviously would prefer to try to  treat this with medication if possible. He is on pantoprazole 40 mg q.12 hours. I will leave him at this at this time.   ____________________________ Scot Jun, MD rte:drc D: 03/21/2012 11:39:34 ET T: 03/21/2012 12:00:26 ET JOB#: [id number]  cc: Scot Jun, MD, <Dictator> Scot Jun MD ELECTRONICALLY SIGNED 04/07/2012 9:25

## 2015-03-25 NOTE — Consult Note (Signed)
Pt with gastric ulcer, no outlet obstr.  Treat with medications, clear liq diet with Boost or Ensure.  Await bx.  Leave out NG tube.  Electronic Signatures: Scot JunElliott, Danyka Merlin T (MD)  (Signed on 22-Apr-13 11:29)  Authored  Last Updated: 22-Apr-13 11:29 by Scot JunElliott, Zuzanna Maroney T (MD)

## 2015-03-25 NOTE — Discharge Summary (Signed)
PATIENT NAME:  Christian Montgomery, Christian Montgomery MR#:  [id number] DATE OF BIRTH:  05/25/1991  DATE OF ADMISSION:  03/20/2012 DATE OF DISCHARGE:  03/24/2012  PRIMARY CARE PHYSICIAN: Formerly Dr. Lacie Scotts, now Dr. Juanetta Gosling    REASON FOR ADMISSION:  1. Chest pain.  2. Melena.  DISCHARGE DIAGNOSES:  1. Chest pain and melena secondary to NSAID-induced gastric ulcer.  2. Gastric ulcer, NSAID-induced.  3. Significant gastric distention requiring NG tube decompression. Distention was secondary to ulcer.  4. Abnormal liver function tests/hyperbilirubinemia with possible suspected Gilbert's syndrome per Gastroenterology which is currently asymptomatic.   REASON FOR ADMISSION: Chest pain and melena,   CONSULTATION: GI with Dr. Mechele Collin    DISCHARGE DISPOSITION: Home.   DISCHARGE MEDICATIONS:  1. Protonix 40 mg p.o. b.i.d. until further advised by Dr. Mechele Collin. 2. Tylenol 325 mg 1 to 2 tablets p.o. q.4 to 6 hours p.r.n. pain.  3. Multivitamin 1 tablet p.o. daily.   DISCHARGE CONDITION: Improved, stable.   DISCHARGE ACTIVITY: As tolerated.   DISCHARGE DIET: Soft, easy to digest over the next week and then transition to regular diet thereafter as tolerated.   DISCHARGE INSTRUCTIONS:  1. Take medications as prescribed.  2. Return to the Emergency Department for recurrence of symptoms or for redevelopment of any chest pain, abdominal pain, nausea, vomiting, dark or black-colored stools, or blood in your stool.   DO NOT TAKE: Do not take any NSAIDs including Mobic, meloxicam, ibuprofen, Advil, Aleve, aspirin, BC or Goody powders.   FOLLOW-UP INSTRUCTIONS:  1. Follow-up with Dr. Mechele Collin within 2 to 3 weeks.  2. Follow-up with Dr. Juanetta Gosling within 1 to 2 weeks.  PROCEDURES:  1. Chest x-ray, PA and lateral, 03/20/2012, no acute cardiopulmonary abnormalities are noted.  2. CT of abdomen and pelvis with contrast 03/20/2012 marked dilatation of the stomach but no CT evidence reflecting the sequelae of a mechanical  obstruction. Functional obstruction cannot be excluded. The sinus may represent sequelae of gastritis associated with gastric dysmotility. NG tube placement is recommended if clinically warranted. No further evidence of obstructive or inflammatory abnormalities. Trace amount of fluid appreciated within the pelvis. 3. Three-way abdominal x-ray 03/21/2012 nonobstructive nonspecific bowel gas pattern. The patient's stomach is significantly decompressed when correlated with prior CT. Chest radiograph without evidence of acute cardiopulmonary abnormalities. 4. 2-D echocardiogram with bubble study 03/21/2012 negative bubble study. Contrast injection was performed. Negative bubble study. Left ventricle is grossly normal in size. There is no thrombus. LV systolic function normal. EF greater than 55%. Normal LV wall thickness and wall motion. Right ventricle is grossly normal in size. RV systolic function is normal. 5. EGD performed by Dr. Mechele Collin 03/22/2012 normal esophagus, gastric ulcer with clean base, normal examined duodenum. In regards to the gastric ulcer, there was a nonbleeding cratered gastric ulcer with no stigmata of bleeding found in the gastric body and on the posterior wall of the gastric body. Lesion was 8 to 10 mm in largest dimension. Surrounding inflammation caused the ulcer to be surrounded by inflamed gastric mucosa with edema and granular tissue. Biopsy was done twice. Path report pending at the time of discharge for which the patient is to follow-up with Dr. Mechele Collin in the office to obtain results.   PERTINENT LABORATORY DATA: Stool was negative for occult blood. Cardiac enzymes negative x3 sets. LFTs normal on admission with the exception of total bilirubin of 1.5; total bilirubin 2.5 on the day of discharge. Normal AST, ALT, and alkaline phosphatase levels. Urine drug screen was negative. CBC normal  on admission. Hemoglobin and hematocrit normal on the day of discharge. WBC 2.9 and platelet  count 143 on the day of discharge.   EKG on admission normal sinus rhythm, heart rate 78 beats per minute, normal axis, normal intervals, no acute ST or T changes. Unremarkable EKG.  Serum glucose 94 on admission; 59 on 03/22/2012; normal at 80 on the day of discharge.  HOSPITAL COURSE: The patient is a pleasant 24 year old male without significant past medical history who presented to the Emergency Department with complaints of chest pain as well as melena and also some diarrhea initially. Please see dictated admission history and physical for pertinent details surrounding the onset of this hospitalization. Please see below for further details.  1. Chest pain and melena felt to be from NSAID-induced gastric ulcer. As above, the patient was on meloxicam (Mobic) at the time of admission. He was experiencing chest pain as an outpatient and had been prescribed meloxicam. Apparently the patient stated that his former primary care physician notified him that he had a "hole" in his heart after an echocardiogram was performed and thereafter he was prescribed some NSAIDs. We did obtain 2-D echocardiogram reports from Dr. Carron BrazenNiemeyer's office and there was no mention of any ASD, VSD, or PFOs. Apparently the patient was also told by Dr. Lacie ScottsNiemeyer that patient has a ruptured disk in his back but the patient stated that he never complained of any back pain or had any radiographs involving his back performed. The patient was taking Mobic as prescribed by Dr. Lacie ScottsNiemeyer on a scheduled basis and shortly thereafter started to notice worsening chest pain and also some melena and diarrhea for which he came to the hospital. He underwent a CT scan of his abdomen which revealed significant gastric distention. The patient initially required insertion of NG tube for decompression. Prior to this being done he was kept n.p.o. and started on IV fluids, IV Protonix, and given pain control. With NG tube decompression as well as Protonix, his  overall clinical condition had significantly improved. Post NG tube decompression repeat abdominal radiographs were obtained revealing significant improvement of the patient's gastric distention. GI was consulted and Dr. Mechele CollinElliott recommended EGD for further assessment of the patient's chest pain and melena as well as gastric distention with strong concern for NSAID-induced event since the patient was on meloxicam. The patient went for EGD on the date mentioned above and was noted to have a gastric ulcer without stigmata of any recent bleeding. Also, stool sample was sent and was negative for occult blood. Both hemoglobin and hematocrit have remained within normal limits for the entirety of his hospitalization. The patient was felt to have an NSAID-induced gastric ulcer from meloxicam/Mobic use. He was advised to no longer take any NSAIDs in the future. Once his condition was noted to have improved, he was transitioned off the IV Protonix drip onto b.i.d. IV PPI as per Dr. Earnest ConroyElliott's recommendation and was slowly started on a diet which was initially liquid and the patient did well. Thereafter, he was transitioned to a full liquid diet and continued to do well. The next day the patient was transitioned from a full liquid to a soft diet which he also tolerated well and thereafter his Protonix was switched from IV to p.o. Biopsies were taken from the patient's gastric ulcer and pathology report was pending at the time of discharge and the patient will follow-up with Dr. Mechele CollinElliott in the office to follow-up on the pathology reports. Otherwise, the patient was advised  to continue taking b.i.d. PPI therapy at home until further advised by Dr. Mechele Collin and was also advised to no longer use any NSAIDs in the future.  2. In regards to the patient's questionable or unspecified "hole" in his heart, we obtained a repeat 2-D echocardiogram with bubble study during this hospitalization which was completely unremarkable and there is  no evidence of any abnormalities including any ASD, VSD, or PFOs. The patient was reassured that there were no structural cardiac abnormalities as per echocardiogram reports. Additionally, etiology of his chest pain was felt to be noncardiac at this time given unremarkable EKG, negative cardiac enzymes, and unremarkable echocardiogram. His chest pain was felt to be due to his gastric ulcer and with NG tube decompression and PPI therapy his chest pain has resolved and has not recurred and the patient is chest pain free at the time of discharge at rest and also with ambulation. He was noted to have some abnormal LFTs, specifically hyperbilirubinemia, which is asymptomatic and, per GI, this may represent Gilbert's syndrome. The patient will need to have this closely monitored as an outpatient but at this time he is asymptomatic. We did see some rise of his bilirubin which can be somewhat expected after a GI event such as an ulcer per Dr. Mechele Collin.  3. The patient also had one day of hypoglycemia which was asymptomatic and this was likely from him being n.p.o. and once he was started on a diet hypoglycemia had resolved.  4. He was also noted to be slightly leukopenic and thrombocytopenic and suspect this could be dilutional and now that the patient has been weaned off fluids his WBC and platelet count should improve and normalize as he came in with a completely normal CBC. He can have this further followed and monitored as an outpatient by his primary care physician. He is afebrile at the time of discharge. There was no active bleeding noted and no indications for platelet transfusion.   The patient is no longer having melena. He did have a bowel movement on the day of discharge and reported as soft, formed stool which is brown in color, non-melenic and nonbloody. Additionally, his diarrhea had also resolved, therefore, stool studies were not obtained. On 03/24/2012 the patient was hemodynamically stable and without  any chest pain, melena, diarrhea, or any complaints whatsoever and was tolerating a soft diet well without any complications and was felt to be stable for discharge home with close outpatient follow-up to which the patient was agreeable.    TIME SPENT ON DISCHARGE: Greater than 30 minutes.   ____________________________ Elon Alas, MD knl:drc D: 03/28/2012 18:00:17 ET T: 03/29/2012 13:41:55 ET JOB#: [id number]  cc: Elon Alas, MD, <Dictator> Janeann Forehand., MD Scot Jun, MD Elon Alas MD ELECTRONICALLY SIGNED 04/05/2012 15:09

## 2015-04-20 ENCOUNTER — Ambulatory Visit (INDEPENDENT_AMBULATORY_CARE_PROVIDER_SITE_OTHER): Payer: Commercial Managed Care - PPO | Admitting: Nurse Practitioner

## 2015-04-20 ENCOUNTER — Encounter: Payer: Self-pay | Admitting: Nurse Practitioner

## 2015-04-20 VITALS — BP 102/64 | HR 71 | Temp 97.7°F | Resp 12 | Ht 74.0 in | Wt 151.4 lb

## 2015-04-20 DIAGNOSIS — R21 Rash and other nonspecific skin eruption: Secondary | ICD-10-CM

## 2015-04-20 MED ORDER — TRIAMCINOLONE ACETONIDE 0.1 % EX CREA: 1.0000 "application " | TOPICAL_CREAM | Freq: Two times a day (BID) | CUTANEOUS | Status: DC

## 2015-04-20 NOTE — Patient Instructions (Signed)
Benadryl at night, kenalog cream twice daily on itchy spots.   We will contact you about your referral to dermatology in a week or two.

## 2015-04-20 NOTE — Progress Notes (Signed)
   Subjective:    Patient ID: Lenox PondsJerry L Gillispie, male    DOB: 1991-03-16, 24 y.o.   MRN: [id number]  HPI  Mr. Katrinka BlazingSmith is a 24 yo male with a CC of itchy rash on body x 4 months.   1) Rash comes and goes since Feb., appears as small scabs or erythematous flat areas in multiple areas: Neck, hands, arms, legs  Pt reports they disappear and pop up in different areas each day. Cortizone, benadryl, and calamine lotion were not helpful. No pets at home, denies changes to detergent or body washes. 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 He was not mowing at the time this was found in Feb.   After looking at back of hands and noticing an eczema-looking rash, I asked if he was dx with eczema or has had in the family and both mother and son denied.    Review of Systems  Constitutional: Negative for fever, chills, diaphoresis and fatigue.  Eyes: Negative for visual disturbance.  Respiratory: Negative for chest tightness, shortness of breath and wheezing.   Skin: Positive for rash.  Neurological: Negative for dizziness, weakness and numbness.  Psychiatric/Behavioral: The patient is not nervous/anxious.       Objective:   Physical Exam  Constitutional: He is oriented to person, place, and time. He appears well-developed and well-nourished. No distress.  BP 102/64 mmHg  Pulse 71  Temp(Src) 97.7 F (36.5 C) (Oral)  Resp 12  Ht 6\' 2"  (1.88 m)  Wt 151 lb 6.4 oz (68.675 kg)  BMI 19.43 kg/m2  SpO2 99%   HENT:  Head: Normocephalic and atraumatic.  Right Ear: External ear normal.  Left Ear: External ear normal.  Cardiovascular: Regular rhythm.   Neurological: He is alert and oriented to person, place, and time.  Skin: Skin is warm and dry. Rash noted. He is not diaphoretic.  Excoriated areas on skin from pruritus. Small scabs seen on anterior legs (only 2-3) and on arms, back of hands look to have eczema.   Psychiatric: He has a normal mood and affect. His behavior is  normal. Judgment and thought content normal.      Assessment & Plan:

## 2015-04-20 NOTE — Progress Notes (Signed)
Pre visit review using our clinic review tool, if applicable. No additional management support is needed unless otherwise documented below in the visit note. 

## 2015-04-21 NOTE — Assessment & Plan Note (Signed)
Trial of Kenalog cream sent to pharmacy. Will refer to dermatology for possible bx of skin.

## 2015-11-12 ENCOUNTER — Ambulatory Visit (INDEPENDENT_AMBULATORY_CARE_PROVIDER_SITE_OTHER): Payer: 59 | Admitting: Internal Medicine

## 2015-11-12 ENCOUNTER — Encounter: Payer: Self-pay | Admitting: Internal Medicine

## 2015-11-12 VITALS — BP 112/82 | HR 85 | Temp 97.8°F | Ht 74.0 in | Wt 153.5 lb

## 2015-11-12 DIAGNOSIS — Z79899 Other long term (current) drug therapy: Secondary | ICD-10-CM | POA: Diagnosis not present

## 2015-11-12 DIAGNOSIS — Z1159 Encounter for screening for other viral diseases: Secondary | ICD-10-CM | POA: Diagnosis not present

## 2015-11-12 DIAGNOSIS — Z8719 Personal history of other diseases of the digestive system: Secondary | ICD-10-CM | POA: Diagnosis not present

## 2015-11-12 DIAGNOSIS — Z8711 Personal history of peptic ulcer disease: Secondary | ICD-10-CM

## 2015-11-12 DIAGNOSIS — B351 Tinea unguium: Secondary | ICD-10-CM | POA: Diagnosis not present

## 2015-11-12 MED ORDER — PANTOPRAZOLE SODIUM 40 MG PO TBEC: 40.0000 mg | DELAYED_RELEASE_TABLET | Freq: Every day | ORAL | Status: DC

## 2015-11-12 NOTE — Patient Instructions (Addendum)
Resume your protonix daily .  If you stomach pains do not resolve, in 4 weeks, let me know   Your fingernail changes may be due to a fungal infection.  i will prescribe a medication once I have reviewed your liver enzymes to make sure it is safe to prescribe

## 2015-11-12 NOTE — Progress Notes (Signed)
Pre visit review using our clinic review tool, if applicable. No additional management support is needed unless otherwise documented below in the visit note. 

## 2015-11-12 NOTE — Assessment & Plan Note (Signed)
Has been off of protonix for a year due to lack of iinsurance .  Since he stopped the PPI has been having post prandial gastritis after eating  Esp with  salads and popcorn .  Drinks several days   per day

## 2015-11-12 NOTE — Progress Notes (Signed)
Subjective:  Patient ID: Christian Montgomery, male    DOB: 08-03-1991  Age: 24 y.o. MRN: [id number]  CC: The primary encounter diagnosis was Long-term use of high-risk medication. Diagnoses of Need for hepatitis C screening test, Personal history of gastric ulcer, and Tinea of nail were also pertinent to this visit.  HPI Christian Montgomery presents for evaluation of recent changes to several nails on one hand,  Associated with color change,  Yellow scaling and thickening of nail bed,  Nails hurst if you press on it   Started about 6 months aog ,  And progressively worse for the last 2 months .Marland Kitchen.  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    Nail changes started after he started wearing the gloves,  An dhands started peeling after he started wearing  The gloves.   Outpatient Prescriptions Prior to Visit  Medication Sig Dispense Refill  . triamcinolone cream (KENALOG) 0.1 % Apply 1 application topically 2 (two) times daily. (Patient not taking: Reported on 11/12/2015) 30 g 0   No facility-administered medications prior to visit.    Review of Systems;  Patient denies headache, fevers, malaise, unintentional weight loss, skin rash, eye pain, sinus congestion and sinus pain, sore throat, dysphagia,  hemoptysis , cough, dyspnea, wheezing, chest pain, palpitations, orthopnea, edema, abdominal pain, nausea, melena, diarrhea, constipation, flank pain, dysuria, hematuria, urinary  Frequency, nocturia, numbness, tingling, seizures,  Focal weakness, Loss of consciousness,  Tremor, insomnia, depression, anxiety, and suicidal ideation.      Objective:  BP 112/82 mmHg  Pulse 85  Temp(Src) 97.8 F (36.6 C) (Oral)  Ht 6\' 2"  (1.88 m)  Wt 153 lb 8 oz (69.627 kg)  BMI 19.70 kg/m2  SpO2 99%  BP Readings from Last 3 Encounters:  11/12/15 112/82  04/20/15 102/64  12/26/14 102/70    Wt Readings from Last 3 Encounters:  11/12/15 153  lb 8 oz (69.627 kg)  04/20/15 151 lb 6.4 oz (68.675 kg)  12/26/14 158 lb (71.668 kg)    General appearance: alert, cooperative and appears stated age Ears: normal TM's and external ear canals both ears Throat: lips, mucosa, and tongue normal; teeth and gums normal Neck: no adenopathy, no carotid bruit, supple, symmetrical, trachea midline and thyroid not enlarged, symmetric, no tenderness/mass/nodules Back: symmetric, no curvature. ROM normal. No CVA tenderness. Lungs: clear to auscultation bilaterally Heart: regular rate and rhythm, S1, S2 normal, no murmur, click, rub or gallop Abdomen: soft, non-tender; bowel sounds normal; no masses,  no organomegaly Pulses: 2+ and symmetric Skin: Skin color, texture, turgor normal. No rashes or lesions Lymph nodes: Cervical, supraclavicular, and axillary nodes normal.  Lab Results  Component Value Date   HGBA1C 5.4 05/27/2013   HGBA1C 5.4 09/21/2012    Lab Results  Component Value Date   CREATININE 1.02 11/12/2015   CREATININE 0.8 01/12/2014   CREATININE 0.9 12/02/2013    Lab Results  Component Value Date   WBC 4.4 06/15/2014   HGB 13.6 06/15/2014   HCT 40.9 06/15/2014   PLT 156 06/15/2014   GLUCOSE 79 11/12/2015   CHOL 118 01/12/2014   TRIG 29.0 01/12/2014   HDL 46.40 01/12/2014   LDLCALC 66 01/12/2014   ALT 12 11/12/2015   AST 15 11/12/2015   NA 142 11/12/2015   K 3.9 11/12/2015   CL 102 11/12/2015   CREATININE 1.02 11/12/2015   BUN 11 11/12/2015  CO2 32 11/12/2015   INR 1.3* 02/24/2014   HGBA1C 5.4 05/27/2013    No results found.  Assessment & Plan:   Problem List Items Addressed This Visit    Tinea of nail    Suspected, given history and exam.  Affecting only 2 nails,   Trial of jublia   Lab Results  Component Value Date   ALT 12 11/12/2015   AST 15 11/12/2015   ALKPHOS 61 11/12/2015   BILITOT 1.3* 11/12/2015         Relevant Medications   Efinaconazole 10 % SOLN   Personal history of gastric ulcer     Has been off of protonix for a year due to lack of iinsurance .  Since he stopped the PPI has been having post prandial gastritis after eating  Esp with  salads and popcorn .  Drinks several days   per day        Other Visit Diagnoses    Long-term use of high-risk medication    -  Primary    Relevant Orders    Comprehensive metabolic panel (Completed)    Need for hepatitis C screening test        Relevant Orders    Hepatitis C antibody       I am having [name] start on pantoprazole and Efinaconazole. I am also having him maintain his triamcinolone cream.  Meds ordered this encounter  Medications  . pantoprazole (PROTONIX) 40 MG tablet    Sig: Take 1 tablet (40 mg total) by mouth daily.    Dispense:  90 tablet    Refill:  3  . Efinaconazole 10 % SOLN    Sig: Apply once daily to affected nails    Dispense:  4 mL    Refill:  3    There are no discontinued medications.  Follow-up: Return in about 3 months (around 02/10/2016).   Sherlene Shams, MD

## 2015-11-13 DIAGNOSIS — B351 Tinea unguium: Secondary | ICD-10-CM | POA: Insufficient documentation

## 2015-11-13 LAB — COMPREHENSIVE METABOLIC PANEL
ALBUMIN: 4.8 g/dL (ref 3.5–5.2)
ALT: 12 U/L (ref 0–53)
AST: 15 U/L (ref 0–37)
Alkaline Phosphatase: 61 U/L (ref 39–117)
BUN: 11 mg/dL (ref 6–23)
CHLORIDE: 102 meq/L (ref 96–112)
CO2: 32 mEq/L (ref 19–32)
CREATININE: 1.02 mg/dL (ref 0.40–1.50)
Calcium: 9.8 mg/dL (ref 8.4–10.5)
GFR: 93.17 mL/min (ref >60.00)
GLUCOSE: 79 mg/dL (ref 70–99)
Potassium: 3.9 mEq/L (ref 3.5–5.1)
SODIUM: 142 meq/L (ref 135–145)
Total Bilirubin: 1.3 mg/dL — ABNORMAL HIGH (ref 0.2–1.2)
Total Protein: 6.9 g/dL (ref 6.0–8.3)

## 2015-11-13 MED ORDER — EFINACONAZOLE 10 % EX SOLN: CUTANEOUS | Status: DC

## 2015-11-13 NOTE — Assessment & Plan Note (Addendum)
Suspected, given history and exam.  Affecting only 2 nails,   Trial of jublia   Lab Results  Component Value Date   ALT 12 11/12/2015   AST 15 11/12/2015   ALKPHOS 61 11/12/2015   BILITOT 1.3* 11/12/2015

## 2015-11-14 ENCOUNTER — Other Ambulatory Visit: Payer: Self-pay | Admitting: Internal Medicine

## 2015-11-14 ENCOUNTER — Encounter: Payer: Self-pay | Admitting: Internal Medicine

## 2015-11-14 ENCOUNTER — Telehealth: Payer: Self-pay | Admitting: *Deleted

## 2015-11-14 ENCOUNTER — Ambulatory Visit: Payer: Self-pay | Admitting: Internal Medicine

## 2015-11-14 LAB — HEPATITIS C ANTIBODY: HCV Ab: NEGATIVE

## 2015-11-14 MED ORDER — TERBINAFINE HCL 250 MG PO TABS: 250.0000 mg | ORAL_TABLET | Freq: Every day | ORAL | Status: DC

## 2015-11-14 NOTE — Addendum Note (Signed)
Addended by: Sherlene ShamsULLO, Kaityln Kallstrom L on: 11/14/2015 04:16 PM   Modules accepted: Orders, Medications

## 2015-11-14 NOTE — Telephone Encounter (Signed)
Alternative sent,  Pill form ,  Once daily for 6 week s.  Follow up appt needed 6 weeks

## 2015-11-14 NOTE — Telephone Encounter (Signed)
Patient was prescribed Efinaconzole, patient stated that the medication was costly. He requested a less expensive prescription. Please Advise

## 2015-11-14 NOTE — Telephone Encounter (Signed)
Please advise refill? 

## 2015-11-15 ENCOUNTER — Ambulatory Visit: Payer: Self-pay | Admitting: Internal Medicine

## 2015-11-15 ENCOUNTER — Telehealth: Payer: Self-pay | Admitting: *Deleted

## 2015-12-14 ENCOUNTER — Other Ambulatory Visit: Payer: Self-pay | Admitting: Internal Medicine

## 2015-12-28 ENCOUNTER — Ambulatory Visit (INDEPENDENT_AMBULATORY_CARE_PROVIDER_SITE_OTHER): Payer: 59 | Admitting: Internal Medicine

## 2015-12-28 ENCOUNTER — Encounter: Payer: Self-pay | Admitting: Internal Medicine

## 2015-12-28 VITALS — BP 118/78 | HR 71 | Temp 98.1°F | Resp 12 | Ht 74.0 in | Wt 154.0 lb

## 2015-12-28 DIAGNOSIS — Z8711 Personal history of peptic ulcer disease: Secondary | ICD-10-CM

## 2015-12-28 DIAGNOSIS — B351 Tinea unguium: Secondary | ICD-10-CM

## 2015-12-28 DIAGNOSIS — Z79899 Other long term (current) drug therapy: Secondary | ICD-10-CM

## 2015-12-28 DIAGNOSIS — K589 Irritable bowel syndrome without diarrhea: Secondary | ICD-10-CM

## 2015-12-28 DIAGNOSIS — Z8719 Personal history of other diseases of the digestive system: Secondary | ICD-10-CM | POA: Diagnosis not present

## 2015-12-28 MED ORDER — DICYCLOMINE HCL 10 MG PO CAPS: 10.0000 mg | ORAL_CAPSULE | Freq: Three times a day (TID) | ORAL | Status: DC

## 2015-12-28 NOTE — Patient Instructions (Signed)
Your abdominal cramping may be due to "irritable Bowel" syndrome  The dicyclomine is an "anti-spasmodic" that should be taken 20 minutes or so before you eat to prevent spasms and diarrhea  2) the nails are looking better. It will probably take another 2 or 3 months of Lamisil  to finish growing out healthy nail  We are checking your liver enzymes today to make sure the medication isn't  Irritating your liver  Continue taking your protonix   Diet for Irritable Bowel Syndrome When you have irritable bowel syndrome (IBS), the foods you eat and your eating habits are very important. IBS may cause various symptoms, such as abdominal pain, constipation, or diarrhea. Choosing the right foods can help ease discomfort caused by these symptoms. Work with your health care provider and dietitian to find the best eating plan to help control your symptoms. WHAT GENERAL GUIDELINES DO I NEED TO FOLLOW?  Keep a food diary. This will help you identify foods that cause symptoms. Write down:  What you eat and when.  What symptoms you have.  When symptoms occur in relation to your meals.  Avoid foods that cause symptoms. Talk with your dietitian about other ways to get the same nutrients that are in these foods.  Eat more foods that contain fiber. Take a fiber supplement if directed by your dietitian.  Eat your meals slowly, in a relaxed setting.  Aim to eat 5-6 small meals per day. Do not skip meals.  Drink enough fluids to keep your urine clear or pale yellow.  Ask your health care provider if you should take an over-the-counter probiotic during flare-ups to help restore healthy gut bacteria.  If you have cramping or diarrhea, try making your meals low in fat and high in carbohydrates. Examples of carbohydrates are pasta, rice, whole grain breads and cereals, fruits, and vegetables.  If dairy products cause your symptoms to flare up, try eating less of them. You might be able to handle yogurt  better than other dairy products because it contains bacteria that help with digestion. WHAT FOODS ARE NOT RECOMMENDED? The following are some foods and drinks that may worsen your symptoms:  Fatty foods, such as [place] fries.  Milk products, such as cheese or ice cream.  Chocolate.  Alcohol.  Products with caffeine, such as coffee.  Carbonated drinks, such as soda. The items listed above may not be a complete list of foods and beverages to avoid. Contact your dietitian for more information. WHAT FOODS ARE GOOD SOURCES OF FIBER? Your health care provider or dietitian may recommend that you eat more foods that contain fiber. Fiber can help reduce constipation and other IBS symptoms. Add foods with fiber to your diet a little at a time so that your body can get used to them. Too much fiber at once might cause gas and swelling of your abdomen. The following are some foods that are good sources of fiber:  Apples.  Peaches.  Pears.  Berries.  Figs.  Broccoli (raw).  Cabbage.  Carrots.  Raw peas.  Kidney beans.  Lima beans.  Whole grain bread.  Whole grain cereal. FOR MORE INFORMATION  International Foundation for Functional Gastrointestinal Disorders: www.iffgd.[employer] of Diabetes and Digestive and Kidney Diseases: http://norris-lawson.com/.aspx   This information is not intended to replace advice given to you by your health care provider. Make sure you discuss any questions you have with your health care provider.   Document Released: 02/07/2004 Document Revised: 12/08/2014 Document Reviewed: 02/17/2014  Chartered certified accountant Patient Education [employer].

## 2015-12-28 NOTE — Progress Notes (Signed)
Pre-visit discussion using our clinic review tool. No additional management support is needed unless otherwise documented below in the visit note.  

## 2015-12-28 NOTE — Progress Notes (Signed)
Subjective:  Patient ID: Christian Montgomery, male    DOB: 06-09-1991  Age: 25 y.o. MRN: [id number]  CC: The primary encounter diagnosis was Long-term use of high-risk medication. Diagnoses of Gilbert's disease, Personal history of gastric ulcer, Tinea of nail, and IBS (irritable bowel syndrome) were also pertinent to this visit.  HPI Christian Montgomery presents for  one month  follow up on recurrent gastritis with history of ulcer, GERD and onychomycosis of fingernails secondary to prolonged immersion of hands in water.  His gastritis has improved with resuming protonix, however he has been experiencing post prandial cramping occasionally occurring nlaly after large,  Or greasy mealsas well as after salads. No nausea.    No change in stools.  Appetite is fine, no weight loss.   Nails are growing out from the cuticle normally,   The onychomycosis is now limited to the distal 1/3 of the nailbeds.  No side effects from the lamisil.     Outpatient Prescriptions Prior to Visit  Medication Sig Dispense Refill  . pantoprazole (PROTONIX) 40 MG tablet Take 1 tablet (40 mg total) by mouth daily. 90 tablet 3  . terbinafine (LAMISIL) 250 MG tablet TAKE ONE (1) TABLET EACH DAY FOR SIX WEEKS 42 tablet 3  . triamcinolone cream (KENALOG) 0.1 % Apply 1 application topically 2 (two) times daily. (Patient not taking: Reported on 11/12/2015) 30 g 0   No facility-administered medications prior to visit.    Review of Systems;  Patient denies headache, fevers, malaise, unintentional weight loss, skin rash, eye pain, sinus congestion and sinus pain, sore throat, dysphagia,  hemoptysis , cough, dyspnea, wheezing, chest pain, palpitations, orthopnea, edema, abdominal pain, nausea, melena, diarrhea, constipation, flank pain, dysuria, hematuria, urinary  Frequency, nocturia, numbness, tingling, seizures,  Focal weakness, Loss of consciousness,  Tremor, insomnia, depression, anxiety, and suicidal ideation.      Objective:    BP 118/78 mmHg  Pulse 71  Temp(Src) 98.1 F (36.7 C) (Oral)  Resp 12  Ht  (1.88 m)  Wt 154 lb (69.854 kg)  BMI 19.76 kg/m2  SpO2 99%  BP Readings from Last 3 Encounters:  12/28/15 118/78  11/12/15 112/82  04/20/15 102/64    Wt Readings from Last 3 Encounters:  12/28/15 154 lb (69.854 kg)  11/12/15 153 lb 8 oz (69.627 kg)  04/20/15 151 lb 6.4 oz (68.675 kg)    General appearance: alert, cooperative and appears stated age Neck: no adenopathy, no carotid bruit, supple, symmetrical, trachea midline and thyroid not enlarged, symmetric, no tenderness/mass/nodules Lungs: clear to auscultation bilaterally Heart: regular rate and rhythm, S1, S2 normal, no murmur, click, rub or gallo Abdomen: soft, non-tender; bowel sounds normal; no masses,  no organomegaly Pulses: 2+ and symmetric Skin: Skin color, texture, turgor normal. No rashes or lesions, no jaundice Nails: yellowing and opacification of distal 1/3 of nallbeds, fingers  2-3 bilaterally  Lymph nodes: Cervical, supraclavicular, and axillary nodes normal.  Lab Results  Component Value Date   HGBA1C 5.4 05/27/2013   HGBA1C 5.4 09/21/2012    Lab Results  Component Value Date   CREATININE 1.02 11/12/2015   CREATININE 0.8 01/12/2014   CREATININE 0.9 12/02/2013    Lab Results  Component Value Date   WBC 4.4 06/15/2014   HGB 13.6 06/15/2014   HCT 40.9 06/15/2014   PLT 156 06/15/2014   GLUCOSE 79 11/12/2015   CHOL 118 01/12/2014   TRIG 29.0 01/12/2014   HDL 46.40 01/12/2014   LDLCALC 66 01/12/2014  ALT 19 12/28/2015   AST 19 12/28/2015   NA 142 11/12/2015   K 3.9 11/12/2015   CL 102 11/12/2015   CREATININE 1.02 11/12/2015   BUN 11 11/12/2015   CO2 32 11/12/2015   INR 1.3* 02/24/2014   HGBA1C 5.4 05/27/2013    No results found.  Assessment & Plan:   Problem List Items Addressed This Visit    Gilbert's disease    Wilson's Disease and all other autoimmune and viral causes were ruled out  with second  opinion by [facility] liver specialist.         Personal history of gastric ulcer    Advised to continue protonix indefinitely given recurrence of gastritis symptoms without PPI use.       Tinea of nail    Improving after one month of lamisil. Liver enzymes are normal.  Continue for 2 more months.      IBS (irritable bowel syndrome)    Suggested by history.  Prioer EGD nad colonoscopy and HIDA scan in 2013. Trial of bentyl       Relevant Medications   dicyclomine (BENTYL) 10 MG capsule    Other Visit Diagnoses    Long-term use of high-risk medication    -  Primary    Relevant Orders    Hepatic function panel (Completed)       I am having [name] start on dicyclomine. I am also having him maintain his triamcinolone cream, pantoprazole, and terbinafine.  Meds ordered this encounter  Medications  . dicyclomine (BENTYL) 10 MG capsule    Sig: Take 1 capsule (10 mg total) by mouth 4 (four) times daily -  before meals and at bedtime.    Dispense:  120 capsule    Refill:  1   A total of 25 minutes of face to face time was spent with patient more than half of which was spent in counselling about the above mentioned conditions  and coordination of care  There are no discontinued medications.  Follow-up: No Follow-up on file.   Sherlene Shams, MD

## 2015-12-29 ENCOUNTER — Encounter: Payer: Self-pay | Admitting: Internal Medicine

## 2015-12-29 LAB — HEPATIC FUNCTION PANEL
ALBUMIN: 4.5 g/dL (ref 3.6–5.1)
ALT: 19 U/L (ref 9–46)
AST: 19 U/L (ref 10–40)
Alkaline Phosphatase: 55 U/L (ref 40–115)
BILIRUBIN DIRECT: 0.4 mg/dL — AB (ref <=0.2)
Indirect Bilirubin: 1.4 mg/dL — ABNORMAL HIGH (ref 0.2–1.2)
TOTAL PROTEIN: 6.8 g/dL (ref 6.1–8.1)
Total Bilirubin: 1.8 mg/dL — ABNORMAL HIGH (ref 0.2–1.2)

## 2015-12-30 ENCOUNTER — Encounter: Payer: Self-pay | Admitting: Internal Medicine

## 2015-12-30 DIAGNOSIS — K589 Irritable bowel syndrome without diarrhea: Secondary | ICD-10-CM | POA: Insufficient documentation

## 2015-12-30 NOTE — Assessment & Plan Note (Signed)
Advised to continue protonix indefinitely given recurrence of gastritis symptoms without PPI use.

## 2015-12-30 NOTE — Assessment & Plan Note (Addendum)
Wilson's Disease and all other autoimmune and viral causes were ruled out  with second opinion by [facility] liver specialist.

## 2015-12-30 NOTE — Assessment & Plan Note (Signed)
Improving after one month of lamisil. Liver enzymes are normal.  Continue for 2 more months.

## 2015-12-30 NOTE — Assessment & Plan Note (Signed)
Suggested by history.  Prioer EGD nad colonoscopy and HIDA scan in 2013. Trial of bentyl

## 2016-02-05 ENCOUNTER — Telehealth: Payer: Self-pay

## 2016-02-05 NOTE — Telephone Encounter (Signed)
[facility] seen [facility] for sprained index right finger. Pt wants to know if the meloxicam 15mg  he was prescribed for pain at the urgent care will interact with medication he is already taking. Pt states that he did not tell them what medications he was taking when he was seen for his finger.Please advise, thanks

## 2016-02-07 ENCOUNTER — Ambulatory Visit: Payer: 59 | Admitting: Family Medicine

## 2016-02-07 NOTE — Telephone Encounter (Signed)
No, they will not interact.  But meloxicam can cause gastritis if taken for long perids of time so make sure he contineus to take his pantoprazole daily

## 2016-02-08 NOTE — Telephone Encounter (Signed)
Notified pt of Dr. Melina Schoolsullo's comment, pt verbalized understanding

## 2016-03-06 ENCOUNTER — Ambulatory Visit: Payer: Self-pay | Admitting: Family Medicine

## 2016-03-07 ENCOUNTER — Ambulatory Visit: Payer: 59 | Admitting: Family Medicine

## 2016-03-11 ENCOUNTER — Ambulatory Visit
Admission: RE | Admit: 2016-03-11 | Discharge: 2016-03-11 | Disposition: A | Discharge status: Home / Self Care | Payer: 59 | Source: Ambulatory Visit | Attending: Family Medicine | Admitting: Family Medicine

## 2016-03-11 ENCOUNTER — Ambulatory Visit (INDEPENDENT_AMBULATORY_CARE_PROVIDER_SITE_OTHER): Payer: 59 | Admitting: Family Medicine

## 2016-03-11 ENCOUNTER — Encounter: Payer: Self-pay | Admitting: Family Medicine

## 2016-03-11 VITALS — BP 106/72 | HR 73 | Temp 98.2°F | Ht 72.2 in | Wt 155.8 lb

## 2016-03-11 DIAGNOSIS — M79641 Pain in right hand: Secondary | ICD-10-CM

## 2016-03-11 NOTE — Progress Notes (Signed)
Patient ID: Christian Montgomery, male   DOB: 1991/08/27, 25 y.o.   MRN: [id number]  Christian AlarEric Champayne Kocian, MD Phone: [phone]  Christian Montgomery is a 25 y.o. male who presents today for same-day visit.  Right index finger injury: Patient notes in January he was at work when he hyperextended his right index finger. He was evaluated in urgent care and had x-rays though he reports these were negative. They diagnosed him with a index finger strain. He was placed in a splint. He notes he has been wearing the splint the majority of the time since then. He continues to note soreness and stiffness and pain in his MCP and PIP joints in the index finger. There is swelling in these joints as well. No numbness or tingling. No weakness. No recurrent injury. He was taking Mobic though this did not help. Pain has remained at about a 5 out of 10.  PMH: nonsmoker.   ROS see history of present illness   Objective  Physical Exam Filed Vitals:   03/11/16 0844  BP: 106/72  Pulse: 73  Temp: 98.2 F (36.8 C)    BP Readings from Last 3 Encounters:  03/11/16 106/72  12/28/15 118/78  11/12/15 112/82   Wt Readings from Last 3 Encounters:  03/11/16 155 lb 12.8 oz (70.67 kg)  12/28/15 154 lb (69.854 kg)  11/12/15 153 lb 8 oz (69.627 kg)    Physical Exam  Constitutional: He is well-developed, well-nourished, and in no distress.  Cardiovascular: Normal rate, regular rhythm and normal heart sounds.   Musculoskeletal:  Right index finger with mild decreased flexion, full extension, there is mild swelling to the MCP and PIP joints, there is mild tenderness of these 2 joints, there is no phalanx tenderness of the finger, the finger is warm and well perfused with good capillary refill, thank you no tenderness or swelling of the rest of his hand, left hand with no tenderness or swelling  Neurological: He is alert.  5 out of 5 strength in flexion and extension of right index and left index fingers, and grip, sensation to  light touch intact in bilateral hands  Skin: He is not diaphoretic.     Assessment/Plan: Please see individual problem list.  Right hand pain Patient with persistent pain in his right index finger after injury in January. Does have noticeable swelling and does have tenderness of the joints. Given persistence we will reimage his hand with an x-ray. We will refer him to sports medicine or orthopedics depending on what the x-ray reveals. He will continue to monitor. Once x-ray returns we will send in medication for pain control. He is given return precautions.    Orders Placed This Encounter  Procedures  . DG Hand Complete Right    Standing Status: Future     Number of Occurrences: 1     Standing Expiration Date: 05/11/2017    Order Specific Question:  Reason for Exam (SYMPTOM  OR DIAGNOSIS REQUIRED)    Answer:  right index finger hyperextension in January, continues to have pain and swelling in MCP and PIP right index finger    Order Specific Question:  Preferred imaging location?    Answer:  [facility]    No orders of the defined types were placed in this encounter.    Christian AlarEric Zoa Dowty, MD [facility]

## 2016-03-11 NOTE — Assessment & Plan Note (Signed)
Patient with persistent pain in his right index finger after injury in January. Does have noticeable swelling and does have tenderness of the joints. Given persistence we will reimage his hand with an x-ray. We will refer him to sports medicine or orthopedics depending on what the x-ray reveals. He will continue to monitor. Once x-ray returns we will send in medication for pain control. He is given return precautions.

## 2016-03-11 NOTE — Progress Notes (Signed)
Pre visit review using our clinic review tool, if applicable. No additional management support is needed unless otherwise documented below in the visit note. 

## 2016-03-11 NOTE — Patient Instructions (Signed)
Nice to meet you. We are going to re-x-ray your hand to evaluate your injury further. We're going to refer you to an orthopedist as well. If you develop numbness, tingling, weakness, or worsening pain please seek medical attention immediately.

## 2016-03-12 ENCOUNTER — Other Ambulatory Visit: Payer: Self-pay | Admitting: Family Medicine

## 2016-03-12 DIAGNOSIS — M79641 Pain in right hand: Secondary | ICD-10-CM

## 2016-03-17 ENCOUNTER — Encounter: Payer: Self-pay | Admitting: Family Medicine

## 2016-05-14 ENCOUNTER — Other Ambulatory Visit: Payer: Self-pay | Admitting: Internal Medicine

## 2016-05-31 ENCOUNTER — Encounter: Payer: Self-pay | Admitting: Internal Medicine

## 2016-06-02 MED ORDER — OMEPRAZOLE 40 MG PO CPDR: 40.0000 mg | DELAYED_RELEASE_CAPSULE | Freq: Every day | ORAL | Status: DC

## 2016-06-02 NOTE — Telephone Encounter (Signed)
Insurance refusing Protonix.

## 2016-06-05 ENCOUNTER — Ambulatory Visit: Payer: 59 | Attending: Orthopedic Surgery | Admitting: Occupational Therapy

## 2016-06-05 DIAGNOSIS — R208 Other disturbances of skin sensation: Secondary | ICD-10-CM | POA: Insufficient documentation

## 2016-06-05 DIAGNOSIS — M79641 Pain in right hand: Secondary | ICD-10-CM

## 2016-06-05 DIAGNOSIS — M6281 Muscle weakness (generalized): Secondary | ICD-10-CM

## 2016-06-05 NOTE — Patient Instructions (Signed)
Contrast bath Radial N glide Compression glove at night time  Modify avoid tight grip, lifting or pulling with hand /digits  desentitization soft massage, soft textures, towel   short bursts   2-3 x day

## 2016-06-05 NOTE — Therapy (Signed)
[facility] [address] [place], [place], [address] Phone: [phone]   Fax:  [phone]  Occupational Therapy Treatment  Patient Details  Name: Christian Montgomery MRN: [id number] Date of Birth: 01-07-1991 Referring Provider: Dedra Skeensodd Mundy  Encounter Date: 06/05/2016      OT End of Session - 06/05/16 2129    Visit Number 1   Number of Visits 4   Date for OT Re-Evaluation 07/03/16   OT Start Time 1131   OT Stop Time 1217   OT Time Calculation (min) 46 min   Activity Tolerance Patient tolerated treatment well   Behavior During Therapy [facility] for tasks assessed/performed      Past Medical History  Diagnosis Date  . Gastric ulcer with obstruction April 2013    [facility]    Past Surgical History  Procedure Laterality Date  . Cosmetic surgery      There were no vitals filed for this visit.      Subjective Assessment - 06/05/16 2119    Subjective  Hurt my R index finger about 31st January and cont to have pain in 2nd and thumb around knuckle  and at times numbness - pain increase with use of finger - tight fist , lifting , gripping bother it    Patient Stated Goals Want the pain and numbness in my finger better - like it was before   Currently in Pain? Yes   Pain Score 7    Pain Location Finger (Comment which one)   Pain Orientation Right   Pain Type Neuropathic pain   Pain Onset More than a month ago   Pain Frequency Constant            [facility] OT Assessment - 06/05/16 0001    Assessment   Diagnosis R 2nd digit radial N injury   Referring Provider Dedra Skeensodd Mundy   Onset Date 01/01/16   Home  Environment   Lives With Alone   Prior Function   Level of Independence Independent   Vocation Full time employment   Leisure Production designer, theatre/television/filmManager at [employer], likes to work around the house , yard work , fix things, fishing    Strength   Right Hand Grip (lbs) 65   Right Hand Lateral Pinch 18 lbs   Right Hand 3 Point  Pinch 18 lbs   Left Hand Grip (lbs) 95   Left Hand Lateral Pinch 26 lbs   Left Hand 3 Point Pinch 26 lbs   Right Hand AROM   R Index  MCP 0-90 90 Degrees   R Index PIP 0-100 100 Degrees        contrast done - followed by review of HEP for radial N glide  fitted with compression glove for night time use  And reviewed modifications in using hand and 2nd digit  Desentitization                   OT Education - 06/05/16 2129    Education provided Yes   Education Details See pt instruction   Person(s) Educated Patient   Methods Explanation;Demonstration;Tactile cues;Verbal cues;Handout   Comprehension Verbal cues required;Returned demonstration;Verbalized understanding          OT Short Term Goals - 06/05/16 2133    OT SHORT TERM GOAL #1   Title Pain on PRWHE improve by at least 20 points    Baseline Pain on PRHWE at eval 44/50   Time 3  Period Weeks   Status New           OT Long Term Goals - 06/05/16 2134    OT LONG TERM GOAL #1   Title Grip and prehension  strength in R hand improve by at least 3-5 lbs to use hand with less pain    Baseline grip 65 R, L 95 ; lat and 3 point grip R 18 ; L 26 lbs    Time 4   Period Weeks   Status New   OT LONG TERM GOAL #2   Title Function score on PRWHE improve with at least 15 points    Baseline PRWHE for function at eval 24.5/50   Time 4   Period Weeks   Status New               Plan - 06/05/16 2130    Clinical Impression Statement Pt present about 5 1/2 months out from injury to 2nd digit that resulted also to injury/irritation of super ficial sensory branch of radial N - pt pain mostly with tight grip or using 2nd digit in flexion or pressure - AROM and edema WNL - but girp and prehension  decrease - pt ed on HEP to decreaes pain , modifying activities -    Rehab Potential Good   OT Frequency 1x / week   OT Duration 4 weeks   OT Treatment/Interventions Self-care/ADL training;Contrast  Bath;Fluidtherapy;Ultrasound;Therapeutic exercise;Patient/family education;Splinting;Manual Therapy   Plan assess how doing with HEP - if need splint    OT Home Exercise Plan see pt instruction    Consulted and Agree with Plan of Care Patient      Patient will benefit from skilled therapeutic intervention in order to improve the following deficits and impairments:  Impaired sensation, Increased edema, Impaired UE functional use, Pain, Decreased strength  Visit Diagnosis: Pain in right hand - Plan: Ot plan of care cert/re-cert  Other disturbances of skin sensation - Plan: Ot plan of care cert/re-cert  Muscle weakness (generalized) - Plan: Ot plan of care cert/re-cert    Problem List Patient Active Problem List   Diagnosis Date Noted  . Right hand pain 03/11/2016  . IBS (irritable bowel syndrome) 12/30/2015  . Tinea of nail 11/13/2015  . Rash and nonspecific skin eruption 04/20/2015  . Shingles outbreak 02/24/2014  . Thrombocytopenia (HCC) 02/14/2014  . Bordetella pertussis 12/13/2013  . Leukopenia 12/13/2013  . Dysautonomia/POTS 09/06/2013  . Allergic rhinitis 03/25/2013  . Constipation - functional 10/14/2012  . Gilbert's disease 09/22/2012  . Personal history of gastric ulcer 03/01/2012    Oletta CohnuPreez, Blain Hunsucker OTR/L,CLT  06/05/2016, 9:39 PM  [facility] [address] [place], [place], [address] Phone: [phone]   Fax:  [phone]  Name: Christian Montgomery MRN: [id number] Date of Birth: 05/09/1991

## 2016-06-06 ENCOUNTER — Telehealth: Payer: Self-pay

## 2016-06-06 NOTE — Telephone Encounter (Signed)
PA for Omeprazole completed on Cover my meds.

## 2016-06-06 NOTE — Telephone Encounter (Signed)
PA was denied, this is a plan exclusion, not covered with his prescription drug plan.  No alternatives were given.   I spoke with the patient, he went ahead and paid out of pocket for the Omeprazole as it was only $17.  He will continue to do this.  Thanks

## 2016-06-10 ENCOUNTER — Ambulatory Visit: Payer: 59 | Admitting: Occupational Therapy

## 2016-06-12 ENCOUNTER — Ambulatory Visit: Payer: 59 | Admitting: Occupational Therapy

## 2016-06-12 DIAGNOSIS — M6281 Muscle weakness (generalized): Secondary | ICD-10-CM

## 2016-06-12 DIAGNOSIS — M79641 Pain in right hand: Secondary | ICD-10-CM

## 2016-06-12 DIAGNOSIS — R208 Other disturbances of skin sensation: Secondary | ICD-10-CM

## 2016-06-12 NOTE — Patient Instructions (Signed)
MP block splint on R 2nd for gripping act and sleeping

## 2016-06-12 NOTE — Therapy (Signed)
[facility] [address] [place], [address] Phone: [phone]   Fax:  [phone]  Occupational Therapy Treatment  Patient Details  Name: Christian Montgomery MRN: [id number] Date of Birth: 08/12/1991 Referring Provider: Dedra Skeens  Encounter Date: 06/12/2016      OT End of Session - 06/12/16 1802    Visit Number 2   Number of Visits 4   Date for OT Re-Evaluation 07/03/16   OT Start Time 1720   OT Stop Time 1740   OT Time Calculation (min) 20 min   Activity Tolerance Patient tolerated treatment well   Behavior During Therapy [facility] for tasks assessed/performed      Past Medical History  Diagnosis Date  . Gastric ulcer with obstruction April 2013    [facility]    Past Surgical History  Procedure Laterality Date  . Cosmetic surgery      There were no vitals filed for this visit.      Subjective Assessment - 06/12/16 1759    Subjective  Still pain with making fist - if not have to use it no pain - but with making fist and using it - pain towards the end of day - sometimes at nigit time too - contrast bath just helps temparary     Patient Stated Goals Want the pain and numbness in my finger better - like it was before   Currently in Pain? Yes   Pain Score 5    Pain Location Hand   Pain Orientation Right   Pain Type Neuropathic pain   Pain Onset More than a month ago                      OT Treatments/Exercises (OP) - 06/12/16 0001    Hand Exercises   Other Hand Exercises assess pain not with intrinsic fist but with composite fist and MP flexion with PIP extention - still tinel over dorsal MP 's of 2nd digiit    Splinting   Splinting Fabricated MP block splint for 2nd digit on R hand to wear with any gripping activities and night time                 OT Education - 06/12/16 1802    Education provided Yes   Education Details splint wearing    Person(s) Educated  Patient   Methods Explanation;Demonstration;Tactile cues;Verbal cues   Comprehension Verbal cues required;Returned demonstration;Verbalized understanding          OT Short Term Goals - 06/05/16 2133    OT SHORT TERM GOAL #1   Title Pain on PRWHE improve by at least 20 points    Baseline Pain on PRHWE at eval 44/50   Time 3   Period Weeks   Status New           OT Long Term Goals - 06/05/16 2134    OT LONG TERM GOAL #1   Title Grip and prehension  strength in R hand improve by at least 3-5 lbs to use hand with less pain    Baseline grip 65 R, L 95 ; lat and 3 point grip R 18 ; L 26 lbs    Time 4   Period Weeks   Status New   OT LONG TERM GOAL #2   Title Function score on PRWHE improve with at least 15 points    Baseline PRWHE for function at eval 24.5/50  Time 4   Period Weeks   Status New               Plan - 06/12/16 1803    Clinical Impression Statement Pt cont to have pain with composite fist - 5/10 that is better than last  week - but fabricated MP block splint to wear during gripping to decrease flexion at MP - to prevent cont irrication of radial sensory branch    Rehab Potential Good   OT Frequency 1x / week   OT Duration 4 weeks   OT Treatment/Interventions Self-care/ADL training;Contrast Bath;Fluidtherapy;Ultrasound;Therapeutic exercise;Patient/family education;Splinting;Manual Therapy   Plan assess splint use and if pain improve    OT Home Exercise Plan see pt instruction    Consulted and Agree with Plan of Care Patient      Patient will benefit from skilled therapeutic intervention in order to improve the following deficits and impairments:  Impaired sensation, Increased edema, Impaired UE functional use, Pain, Decreased strength  Visit Diagnosis: Pain in right hand  Other disturbances of skin sensation  Muscle weakness (generalized)    Problem List Patient Active Problem List   Diagnosis Date Noted  . Right hand pain 03/11/2016  .  IBS (irritable bowel syndrome) 12/30/2015  . Tinea of nail 11/13/2015  . Rash and nonspecific skin eruption 04/20/2015  . Shingles outbreak 02/24/2014  . Thrombocytopenia (HCC) 02/14/2014  . Bordetella pertussis 12/13/2013  . Leukopenia 12/13/2013  . Dysautonomia/POTS 09/06/2013  . Allergic rhinitis 03/25/2013  . Constipation - functional 10/14/2012  . Gilbert's disease 09/22/2012  . Personal history of gastric ulcer 03/01/2012    Oletta CohnuPreez, Sabel Hornbeck OTR/L,CLT  06/12/2016, 6:05 PM  [facility] [address] [place], [place], [address] Phone: [phone]   Fax:  [phone]  Name: Christian Montgomery MRN: [id number] Date of Birth: Nov 03, 1991

## 2016-06-19 ENCOUNTER — Ambulatory Visit: Payer: 59 | Admitting: Occupational Therapy

## 2016-06-19 DIAGNOSIS — M79641 Pain in right hand: Secondary | ICD-10-CM

## 2016-06-19 DIAGNOSIS — M6281 Muscle weakness (generalized): Secondary | ICD-10-CM

## 2016-06-19 DIAGNOSIS — R208 Other disturbances of skin sensation: Secondary | ICD-10-CM

## 2016-06-19 NOTE — Therapy (Signed)
[facility] [address] [place], [address] Phone: [phone]   Fax:  [phone]  Occupational Therapy Treatment  Patient Details  Name: Christian Montgomery MRN: [id number] Date of Birth: 1991/06/29 Referring Provider: Dedra Skeens  Encounter Date: 06/19/2016      OT End of Session - 06/19/16 1116    Visit Number 3   Number of Visits 4   Date for OT Re-Evaluation 07/03/16   OT Start Time 1032   OT Stop Time 1059   OT Time Calculation (min) 27 min   Activity Tolerance Patient tolerated treatment well   Behavior During Therapy [facility] for tasks assessed/performed      Past Medical History  Diagnosis Date  . Gastric ulcer with obstruction April 2013    hosp [facility]    Past Surgical History  Procedure Laterality Date  . Cosmetic surgery      There were no vitals filed for this visit.      Subjective Assessment - 06/19/16 1109    Subjective  I am wearing the splint - but it comes loose - put on at night time the glove over it - to pain still there at the end of the day after using it - but not as intense - now more 5 than 7/10    Patient Stated Goals Want the pain and numbness in my finger better - like it was before   Currently in Pain? Yes   Pain Score 5    Pain Location Finger (Comment which one)   Pain Orientation Right   Pain Type Neuropathic pain   Pain Onset More than a month ago            [facility] OT Assessment - 06/19/16 0001    Strength   Right Hand Grip (lbs) 81   Right Hand Lateral Pinch 19 lbs   Right Hand 3 Point Pinch 22 lbs   Left Hand Grip (lbs) 95   Left Hand Lateral Pinch 26 lbs   Left Hand 3 Point Pinch 26 lbs   Right Hand AROM   R Index  MCP 0-90 90 Degrees   R Index PIP 0-100 100 Degrees                  OT Treatments/Exercises (OP) - 06/19/16 0001    RUE Contrast Bath   Time 7 minutes   Comments at end after pain increase during assessing grip  and prehension - ROM       Assess AROM and grip/prehension - see flowsheet  AROM WNL - grip and 3 point increase   Tinel is just proximal to 2nd [facility] - before it was more over dorsal hand  Pain intensity decrease by end of day - but still up at about 5/10   Pt  Provided with some velcro to make strap to keep MC block splint on 2nd in place  Pt to cont to wear splint during activity to prevent composite fist at work and sleeping  Can do heat only - not contrast if he feels like pain increase with ice  Explain about  Nerve healing for that radial sensory branc           OT Education - 06/19/16 1113    Education provided Yes   Education Details HEP    Person(s) Educated Patient   Methods Explanation;Demonstration;Tactile cues;Verbal cues   Comprehension Verbal cues required;Returned demonstration;Verbalized understanding  OT Short Term Goals - 06/19/16 1119    OT SHORT TERM GOAL #1   Title Pain on PRWHE improve by at least 20 points    Baseline Pain on PRHWE at eval 44/50 but intensity improve by end of day    Time 3   Period Weeks   Status On-going           OT Long Term Goals - 06/19/16 1119    OT LONG TERM GOAL #1   Title Grip and prehension  strength in R hand improve by at least 3-5 lbs to use hand with less pain    Baseline Grip improve to 81 , was 65 ; 3 point was 18 improve to 22 lbs    Time 3   Period Weeks   Status On-going   OT LONG TERM GOAL #2   Title Function score on PRWHE improve with at least 15 points    Baseline PRWHE for function at eval 24.5/50- NT this date   Time 3   Period Weeks   Status On-going               Plan - 06/19/16 1117    Clinical Impression Statement Pt cont to have pain more at end of day or after using finger - pt  report complaince with splint -grip and 3 point grip improved - Tinel improving -  provided some straps to keep splint in place - to explain nerve healing - pt to cont with homeprogram for 2 wks  and  return 7th of Aug - if no issues can phone and cx   Rehab Potential Good   OT Frequency 1x / week   OT Duration 4 weeks   OT Treatment/Interventions Self-care/ADL training;Contrast Bath;Fluidtherapy;Ultrasound;Therapeutic exercise;Patient/family education;Splinting;Manual Therapy   Plan pt to return in 2 wks - assess progress - phone if improve and want to cx   OT Home Exercise Plan see pt instruction    Consulted and Agree with Plan of Care Patient      Patient will benefit from skilled therapeutic intervention in order to improve the following deficits and impairments:  Impaired sensation, Increased edema, Impaired UE functional use, Pain, Decreased strength  Visit Diagnosis: Pain in right hand  Other disturbances of skin sensation  Muscle weakness (generalized)    Problem List Patient Active Problem List   Diagnosis Date Noted  . Right hand pain 03/11/2016  . IBS (irritable bowel syndrome) 12/30/2015  . Tinea of nail 11/13/2015  . Rash and nonspecific skin eruption 04/20/2015  . Shingles outbreak 02/24/2014  . Thrombocytopenia (HCC) 02/14/2014  . Bordetella pertussis 12/13/2013  . Leukopenia 12/13/2013  . Dysautonomia/POTS 09/06/2013  . Allergic rhinitis 03/25/2013  . Constipation - functional 10/14/2012  . Gilbert's disease 09/22/2012  . Personal history of gastric ulcer 03/01/2012    Oletta CohnuPreez, Gregory Barrick OTR/L,CLT  06/19/2016, 11:21 AM  [facility] [address] [place], [place], [id number] Phone: [phone]   Fax:  [phone]  Name: Christian Montgomery MRN: [id number] Date of Birth: Sep 08, 1991

## 2016-06-19 NOTE — Patient Instructions (Addendum)
    Pt  Provided with some velcro to make strap to keep MC block splint on 2nd in place  Pt to cont to wear splint during activity to prevent composite fist at work and sleeping  Can do heat only - not contrast if he feels like pain increase with ice  Explain about  Nerve healing for that radial sensory branch

## 2016-06-20 ENCOUNTER — Ambulatory Visit: Payer: 59 | Admitting: Occupational Therapy

## 2016-07-07 ENCOUNTER — Encounter: Payer: 59 | Admitting: Occupational Therapy

## 2016-10-08 ENCOUNTER — Other Ambulatory Visit: Payer: Self-pay | Admitting: Internal Medicine

## 2016-11-10 ENCOUNTER — Encounter: Payer: Self-pay | Admitting: Internal Medicine

## 2016-11-14 ENCOUNTER — Ambulatory Visit (INDEPENDENT_AMBULATORY_CARE_PROVIDER_SITE_OTHER): Payer: 59 | Admitting: Family Medicine

## 2016-11-14 ENCOUNTER — Encounter: Payer: Self-pay | Admitting: Family Medicine

## 2016-11-14 DIAGNOSIS — R1030 Lower abdominal pain, unspecified: Secondary | ICD-10-CM | POA: Diagnosis not present

## 2016-11-14 LAB — CBC
HEMATOCRIT: 44.8 % (ref 39.0–52.0)
HEMOGLOBIN: 15.4 g/dL (ref 13.0–17.0)
MCHC: 34.3 g/dL (ref 30.0–36.0)
MCV: 89.9 fl (ref 78.0–100.0)
Platelets: 184 10*3/uL (ref 150.0–400.0)
RBC: 4.98 Mil/uL (ref 4.22–5.81)
RDW: 12.5 % (ref 11.5–15.5)
WBC: 4.1 10*3/uL (ref 4.0–10.5)

## 2016-11-14 LAB — COMPREHENSIVE METABOLIC PANEL
ALBUMIN: 4.9 g/dL (ref 3.5–5.2)
ALK PHOS: 49 U/L (ref 39–117)
ALT: 13 U/L (ref 0–53)
AST: 13 U/L (ref 0–37)
BUN: 10 mg/dL (ref 6–23)
CALCIUM: 9.5 mg/dL (ref 8.4–10.5)
CO2: 32 mEq/L (ref 19–32)
Chloride: 102 mEq/L (ref 96–112)
Creatinine, Ser: 0.94 mg/dL (ref 0.40–1.50)
GFR: 101.62 mL/min (ref >60.00)
Glucose, Bld: 79 mg/dL (ref 70–99)
POTASSIUM: 4.2 meq/L (ref 3.5–5.1)
Sodium: 141 mEq/L (ref 135–145)
TOTAL PROTEIN: 7 g/dL (ref 6.0–8.3)
Total Bilirubin: 2.3 mg/dL — ABNORMAL HIGH (ref 0.2–1.2)

## 2016-11-14 LAB — SEDIMENTATION RATE: SED RATE: 1 mm/h (ref 0–15)

## 2016-11-14 NOTE — Assessment & Plan Note (Signed)
New acute problem. Patient states that he's concerned about diverticulitis. This is unlikely given his age as well as his presentation. Likely exacerbation of IBS. Highly doubt diverticulitis. Obtaining labs for further evaluation (if no elevation of WBC count or inflammatory markers, highly unlikely to be diverticulitis). Continue Bentyl. Supportive care.

## 2016-11-14 NOTE — Patient Instructions (Signed)
This is likely from your IBS.  Labs today.  Continue the bentyl.  Take care  Dr. Adriana Simasook

## 2016-11-14 NOTE — Progress Notes (Signed)
Pre visit review using our clinic review tool, if applicable. No additional management support is needed unless otherwise documented below in the visit note. 

## 2016-11-14 NOTE — Progress Notes (Signed)
   Subjective:  Patient ID: Christian Montgomery, male    DOB: 1991/04/14  Age: 25 y.o. MRN: [id number]  CC: Abdominal cramping/pain  HPI:  25 year old male with a history of IBS and gastric ulcer presents with the above complaints.  Patient states that last week he had severe abdominal cramping/lower abdominal pain (bilateral). Slowly improved over a few hours. No reports of hematochezia or melena. He's had some smaller cramps and mild pain since that period of time. He has ongoing issues with IBS. No known exacerbating or relieving factors. No associated fever or chills. No other associated symptoms. No other complaints this time.  Social Hx   Social History   Social History  . Marital status: Single    Spouse name: N/A  . Number of children: N/A  . Years of education: N/A   Social History Main Topics  . Smoking status: Never Smoker  . Smokeless tobacco: Never Used  . Alcohol use No  . Drug use: No  . Sexual activity: No   Other Topics Concern  . None   Social History Narrative  . None   Review of Systems  Constitutional: Negative for fever.  Gastrointestinal: Positive for abdominal pain. Negative for blood in stool.   Objective:  BP 118/73 (BP Location: Left Arm, Patient Position: Sitting, Cuff Size: Normal)   Pulse 80   Temp 97.6 F (36.4 C) (Oral)   Resp 12   Wt 152 lb 4 oz (69.1 kg)   SpO2 98%   BMI 20.53 kg/m   BP/Weight 11/14/2016 03/11/2016 2/44/6286  Systolic BP 381 771 165  Diastolic BP 73 72 78  Wt. (Lbs) 152.25 155.8 154  BMI 20.53 21.01 19.76   Physical Exam  Constitutional: He is oriented to person, place, and time. He appears well-developed. No distress.  Cardiovascular: Normal rate and regular rhythm.   Pulmonary/Chest: Effort normal and breath sounds normal.  Abdominal: Soft. He exhibits no distension. There is no tenderness. There is no rebound and no guarding.  Neurological: He is alert and oriented to person, place, and time.  Psychiatric: He  has a normal mood and affect.  Vitals reviewed.   Lab Results  Component Value Date   WBC 4.4 06/15/2014   HGB 13.6 06/15/2014   HCT 40.9 06/15/2014   PLT 156 06/15/2014   GLUCOSE 79 11/12/2015   CHOL 118 01/12/2014   TRIG 29.0 01/12/2014   HDL 46.40 01/12/2014   LDLCALC 66 01/12/2014   ALT 19 12/28/2015   AST 19 12/28/2015   NA 142 11/12/2015   K 3.9 11/12/2015   CL 102 11/12/2015   CREATININE 1.02 11/12/2015   BUN 11 11/12/2015   CO2 32 11/12/2015   INR 1.3 (H) 02/24/2014   HGBA1C 5.4 05/27/2013    Assessment & Plan:   Problem List Items Addressed This Visit    Lower abdominal pain    New acute problem. Patient states that he's concerned about diverticulitis. This is unlikely given his age as well as his presentation. Likely exacerbation of IBS. Highly doubt diverticulitis. Obtaining labs for further evaluation (if no elevation of WBC count or inflammatory markers, highly unlikely to be diverticulitis). Continue Bentyl. Supportive care.      Relevant Orders   CBC   Comp Met (CMET)   Sed Rate (ESR)     Follow-up: PRN  [place]

## 2017-04-13 ENCOUNTER — Encounter: Payer: Self-pay | Admitting: Family Medicine

## 2017-04-13 ENCOUNTER — Ambulatory Visit (INDEPENDENT_AMBULATORY_CARE_PROVIDER_SITE_OTHER): Payer: 59 | Admitting: Family Medicine

## 2017-04-13 DIAGNOSIS — R059 Cough, unspecified: Secondary | ICD-10-CM | POA: Insufficient documentation

## 2017-04-13 DIAGNOSIS — R05 Cough: Secondary | ICD-10-CM

## 2017-04-13 MED ORDER — PREDNISONE 50 MG PO TABS: ORAL_TABLET | ORAL | 0 refills | Status: DC

## 2017-04-13 MED ORDER — HYDROCOD POLST-CPM POLST ER 10-8 MG/5ML PO SUER: 5.0000 mL | Freq: Two times a day (BID) | ORAL | 0 refills | Status: DC | PRN

## 2017-04-13 NOTE — Progress Notes (Signed)
   Subjective:  Patient ID: Christian Montgomery, male    DOB: 08/26/1991  Age: 26 y.o. MRN: [id number]  CC: Cough  HPI:  26 year old male presents with the above complaint.  Patient reports that 5 weeks ago he had a URI. His symptoms improved. However, shortly after he developed a dry cough. Patient states that he continues to be bothered by cough. Nonproductive. Worse in the morning and in the evening. Moderate in severity. No associated fevers or chills. No shortness of breath. No known exacerbating or relieving factors. No other associated symptoms. No other complaints at this time.  Social Hx   Social History   Social History  . Marital status: Single    Spouse name: N/A  . Number of children: N/A  . Years of education: N/A   Social History Main Topics  . Smoking status: Never Smoker  . Smokeless tobacco: Never Used  . Alcohol use No  . Drug use: No  . Sexual activity: No   Other Topics Concern  . None   Social History Narrative  . None    Review of Systems  Constitutional: Negative.   Respiratory: Positive for cough.    Objective:  BP 116/86   Pulse 77   Temp 97.7 F (36.5 C) (Oral)   Wt 152 lb 4 oz (69.1 kg)   SpO2 98%   BMI 20.53 kg/m   BP/Weight 04/13/2017 11/14/2016 03/11/2016  Systolic BP 116 118 106  Diastolic BP 86 73 72  Wt. (Lbs) 152.25 152.25 155.8  BMI 20.53 20.53 21.01   Physical Exam  Constitutional: He is oriented to person, place, and time. He appears well-developed. No distress.  HENT:  Mouth/Throat: Oropharynx is clear and moist.  Cardiovascular: Normal rate and regular rhythm.   Pulmonary/Chest: Effort normal and breath sounds normal.  Neurological: He is alert and oriented to person, place, and time.  Psychiatric: He has a normal mood and affect.  Vitals reviewed.   Lab Results  Component Value Date   WBC 4.1 11/14/2016   HGB 15.4 11/14/2016   HCT 44.8 11/14/2016   PLT 184.0 11/14/2016   GLUCOSE 79 11/14/2016   CHOL 118 01/12/2014    TRIG 29.0 01/12/2014   HDL 46.40 01/12/2014   LDLCALC 66 01/12/2014   ALT 13 11/14/2016   AST 13 11/14/2016   NA 141 11/14/2016   K 4.2 11/14/2016   CL 102 11/14/2016   CREATININE 0.94 11/14/2016   BUN 10 11/14/2016   CO2 32 11/14/2016   INR 1.3 (H) 02/24/2014   HGBA1C 5.4 05/27/2013    Assessment & Plan:   Problem List Items Addressed This Visit    Cough    New problem. Post viral. Treating with prednisone and tussionex.         Meds ordered this encounter  Medications  . predniSONE (DELTASONE) 50 MG tablet    Sig: 1 tablet daily x 5 days.    Dispense:  5 tablet    Refill:  0  . chlorpheniramine-HYDROcodone (TUSSIONEX PENNKINETIC ER) 10-8 MG/5ML SUER    Sig: Take 5 mLs by mouth every 12 (twelve) hours as needed.    Dispense:  115 mL    Refill:  0   Follow-up: PRN  Everlene OtherJayce Henessy Rohrer DO [facility]

## 2017-04-13 NOTE — Patient Instructions (Signed)
Medications as prescribed. ° °Take care ° °Dr. Elsa Ploch  °

## 2017-04-13 NOTE — Assessment & Plan Note (Signed)
New problem. Post viral. Treating with prednisone and tussionex.

## 2017-08-26 NOTE — Telephone Encounter (Signed)
Orders

## 2017-12-23 ENCOUNTER — Encounter: Payer: Self-pay | Admitting: Internal Medicine

## 2017-12-23 ENCOUNTER — Ambulatory Visit (INDEPENDENT_AMBULATORY_CARE_PROVIDER_SITE_OTHER): Payer: 59

## 2017-12-23 ENCOUNTER — Ambulatory Visit (INDEPENDENT_AMBULATORY_CARE_PROVIDER_SITE_OTHER): Payer: 59 | Admitting: Internal Medicine

## 2017-12-23 VITALS — BP 112/64 | HR 86 | Temp 98.0°F | Resp 14 | Ht 72.2 in | Wt 157.8 lb

## 2017-12-23 DIAGNOSIS — M5412 Radiculopathy, cervical region: Secondary | ICD-10-CM

## 2017-12-23 DIAGNOSIS — M79621 Pain in right upper arm: Secondary | ICD-10-CM | POA: Diagnosis not present

## 2017-12-23 DIAGNOSIS — S46211A Strain of muscle, fascia and tendon of other parts of biceps, right arm, initial encounter: Secondary | ICD-10-CM | POA: Diagnosis not present

## 2017-12-23 DIAGNOSIS — S161XXA Strain of muscle, fascia and tendon at neck level, initial encounter: Secondary | ICD-10-CM

## 2017-12-23 DIAGNOSIS — M542 Cervicalgia: Secondary | ICD-10-CM | POA: Diagnosis not present

## 2017-12-23 DIAGNOSIS — M25511 Pain in right shoulder: Secondary | ICD-10-CM | POA: Diagnosis not present

## 2017-12-23 DIAGNOSIS — M25521 Pain in right elbow: Secondary | ICD-10-CM | POA: Diagnosis not present

## 2017-12-23 MED ORDER — PREDNISONE 10 MG PO TABS: ORAL_TABLET | ORAL | 0 refills | Status: DC

## 2017-12-23 MED ORDER — OMEPRAZOLE 40 MG PO CPDR: 40.0000 mg | DELAYED_RELEASE_CAPSULE | Freq: Every day | ORAL | 0 refills | Status: DC

## 2017-12-23 NOTE — Progress Notes (Signed)
Subjective:  Patient ID: Christian Montgomery, male    DOB: July 11, 1991  Age: 27 y.o. MRN: [id number]  CC: The primary encounter diagnosis was Upper arm joint pain, right. Diagnoses of Pain of right upper arm, Radiculopathy of cervical region, Biceps strain, right, initial encounter, and Strain of sternocleidomastoid muscle, initial encounter were also pertinent to this visit.  HPI Christian Montgomery presents for evaluation of right arm pain   Has been Present for several months,  Aggravated by Lifting heavy boxes at , but also by sleeping on right side,   hurts from the elbow to the deltoid and triceps region,  Shoulder joint is spared.   also having neck pain on same side that shoots from just below  the right ear straight down the anterior chest wall. Occurs with flexion and extension of neck,  Has been troublesome  for the same amount of time   Has not taken any nsaids for the pain nor  Has he tried tylenol .  Has history of gastric ulcer and told to avoid nsaids.  No longer taking a PPI   Outpatient Medications Prior to Visit  Medication Sig Dispense Refill  . chlorpheniramine-HYDROcodone (TUSSIONEX PENNKINETIC ER) 10-8 MG/5ML SUER Take 5 mLs by mouth every 12 (twelve) hours as needed. (Patient not taking: Reported on 12/23/2017) 115 mL 0  . dicyclomine (BENTYL) 10 MG capsule TAKE ONE (1) CAPSULE BY MOUTH 4 TIMES DAILY BEFORE MEALS AND AT BEDTIME (Patient not taking: Reported on 12/23/2017) 120 capsule 2  . omeprazole (PRILOSEC) 40 MG capsule TAKE ONE CAPSULE BY MOUTH DAILY (Patient not taking: Reported on 12/23/2017) 30 capsule 3  . pantoprazole (PROTONIX) 40 MG tablet Take 1 tablet (40 mg total) by mouth daily. (Patient not taking: Reported on 12/23/2017) 90 tablet 3  . predniSONE (DELTASONE) 50 MG tablet 1 tablet daily x 5 days. 5 tablet 0  . terbinafine (LAMISIL) 250 MG tablet TAKE ONE (1) TABLET EACH DAY FOR SIX WEEKS (Patient not taking: Reported on 12/23/2017) 42 tablet 3  . triamcinolone cream  (KENALOG) 0.1 % Apply 1 application topically 2 (two) times daily. 30 g 0   No facility-administered medications prior to visit.     Review of Systems;  Patient denies headache, fevers, malaise, unintentional weight loss, skin rash, eye pain, sinus congestion and sinus pain, sore throat, dysphagia,  hemoptysis , cough, dyspnea, wheezing, chest pain, palpitations, orthopnea, edema, abdominal pain, nausea, melena, diarrhea, constipation, flank pain, dysuria, hematuria, urinary  Frequency, nocturia, numbness, tingling, seizures,  Focal weakness, Loss of consciousness,  Tremor, insomnia, depression, anxiety, and suicidal ideation.      Objective:  BP 112/64 (BP Location: Left Arm, Patient Position: Sitting, Cuff Size: Normal)   Pulse 86   Temp 98 F (36.7 C) (Oral)   Resp 14   Ht 6' 0.2" (1.834 m)   Wt 157 lb 12.8 oz (71.6 kg)   SpO2 99%   BMI 21.28 kg/m   BP Readings from Last 3 Encounters:  12/23/17 112/64  04/13/17 116/86  11/14/16 118/73    Wt Readings from Last 3 Encounters:  12/23/17 157 lb 12.8 oz (71.6 kg)  04/13/17 152 lb 4 oz (69.1 kg)  11/14/16 152 lb 4 oz (69.1 kg)    General appearance: alert, cooperative and appears stated age Ears: normal TM's and external ear canals both ears Throat: lips, mucosa, and tongue normal; teeth and gums normal Neck: no adenopathy, no carotid bruit, supple, symmetrical, trachea midline and thyroid not enlarged, symmetric,  no tenderness/mass/nodules Back: symmetric, no curvature. ROM normal. No CVA tenderness. Lungs: clear to auscultation bilaterally Heart: regular rate and rhythm, S1, S2 normal, no murmur, click, rub or gallop Abdomen: soft, non-tender; bowel sounds normal; no masses,  no organomegaly Pulses: 2+ and symmetric Skin: Skin color, texture, turgor normal. No rashes or lesions MSK:  No bony tenderness on right shoulder or right clavicle.   ROM restricted.  Does not hurt with arm remaining close to side but abduction past  45 degrees, and adduction across chest wall  reproduces the pain.   Lymph nodes: Cervical, supraclavicular, and axillary nodes normal.  Lab Results  Component Value Date   HGBA1C 5.4 05/27/2013   HGBA1C 5.4 09/21/2012    Lab Results  Component Value Date   CREATININE 0.94 11/14/2016   CREATININE 1.02 11/12/2015   CREATININE 0.8 01/12/2014    Lab Results  Component Value Date   WBC 4.1 11/14/2016   HGB 15.4 11/14/2016   HCT 44.8 11/14/2016   PLT 184.0 11/14/2016   GLUCOSE 79 11/14/2016   CHOL 118 01/12/2014   TRIG 29.0 01/12/2014   HDL 46.40 01/12/2014   LDLCALC 66 01/12/2014   ALT 13 11/14/2016   AST 13 11/14/2016   NA 141 11/14/2016   K 4.2 11/14/2016   CL 102 11/14/2016   CREATININE 0.94 11/14/2016   BUN 10 11/14/2016   CO2 32 11/14/2016   INR 1.3 (H) 02/24/2014   HGBA1C 5.4 05/27/2013    Dg Hand Complete Right  Result Date: 03/11/2016 CLINICAL DATA:  Injury.  Pain and swelling. EXAM: RIGHT HAND - COMPLETE 3+ VIEW COMPARISON:  No recent prior. FINDINGS: No acute bony or joint abnormality. IMPRESSION: No acute abnormality. Electronically Signed   By: Maisie Fus  Register   On: 03/11/2016 09:48    Assessment & Plan:   Problem List Items Addressed This Visit    Biceps strain, right, initial encounter    Suggested by exam .  Plain films of shoulder normal.  prednisonee taper, PPI,  andtylenol,  Refrain from lifting anything > 5 lbs for two weeks.  Letter written.  Reassess 1 month      Strain of sternocleidomastoid muscle    Suggested by exam.  Cervical spine films are without degenerative changes.  Treatment same as above,  Avoid heavy lifting.        Other Visit Diagnoses    Upper arm joint pain, right    -  Primary   Pain of right upper arm       Relevant Orders   DG Shoulder Right (Completed)   Radiculopathy of cervical region       Relevant Orders   DG Cervical Spine Complete (Completed)      I have discontinued Dorene Sorrow L. Snellgrove's triamcinolone cream,  pantoprazole, terbinafine, dicyclomine, omeprazole, predniSONE, and chlorpheniramine-HYDROcodone. I am also having him start on predniSONE and omeprazole.  Meds ordered this encounter  Medications  . predniSONE (DELTASONE) 10 MG tablet    Sig: 6 tablets on Day 1 , then reduce by 1 tablet daily until gone    Dispense:  21 tablet    Refill:  0  . omeprazole (PRILOSEC) 40 MG capsule    Sig: Take 1 capsule (40 mg total) by mouth daily.    Dispense:  30 capsule    Refill:  0    Medications Discontinued During This Encounter  Medication Reason  . chlorpheniramine-HYDROcodone (TUSSIONEX PENNKINETIC ER) 10-8 MG/5ML SUER Completed Course  . dicyclomine (BENTYL) 10 MG  capsule Patient has not taken in last 30 days  . omeprazole (PRILOSEC) 40 MG capsule Patient has not taken in last 30 days  . pantoprazole (PROTONIX) 40 MG tablet Patient has not taken in last 30 days  . predniSONE (DELTASONE) 50 MG tablet Patient has not taken in last 30 days  . terbinafine (LAMISIL) 250 MG tablet Patient has not taken in last 30 days  . triamcinolone cream (KENALOG) 0.1 % Patient has not taken in last 30 days    Follow-up: Return in about 4 weeks (around 01/20/2018), or arm pai n.   Sherlene Shams, MD

## 2017-12-23 NOTE — Patient Instructions (Addendum)
You have tendonitis of the biceps tendon , plus you may have arthritis in your neck and shoulder  I am prescribing Prednisone in a  Tapering dose for 6 days starting at 60 mg on day 1.    Decrease your dose by 10 mg every day until gone   Add tylenol up to 2000 mg daily  (1000 mg twice daily  Or 625 mg 3 times daily ) for pain relief.     xrays of   cervical spine and right shoulder today   I also want you to take omeprazole for the next 2 weeks  Up to 4 weeks to protect your stomach

## 2017-12-25 ENCOUNTER — Encounter: Payer: Self-pay | Admitting: Internal Medicine

## 2017-12-25 DIAGNOSIS — S161XXA Strain of muscle, fascia and tendon at neck level, initial encounter: Secondary | ICD-10-CM | POA: Insufficient documentation

## 2017-12-25 DIAGNOSIS — S46211A Strain of muscle, fascia and tendon of other parts of biceps, right arm, initial encounter: Secondary | ICD-10-CM | POA: Insufficient documentation

## 2017-12-25 NOTE — Assessment & Plan Note (Addendum)
Suggested by exam .  Plain films of shoulder normal.  prednisonee taper, PPI,  andtylenol,  Refrain from lifting anything > 5 lbs for two weeks.  Letter written.  Reassess 1 month

## 2017-12-25 NOTE — Assessment & Plan Note (Signed)
Suggested by exam.  Cervical spine films are without degenerative changes.  Treatment same as above,  Avoid heavy lifting.

## 2018-01-06 NOTE — Progress Notes (Signed)
Pt has been notified of lab results and stated that he would come by and pick up the letter for work. Pt gave a verbal understanding to results and recommendations from Dr. Darrick Huntsmanullo.

## 2018-04-05 ENCOUNTER — Ambulatory Visit: Payer: Self-pay

## 2018-04-05 ENCOUNTER — Encounter: Payer: Self-pay | Admitting: Family

## 2018-04-05 ENCOUNTER — Ambulatory Visit (INDEPENDENT_AMBULATORY_CARE_PROVIDER_SITE_OTHER): Payer: 59 | Admitting: Family

## 2018-04-05 VITALS — BP 122/80 | HR 78 | Temp 98.3°F | Resp 16 | Wt 155.2 lb

## 2018-04-05 DIAGNOSIS — R0789 Other chest pain: Secondary | ICD-10-CM | POA: Diagnosis not present

## 2018-04-05 MED ORDER — DICLOFENAC SODIUM 1 % TD GEL: 4.0000 g | Freq: Four times a day (QID) | TRANSDERMAL | 3 refills | Status: DC

## 2018-04-05 NOTE — Telephone Encounter (Signed)
fyi

## 2018-04-05 NOTE — Progress Notes (Signed)
Subjective:    Patient ID: Christian Montgomery, male    DOB: 1991-07-20, 27 y.o.   MRN: [id number]  CC: Christian Montgomery is a 27 y.o. male who presents today for an acute visit.    HPI: [id number] side pain over rib cage, 5 days, unchanged.   hasnt tried any medication. No pain when sitting straight up. Feels like stabbing pain when bends or twist. Pain when lying down on right side.  Twisting and bending cause the worse pain.  Notes prior to , 2 weeks of coughing, which has improved. Had HA, sore throat at that time- resolved now.  No longer having producton with cough, coughs more rarely now. Had fever two weeks ago 100 was tmax. No  N, v, ear pain, urinary burning.   No injury.  No h/o renal stone  H/o gastric ulcer   HISTORY:  Past Medical History:  Diagnosis Date  . Gastric ulcer with obstruction April 2013   hosp [facility]   Past Surgical History:  Procedure Laterality Date  . COSMETIC SURGERY     Family History  Problem Relation Age of Onset  . COPD Father   . Alcohol abuse Father   . Heart disease Father   . Diabetes Sister 77       gestational, not overweight   . Diabetes Maternal Grandmother   . Cancer Maternal Grandmother        endometrial  . Early death Maternal Grandmother        cause unknown  . Heart failure Paternal Grandmother     Allergies: Patient has no known allergies. Current Outpatient Medications on File Prior to Visit  Medication Sig Dispense Refill  . omeprazole (PRILOSEC) 40 MG capsule Take 1 capsule (40 mg total) by mouth daily. (Patient not taking: Reported on 04/05/2018) 30 capsule 0  . predniSONE (DELTASONE) 10 MG tablet 6 tablets on Day 1 , then reduce by 1 tablet daily until gone (Patient not taking: Reported on 04/05/2018) 21 tablet 0   No current facility-administered medications on file prior to visit.     Social History   Tobacco Use  . Smoking status: Never Smoker  . Smokeless tobacco: Never Used  Substance Use Topics  .  Alcohol use: No  . Drug use: No    Review of Systems  Constitutional: Negative for chills and fever.  HENT: Negative for congestion (resolved).   Respiratory: Positive for cough. Negative for shortness of breath and wheezing.   Cardiovascular: Negative for chest pain and palpitations.  Gastrointestinal: Negative for abdominal pain, nausea and vomiting.  Genitourinary: Positive for flank pain. Negative for difficulty urinating and dysuria.  Musculoskeletal: Negative for back pain.  Skin: Negative for rash.  Neurological: Negative for numbness.      Objective:    BP 122/80 (BP Location: Left Arm, Patient Position: Sitting, Cuff Size: Normal)   Pulse 78   Temp 98.3 F (36.8 C)   Resp 16   Wt 155 lb 4 oz (70.4 kg)   SpO2 98%   BMI 20.94 kg/m    Physical Exam  Constitutional: He appears well-developed and well-nourished.  Cardiovascular: Regular rhythm and normal heart sounds.  Pulmonary/Chest: Effort normal and breath sounds normal. No respiratory distress. He has no wheezes. He has no rhonchi. He has no rales. He exhibits tenderness. He exhibits no mass, no laceration, no edema, no deformity, no swelling and no retraction.  No pain with deep inspiration.  Focal area of tenderness  is along the right rib wall.  No ecchymosis, swelling.  No increased warmth.  No rash.    Lymphadenopathy:       Head (left side): No submandibular and no preauricular adenopathy present.  Neurological: He is alert.  Skin: Skin is warm and dry.  Psychiatric: He has a normal mood and affect. His speech is normal and behavior is normal.  Vitals reviewed.      Assessment & Plan:   1. Right-sided chest wall pain Patient is well-appearing, no acute respiratory distress.  Etiology of right-sided chest wall pain not specific at this time although I suspect that coughing 2 weeks prior is contributory.  On exam there is a focal point tenderness.  No ecchymosis, swelling appreciated.  Suspect muscular  strain.  Patient has not tried any conservative therapy at home, and we jointly agreed trial of a topical NSAID, heat/ice as appropriate next step.  If no improvement, patient will let me know. - diclofenac sodium (VOLTAREN) 1 % GEL; Apply 4 g topically 4 (four) times daily.  Dispense: 1 Tube; Refill: 3    I am having Christian [facility] maintain his predniSONE and omeprazole.   No orders of the defined types were placed in this encounter.   Return precautions given.   Risks, benefits, and alternatives of the medications and treatment plan prescribed today were discussed, and patient expressed understanding.   Education regarding symptom management and diagnosis given to patient on AVS.  Continue to follow with Christian Shams, MD for routine health maintenance.   [name] and I agreed with plan.   Christian Plowman, FNP

## 2018-04-05 NOTE — Patient Instructions (Signed)
Suspect muscular strain  Trial of voltaren gel Heat or ice for at least 20 minutes 2-3 times per day Monitor for ANY worsening symptoms If no improvement in a couple of days, please let me know

## 2018-04-05 NOTE — Telephone Encounter (Signed)
FYI... Pt is scheduled to see Claris Che at 2:30pm today.

## 2018-04-05 NOTE — Telephone Encounter (Signed)
Pt. Reports started having abdominal pain Thursday night. Right sided, below ribs, across from his belly button.No fever, nausea, vomiting or diarrhea. States he had "a bad cough, and it started hurting then." Hurts with movement - has to lay a certain way in bed for relief. Appointment made for today.  Reason for Disposition . [1] MODERATE pain (e.g., interferes with normal activities) AND [2] pain comes and goes (cramps) AND [3] present > 24 hours  (Exception: pain with Vomiting or Diarrhea - see that Guideline)  Answer Assessment - Initial Assessment Questions 1. LOCATION: "Where does it hurt?"      Right side across from belly button 2. RADIATION: "Does the pain shoot anywhere else?" (e.g., chest, back)     No  3. ONSET: "When did the pain begin?" (Minutes, hours or days ago)      Started Thursday and getting worse 4. SUDDEN: "Gradual or sudden onset?"     Gradual 5. PATTERN "Does the pain come and go, or is it constant?"    - If constant: "Is it getting better, staying the same, or worsening?"      (Note: Constant means the pain never goes away completely; most serious pain is constant and it progresses)     - If intermittent: "How long does it last?" "Do you have pain now?"     (Note: Intermittent means the pain goes away completely between bouts)     Constant now 6. SEVERITY: "How bad is the pain?"  (e.g., Scale 1-10; mild, moderate, or severe)    - MILD (1-3): doesn't interfere with normal activities, abdomen soft and not tender to touch     - MODERATE (4-7): interferes with normal activities or awakens from sleep, tender to touch     - SEVERE (8-10): excruciating pain, doubled over, unable to do any normal activities       With movement 10 7. RECURRENT SYMPTOM: "Have you ever had this type of abdominal pain before?" If so, ask: "When was the last time?" and "What happened that time?"      Nop 8. CAUSE: "What do you think is causing the abdominal pain?"     Unsure 9.  RELIEVING/AGGRAVATING FACTORS: "What makes it better or worse?" (e.g., movement, antacids, bowel movement)     No 10. OTHER SYMPTOMS: "Has there been any vomiting, diarrhea, constipation, or urine problems?"       No  Protocols used: ABDOMINAL PAIN - MALE-A-AH

## 2018-09-15 ENCOUNTER — Encounter: Payer: Self-pay | Admitting: Internal Medicine

## 2018-09-15 ENCOUNTER — Ambulatory Visit (INDEPENDENT_AMBULATORY_CARE_PROVIDER_SITE_OTHER): Payer: 59 | Admitting: Internal Medicine

## 2018-09-15 VITALS — BP 104/72 | HR 78 | Temp 98.4°F | Resp 15 | Ht 72.0 in | Wt 155.6 lb

## 2018-09-15 DIAGNOSIS — R4586 Emotional lability: Secondary | ICD-10-CM | POA: Diagnosis not present

## 2018-09-15 DIAGNOSIS — R5383 Other fatigue: Secondary | ICD-10-CM

## 2018-09-15 DIAGNOSIS — Z13220 Encounter for screening for lipoid disorders: Secondary | ICD-10-CM | POA: Diagnosis not present

## 2018-09-15 DIAGNOSIS — G901 Familial dysautonomia [Riley-Day]: Secondary | ICD-10-CM

## 2018-09-15 DIAGNOSIS — Z0000 Encounter for general adult medical examination without abnormal findings: Secondary | ICD-10-CM | POA: Diagnosis not present

## 2018-09-15 DIAGNOSIS — F411 Generalized anxiety disorder: Secondary | ICD-10-CM

## 2018-09-15 DIAGNOSIS — Z113 Encounter for screening for infections with a predominantly sexual mode of transmission: Secondary | ICD-10-CM

## 2018-09-15 LAB — COMPREHENSIVE METABOLIC PANEL
ALK PHOS: 48 U/L (ref 39–117)
ALT: 15 U/L (ref 0–53)
AST: 15 U/L (ref 0–37)
Albumin: 5 g/dL (ref 3.5–5.2)
BILIRUBIN TOTAL: 3.3 mg/dL — AB (ref 0.2–1.2)
BUN: 10 mg/dL (ref 6–23)
CALCIUM: 9.7 mg/dL (ref 8.4–10.5)
CO2: 32 mEq/L (ref 19–32)
Chloride: 102 mEq/L (ref 96–112)
Creatinine, Ser: 1 mg/dL (ref 0.40–1.50)
GFR: 93.4 mL/min (ref >60.00)
Glucose, Bld: 74 mg/dL (ref 70–99)
Potassium: 4.7 mEq/L (ref 3.5–5.1)
Sodium: 140 mEq/L (ref 135–145)
TOTAL PROTEIN: 7.2 g/dL (ref 6.0–8.3)

## 2018-09-15 LAB — TSH: TSH: 1.34 u[IU]/mL (ref 0.35–4.50)

## 2018-09-15 LAB — LIPID PANEL
CHOLESTEROL: 108 mg/dL (ref 0–200)
HDL: 51.4 mg/dL (ref >39.00)
LDL Cholesterol: 40 mg/dL (ref 0–99)
NonHDL: 56.66
TRIGLYCERIDES: 82 mg/dL (ref 0.0–149.0)
Total CHOL/HDL Ratio: 2
VLDL: 16.4 mg/dL (ref 0.0–40.0)

## 2018-09-15 MED ORDER — PROPRANOLOL HCL 10 MG PO TABS: 10.0000 mg | ORAL_TABLET | Freq: Three times a day (TID) | ORAL | 0 refills | Status: DC

## 2018-09-15 MED ORDER — ALPRAZOLAM 0.25 MG PO TABS: 0.2500 mg | ORAL_TABLET | Freq: Every evening | ORAL | 0 refills | Status: DC | PRN

## 2018-09-15 NOTE — Progress Notes (Signed)
Patient ID: Christian Montgomery, male    DOB: 25-Jan-1991  Age: 27 y.o. MRN: [id number]  The patient is here for annual preventive examination and management of other chronic and acute problems.   The risk factors are reflected in the social history.  The roster of all physicians providing medical care to patient - is listed in the Snapshot section of the chart.  Activities of daily living:  The patient is 100% independent in all ADLs: dressing, toileting, feeding as well as independent mobility  Home safety : The patient has smoke detectors in the home. They wear seatbelts.  There are no firearms at home. There is no violence in the home.   There is no risks for hepatitis, STDs or HIV. There is no   history of blood transfusion. They have no travel history to infectious disease endemic areas of the world.  The patient has seen their dentist in the last six month. They have seen their eye doctor in the last year. They deny  hearing difficulty with regard to whispered voices and some television programs.    They do not  have excessive sun exposure. Discussed the need for sun protection: hats, long sleeves and use of sunscreen if there is significant sun exposure.   Diet: the importance of a healthy diet is discussed. They do have a healthy diet.  The benefits of regular aerobic exercise were discussed. He  Exercises rarely.   Depression screen: there are no signs or vegative symptoms of depression- irritability, change in appetite, anhedonia, sadness/tearfullness.  The following portions of the patient's history were reviewed and updated as appropriate: allergies, current medications, past family history, past medical history,  past surgical history, past social history  and problem list.  Visual acuity was not assessed per patient preference since she has regular follow up with her ophthalmologist. Hearing and body mass index were assessed and reviewed.   During the course of the visit the patient  was educated and counseled about appropriate screening and preventive services including : fall prevention , diabetes screening, nutrition counseling, colorectal cancer screening, and recommended immunizations.    CC: The primary encounter diagnosis was Screen for STD (sexually transmitted disease). Diagnoses of Screening for hyperlipidemia, Fatigue, unspecified type, Mood swings, Anxiety state, Dysautonomia/POTS, and Encounter for preventive health examination were also pertinent to this visit.  Fatigue.  Oversleeps frequently, sleeps right through his alarm.  gets drowsy with long car rides even if driving. Marland Kitchen He has no bed partner, so he is not sure if snores,  but very active during sleep based on condition of bed sheets when he wakes up.  Occasional insomnia lasting 90 minutes .   For the past several years he has been having periods of a  racing mind,  Frequently  thinks himself into  a panic, then his heart starts racing.  Episodes occur  4  to 5 times per month.  The episodes will last several days and are accompanied by a change in stool consistency to soft but not liquid, occurring once daily .  Denies  night sweats.  Some mild abdominal cramping relieved by stooling.  Normally has has a BM every 1-2 days.   Drinks 3 regular cokes daily.    History Christian Montgomery has a past medical history of Gastric ulcer with obstruction (April 2013).   He has a past surgical history that includes Cosmetic surgery.   His family history includes Alcohol abuse in his father; COPD in his father; Cancer in  his maternal grandmother; Diabetes in his maternal grandmother; Diabetes (age of onset: 25) in his sister; Early death in his maternal grandmother; Heart disease in his father; Heart failure in his paternal grandmother.He reports that he has never smoked. He has never used smokeless tobacco. He reports that he does not drink alcohol or use drugs.  Outpatient Medications Prior to Visit  Medication Sig Dispense Refill   . diclofenac sodium (VOLTAREN) 1 % GEL Apply 4 g topically 4 (four) times daily. 1 Tube 3  . omeprazole (PRILOSEC) 40 MG capsule Take 1 capsule (40 mg total) by mouth daily. (Patient not taking: Reported on 04/05/2018) 30 capsule 0  . predniSONE (DELTASONE) 10 MG tablet 6 tablets on Day 1 , then reduce by 1 tablet daily until gone (Patient not taking: Reported on 04/05/2018) 21 tablet 0   No facility-administered medications prior to visit.     Review of Systems  Objective:  BP 104/72 (BP Location: Left Arm, Patient Position: Sitting, Cuff Size: Normal)   Pulse 78   Temp 98.4 F (36.9 C) (Oral)   Resp 15   Ht 6' (1.829 m)   Wt 155 lb 9.6 oz (70.6 kg)   SpO2 98%   BMI 21.10 kg/m   Physical Exam    Assessment & Plan:   Problem List Items Addressed This Visit    Anxiety state    accompanied by tachycardia, increased stooling.  Occurring 4 to 5 times per month without weight loss,  Abdominal pain, and manic behavior.   Referring to Dr Maryruth Bun   Lab Results  Component Value Date   TSH 1.34 09/15/2018   Lab Results  Component Value Date   NA 140 09/15/2018   K 4.7 09/15/2018   CL 102 09/15/2018   CO2 32 09/15/2018           Relevant Medications   ALPRAZolam (XANAX) 0.25 MG tablet   Dysautonomia/POTS    Unclear if recent recurrent episodes are due to return of POTS.  Prn propranolol given      Encounter for preventive health examination    age appropriate education and counseling updated, referrals for preventative services and immunizations addressed, dietary and smoking counseling addressed, most recent labs reviewed.  I have personally reviewed and have noted:  1) the patient's medical and social history 2) The pt's use of alcohol, tobacco, and illicit drugs 3) The patient's current medications and supplements 4) Functional ability including ADL's, fall risk, home safety risk, hearing and visual impairment 5) Diet and physical activities 6) Evidence for depression  or mood disorder 7) The patient's height, weight, and BMI have been recorded in the chart  I have made referrals, and provided counseling and education based on review of the above  Lab Results  Component Value Date   CHOL 108 09/15/2018   HDL 51.40 09/15/2018   LDLCALC 40 09/15/2018   TRIG 82.0 09/15/2018   CHOLHDL 2 09/15/2018         Mood swings    Referral to Dr Maryruth Bun for evaluation of symptoms suggesting hypomania or bipolar disorder is screening labs are normal.         Other Visit Diagnoses    Screen for STD (sexually transmitted disease)    -  Primary   Relevant Orders   HIV Antibody (routine testing w rflx) (Completed)   Hepatitis C antibody   Screening for hyperlipidemia       Relevant Orders   Lipid panel (Completed)   Fatigue, unspecified  type       Relevant Orders   Comprehensive metabolic panel (Completed)   TSH (Completed)   CBC w/Diff      I have discontinued Dorene Sorrow L. Wimes's predniSONE, omeprazole, and diclofenac sodium. I am also having him start on ALPRAZolam and propranolol.  Meds ordered this encounter  Medications  . ALPRAZolam (XANAX) 0.25 MG tablet    Sig: Take 1 tablet (0.25 mg total) by mouth at bedtime as needed for anxiety or sleep.    Dispense:  20 tablet    Refill:  0  . propranolol (INDERAL) 10 MG tablet    Sig: Take 1 tablet (10 mg total) by mouth 3 (three) times daily. As needed for rapid heart rate    Dispense:  30 tablet    Refill:  0    Medications Discontinued During This Encounter  Medication Reason  . predniSONE (DELTASONE) 10 MG tablet Completed Course  . omeprazole (PRILOSEC) 40 MG capsule Completed Course  . diclofenac sodium (VOLTAREN) 1 % GEL Completed Course    Follow-up: No follow-ups on file.   Sherlene Shams, MD

## 2018-09-15 NOTE — Patient Instructions (Addendum)
FOR YOUR TOENAIL FUNGUS :   Use tee trea oil nightly after shower and after quick swipe with emory board      For your insomnia:  I am prescribing alprazolam to help you sleep .  Do not use it every day .    For your episodes of racing heart:  I am prescribing propranolol  .  This will slow your heart rate down for a few hours.  It can be taken every 8 hours AS NEEDED I also recommend cutting back on your caffeine intake during these episodes     I AM MAKING A REFERRAL  to Dr Maryruth Bun  To determine if you have bipolar disorder   Health Maintenance, Male A healthy lifestyle and preventive care is important for your health and wellness. Ask your health care provider about what schedule of regular examinations is right for you. What should I know about weight and diet? Eat a Healthy Diet  Eat plenty of vegetables, fruits, whole grains, low-fat dairy products, and lean protein.  Do not eat a lot of foods high in solid fats, added sugars, or salt.  Maintain a Healthy Weight Regular exercise can help you achieve or maintain a healthy weight. You should:  Do at least 150 minutes of exercise each week. The exercise should increase your heart rate and make you sweat (moderate-intensity exercise).  Do strength-training exercises at least twice a week.  Watch Your Levels of Cholesterol and Blood Lipids  Have your blood tested for lipids and cholesterol every 5 years starting at 29 years of age. If you are at high risk for heart disease, you should start having your blood tested when you are 29 years old. You may need to have your cholesterol levels checked more often if: ? Your lipid or cholesterol levels are high. ? You are older than 29 years of age. ? You are at high risk for heart disease.  What should I know about cancer screening? Many types of cancers can be detected early and may often be prevented. Lung Cancer  You should be screened every year for lung cancer if: ? You are  a current smoker who has smoked for at least 30 years. ? You are a former smoker who has quit within the past 15 years.  Talk to your health care provider about your screening options, when you should start screening, and how often you should be screened.  Colorectal Cancer  Routine colorectal cancer screening usually begins at 29 years of age and should be repeated every 5-10 years until you are 29 years old. You may need to be screened more often if early forms of precancerous polyps or small growths are found. Your health care provider may recommend screening at an earlier age if you have risk factors for colon cancer.  Your health care provider may recommend using home test kits to check for hidden blood in the stool.  A small camera at the end of a tube can be used to examine your colon (sigmoidoscopy or colonoscopy). This checks for the earliest forms of colorectal cancer.  Prostate and Testicular Cancer  Depending on your age and overall health, your health care provider may do certain tests to screen for prostate and testicular cancer.  Talk to your health care provider about any symptoms or concerns you have about testicular or prostate cancer.  Skin Cancer  Check your skin from head to toe regularly.  Tell your health care provider about any new moles or  changes in moles, especially if: ? There is a change in a mole's size, shape, or color. ? You have a mole that is larger than a pencil eraser.  Always use sunscreen. Apply sunscreen liberally and repeat throughout the day.  Protect yourself by wearing long sleeves, pants, a wide-brimmed hat, and sunglasses when outside.  What should I know about heart disease, diabetes, and high blood pressure?  If you are 62-33 years of age, have your blood pressure checked every 3-5 years. If you are 7 years of age or older, have your blood pressure checked every year. You should have your blood pressure measured twice-once when you are  at a hospital or clinic, and once when you are not at a hospital or clinic. Record the average of the two measurements. To check your blood pressure when you are not at a hospital or clinic, you can use: ? An automated blood pressure machine at a pharmacy. ? A home blood pressure monitor.  Talk to your health care provider about your target blood pressure.  If you are between 29-27 years old, ask your health care provider if you should take aspirin to prevent heart disease.  Have regular diabetes screenings by checking your fasting blood sugar level. ? If you are at a normal weight and have a low risk for diabetes, have this test once every three years after the age of 76. ? If you are overweight and have a high risk for diabetes, consider being tested at a younger age or more often.  A one-time screening for abdominal aortic aneurysm (AAA) by ultrasound is recommended for men aged 65-75 years who are current or former smokers. What should I know about preventing infection? Hepatitis B If you have a higher risk for hepatitis B, you should be screened for this virus. Talk with your health care provider to find out if you are at risk for hepatitis B infection. Hepatitis C Blood testing is recommended for:  Everyone born from [birth year] through 1967.  Anyone with known risk factors for hepatitis C.  Sexually Transmitted Diseases (STDs)  You should be screened each year for STDs including gonorrhea and chlamydia if: ? You are sexually active and are younger than 29 years of age. ? You are older than 29 years of age and your health care provider tells you that you are at risk for this type of infection. ? Your sexual activity has changed since you were last screened and you are at an increased risk for chlamydia or gonorrhea. Ask your health care provider if you are at risk.  Talk with your health care provider about whether you are at high risk of being infected with HIV. Your health care  provider may recommend a prescription medicine to help prevent HIV infection.  What else can I do?  Schedule regular health, dental, and eye exams.  Stay current with your vaccines (immunizations).  Do not use any tobacco products, such as cigarettes, chewing tobacco, and e-cigarettes. If you need help quitting, ask your health care provider.  Limit alcohol intake to no more than 2 drinks per day. One drink equals 12 ounces of beer, 5 ounces of wine, or 1 ounces of hard liquor.  Do not use street drugs.  Do not share needles.  Ask your health care provider for help if you need support or information about quitting drugs.  Tell your health care provider if you often feel depressed.  Tell your health care provider if you have ever  been abused or do not feel safe at home. This information is not intended to replace advice given to you by your health care provider. Make sure you discuss any questions you have with your health care provider. Document Released: 05/15/2008 Document Revised: 07/16/2016 Document Reviewed: 08/21/2015 Elsevier Interactive Patient Education  [employer].

## 2018-09-16 DIAGNOSIS — F411 Generalized anxiety disorder: Secondary | ICD-10-CM | POA: Insufficient documentation

## 2018-09-16 DIAGNOSIS — Z113 Encounter for screening for infections with a predominantly sexual mode of transmission: Secondary | ICD-10-CM | POA: Insufficient documentation

## 2018-09-16 DIAGNOSIS — R4586 Emotional lability: Secondary | ICD-10-CM | POA: Insufficient documentation

## 2018-09-16 DIAGNOSIS — Z0000 Encounter for general adult medical examination without abnormal findings: Principal | ICD-10-CM

## 2018-09-16 DIAGNOSIS — F4310 Post-traumatic stress disorder, unspecified: Secondary | ICD-10-CM | POA: Insufficient documentation

## 2018-09-16 LAB — HEPATITIS C ANTIBODY
Hepatitis C Ab: NONREACTIVE
SIGNAL TO CUT-OFF: 0.01 (ref <1.00)

## 2018-09-16 LAB — HIV ANTIBODY (ROUTINE TESTING W REFLEX): HIV 1&2 Ab, 4th Generation: NONREACTIVE

## 2018-09-16 NOTE — Assessment & Plan Note (Addendum)
age appropriate education and counseling updated, referrals for preventative services and immunizations addressed, dietary and smoking counseling addressed, most recent labs reviewed.  I have personally reviewed and have noted:  1) the patient's medical and social history 2) The pt's use of alcohol, tobacco, and illicit drugs 3) The patient's current medications and supplements 4) Functional ability including ADL's, fall risk, home safety risk, hearing and visual impairment 5) Diet and physical activities 6) Evidence for depression or mood disorder 7) The patient's height, weight, and BMI have been recorded in the chart  I have made referrals, and provided counseling and education based on review of the above  Lab Results  Component Value Date   CHOL 108 09/15/2018   HDL 51.40 09/15/2018   LDLCALC 40 09/15/2018   TRIG 82.0 09/15/2018   CHOLHDL 2 09/15/2018

## 2018-09-16 NOTE — Assessment & Plan Note (Addendum)
accompanied by tachycardia, increased stooling.  Occurring 4 to 5 times per month without weight loss,  Abdominal pain, and manic behavior.   Referring to Dr Maryruth Bun   Lab Results  Component Value Date   TSH 1.34 09/15/2018   Lab Results  Component Value Date   NA 140 09/15/2018   K 4.7 09/15/2018   CL 102 09/15/2018   CO2 32 09/15/2018

## 2018-09-16 NOTE — Assessment & Plan Note (Signed)
Unclear if recent recurrent episodes are due to return of POTS.  Prn propranolol given

## 2018-09-16 NOTE — Assessment & Plan Note (Signed)
Referral to Dr Maryruth Bun for evaluation of symptoms suggesting hypomania or bipolar disorder is screening labs are normal.

## 2018-11-18 ENCOUNTER — Ambulatory Visit (INDEPENDENT_AMBULATORY_CARE_PROVIDER_SITE_OTHER): Payer: 59 | Admitting: Internal Medicine

## 2018-11-18 ENCOUNTER — Encounter: Payer: Self-pay | Admitting: Internal Medicine

## 2018-11-18 VITALS — BP 128/78 | HR 82 | Temp 97.8°F | Resp 15 | Ht 72.0 in | Wt 162.0 lb

## 2018-11-18 DIAGNOSIS — M5412 Radiculopathy, cervical region: Secondary | ICD-10-CM | POA: Diagnosis not present

## 2018-11-18 MED ORDER — PREDNISONE 10 MG PO TABS: ORAL_TABLET | ORAL | 0 refills | Status: DC

## 2018-11-18 MED ORDER — MELOXICAM 15 MG PO TABS: 15.0000 mg | ORAL_TABLET | Freq: Every day | ORAL | 0 refills | Status: DC

## 2018-11-18 NOTE — Progress Notes (Signed)
Subjective:  Patient ID: Lenox PondsJerry L Davlin, male    DOB: 19-Sep-1991  Age: 27 y.o. MRN: [id number]  CC: The primary encounter diagnosis was Cervical radicular pain. A diagnosis of Cervical radiculopathy was also pertinent to this visit.  HPI Lenox PondsJerry L Aracena presents for evaluation of intermittent scalp pain involving right parietal and temporal area accompanied by numbness and tingling .  symptoms lhave been present for several months and are aggravated by fexion and extensnion of neck and turning head   Was evaluated in January for neck and shoulder pain and cervical spine films were normal. 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      Outpatient Medications Prior to Visit  Medication Sig Dispense Refill  . ALPRAZolam (XANAX) 0.25 MG tablet Take 1 tablet (0.25 mg total) by mouth at bedtime as needed for anxiety or sleep. 20 tablet 0  . propranolol (INDERAL) 10 MG tablet Take 1 tablet (10 mg total) by mouth 3 (three) times daily. As needed for rapid heart rate 30 tablet 0   No facility-administered medications prior to visit.     Review of Systems;  Patient denies headache, fevers, malaise, unintentional weight loss, skin rash, eye pain, sinus congestion and sinus pain, sore throat, dysphagia,  hemoptysis , cough, dyspnea, wheezing, chest pain, palpitations, orthopnea, edema, abdominal pain, nausea, melena, diarrhea, constipation, flank pain, dysuria, hematuria, urinary  Frequency, nocturia, numbness, tingling, seizures,  Focal weakness, Loss of consciousness,  Tremor, insomnia, depression, anxiety, and suicidal ideation.      Objective:  BP 128/78 (BP Location: Left Arm, Patient Position: Sitting, Cuff Size: Normal)   Pulse 82   Temp 97.8 F (36.6 C) (Oral)   Resp 15   Ht 6' (1.829 m)   Wt 162 lb (73.5 kg)   SpO2 98%   BMI 21.97 kg/m   BP Readings from Last 3 Encounters:  11/18/18 128/78  09/15/18 104/72  04/05/18 122/80    Wt Readings from  Last 3 Encounters:  11/18/18 162 lb (73.5 kg)  09/15/18 155 lb 9.6 oz (70.6 kg)  04/05/18 155 lb 4 oz (70.4 kg)    General appearance: alert, cooperative and appears stated age Ears: normal TM's and external ear canals both ears Throat: lips, mucosa, and tongue normal; teeth and gums normal Neck: no adenopathy, no carotid bruit, supple, symmetrical, trachea midline and thyroid not enlarged, symmetric, no tenderness/mass/nodules Back: symmetric, no curvature. ROM normal. No CVA tenderness. Lungs: clear to auscultation bilaterally Heart: regular rate and rhythm, S1, S2 normal, no murmur, click, rub or gallop Abdomen: soft, non-tender; bowel sounds normal; no masses,  no organomegaly Pulses: 2+ and symmetric Skin: Skin color, texture, turgor normal. No rashes or lesions Lymph nodes: Cervical, supraclavicular, and axillary nodes normal.  Lab Results  Component Value Date   HGBA1C 5.4 05/27/2013   HGBA1C 5.4 09/21/2012    Lab Results  Component Value Date   CREATININE 1.00 09/15/2018   CREATININE 0.94 11/14/2016   CREATININE 1.02 11/12/2015    Lab Results  Component Value Date   WBC 4.1 11/14/2016   HGB 15.4 11/14/2016   HCT 44.8 11/14/2016   PLT 184.0 11/14/2016   GLUCOSE 74 09/15/2018   CHOL 108 09/15/2018   TRIG 82.0 09/15/2018   HDL 51.40 09/15/2018   LDLCALC 40 09/15/2018   ALT 15 09/15/2018   AST 15 09/15/2018   NA 140 09/15/2018   K 4.7 09/15/2018   CL 102 09/15/2018   CREATININE  1.00 09/15/2018   BUN 10 09/15/2018   CO2 32 09/15/2018   TSH 1.34 09/15/2018   INR 1.3 (H) 02/24/2014   HGBA1C 5.4 05/27/2013    Dg Hand Complete Right  Result Date: 03/11/2016 CLINICAL DATA:  Injury.  Pain and swelling. EXAM: RIGHT HAND - COMPLETE 3+ VIEW COMPARISON:  No recent prior. FINDINGS: No acute bony or joint abnormality. IMPRESSION: No acute abnormality. Electronically Signed   By: Maisie Fushomas  Register   On: 03/11/2016 09:48    Assessment & Plan:   Problem List Items  Addressed This Visit    Cervical radiculopathy    Right sided,  With C3 a to c6 distribution.  Trial of prednisone/tylenol ,  Followed by meloxicam/tylenol. If no improvement  mRI cervical spine        Other Visit Diagnoses    Cervical radicular pain    -  Primary   Relevant Medications   predniSONE (DELTASONE) 10 MG tablet   meloxicam (MOBIC) 15 MG tablet      I am having Jae L. Mcauliff start on predniSONE and meloxicam. I am also having him maintain his ALPRAZolam and propranolol.  Meds ordered this encounter  Medications  . predniSONE (DELTASONE) 10 MG tablet    Sig: 6 tablets on Day 1 , then reduce by 1 tablet daily until gone    Dispense:  21 tablet    Refill:  0  . meloxicam (MOBIC) 15 MG tablet    Sig: Take 1 tablet (15 mg total) by mouth daily.    Dispense:  30 tablet    Refill:  0    DO NOT START UNTIL AFTER THE PREDNISONE TAPER HAS BEEN COMPLTE    There are no discontinued medications.  Follow-up: No follow-ups on file.   Sherlene Shamseresa L Tullo, MD

## 2018-11-18 NOTE — Assessment & Plan Note (Addendum)
Right sided,  With C3 a to c6 distribution.  Trial of prednisone/tylenol ,  Followed by meloxicam/tylenol. If no improvement  mRI cervical spine

## 2018-11-18 NOTE — Patient Instructions (Signed)
I AM PRESCRIBING PREDNISONE to take in a tapering dose fo rhte next 6 days    6 tablets all at once on Day 1 IN THE MORNING ,  Then taper by 1 tablet daily until gone   You can add up to 2000 mg of acetominophen (tylenol) every day safely  In divided doses (500 mg every 6 hours  Or 1000 mg every 12 hours.)  AFTER YOU FINISH THE PREDNISONE,  IF YOU ARE STILL HAVING PAIN YOU CAN START ONCE DAILY MELOXICAM AS YOUR ANTI INFLAMMATORY And continue tylenol as well.  IF YOUR PAIN IS NOT CONTROLLED WITH THIS REGIMEN,  LET ME KNOW AND WE WILL GET AN mri OF YOUR CERVICAL SPINE   Cervical Radiculopathy  Cervical radiculopathy happens when a nerve in the neck (cervical nerve) is pinched or bruised. This condition can develop because of an injury or as part of the normal aging process. Pressure on the cervical nerves can cause pain or numbness that runs from the neck all the way down into the arm and fingers. Usually, this condition gets better with rest. Treatment may be needed if the condition does not improve. What are the causes? This condition may be caused by:  Injury.  Slipped (herniated) disk.  Muscle tightness in the neck because of overuse.  Arthritis.  Breakdown or degeneration in the bones and joints of the spine (spondylosis) due to aging.  Bone spurs that may develop near the cervical nerves. What are the signs or symptoms? Symptoms of this condition include:  Pain that runs from the neck to the arm and hand. The pain can be severe or irritating. It may be worse when the neck is moved.  Numbness or weakness in the affected arm and hand. How is this diagnosed? This condition may be diagnosed based on symptoms, medical history, and a physical exam. You may also have tests, including:  X-rays.  CT scan.  MRI.  Electromyogram (EMG).  Nerve conduction tests. How is this treated? In many cases, treatment is not needed for this condition. With rest, the condition usually gets  better over time. If treatment is needed, options may include:  Wearing a soft neck collar for short periods of time.  Physical therapy to strengthen your neck muscles.  Medicines, such as NSAIDs, oral corticosteroids, or spinal injections.  Surgery. This may be needed if other treatments do not help. Various types of surgery may be done depending on the cause of your problems. Follow these instructions at home: Managing pain  Take over-the-counter and prescription medicines only as told by your health care provider.  If directed, apply ice to the affected area. ? Put ice in a plastic bag. ? Place a towel between your skin and the bag. ? Leave the ice on for 20 minutes, 2-3 times per day.  If ice does not help, you can try using heat. Take a warm shower or warm bath, or use a heat pack as told by your health care provider.  Try a gentle neck and shoulder massage to help relieve symptoms. Activity  Rest as needed. Follow instructions from your health care provider about any restrictions on activities.  Do stretching and strengthening exercises as told by your health care provider or physical therapist. General instructions  If you were given a soft collar, wear it as told by your health care provider.  Use a flat pillow when you sleep.  Keep all follow-up visits as told by your health care provider. This is  important. Contact a health care provider if:  Your condition does not improve with treatment. Get help right away if:  Your pain gets much worse and cannot be controlled with medicines.  You have weakness or numbness in your hand, arm, face, or leg.  You have a high fever.  You have a stiff, rigid neck.  You lose control of your bowels or your bladder (have incontinence).  You have trouble with walking, balance, or speaking. This information is not intended to replace advice given to you by your health care provider. Make sure you discuss any questions you have  with your health care provider. Document Released: 08/12/2001 Document Revised: 04/24/2016 Document Reviewed: 01/11/2015 Elsevier Interactive Patient Education  [employer]2019 Else[employer] Inc[employer]

## 2018-12-16 DIAGNOSIS — M5412 Radiculopathy, cervical region: Secondary | ICD-10-CM

## 2019-01-05 ENCOUNTER — Ambulatory Visit: Payer: 59

## 2019-01-06 ENCOUNTER — Ambulatory Visit
Admission: RE | Admit: 2019-01-06 | Discharge: 2019-01-06 | Disposition: A | Discharge status: Home / Self Care | Payer: 59 | Source: Ambulatory Visit | Attending: Internal Medicine | Admitting: Internal Medicine

## 2019-01-06 DIAGNOSIS — M5010 Cervical disc disorder with radiculopathy, unspecified cervical region: Secondary | ICD-10-CM | POA: Diagnosis not present

## 2019-01-06 DIAGNOSIS — M4802 Spinal stenosis, cervical region: Secondary | ICD-10-CM | POA: Diagnosis not present

## 2019-01-06 DIAGNOSIS — M4722 Other spondylosis with radiculopathy, cervical region: Secondary | ICD-10-CM | POA: Diagnosis not present

## 2019-01-06 DIAGNOSIS — M5412 Radiculopathy, cervical region: Secondary | ICD-10-CM

## 2019-01-18 DIAGNOSIS — M5030 Other cervical disc degeneration, unspecified cervical region: Secondary | ICD-10-CM | POA: Diagnosis not present

## 2019-01-18 DIAGNOSIS — R51 Headache: Secondary | ICD-10-CM | POA: Diagnosis not present

## 2019-01-19 ENCOUNTER — Other Ambulatory Visit: Payer: Self-pay | Admitting: Neurosurgery

## 2019-01-19 DIAGNOSIS — R51 Headache: Principal | ICD-10-CM

## 2019-01-19 DIAGNOSIS — R519 Headache, unspecified: Secondary | ICD-10-CM

## 2019-01-27 DIAGNOSIS — M6281 Muscle weakness (generalized): Secondary | ICD-10-CM | POA: Diagnosis not present

## 2019-01-27 DIAGNOSIS — M5030 Other cervical disc degeneration, unspecified cervical region: Secondary | ICD-10-CM | POA: Diagnosis not present

## 2019-01-31 ENCOUNTER — Ambulatory Visit
Admission: RE | Admit: 2019-01-31 | Discharge: 2019-01-31 | Disposition: A | Discharge status: Home / Self Care | Payer: 59 | Source: Ambulatory Visit | Attending: Neurosurgery | Admitting: Neurosurgery

## 2019-01-31 DIAGNOSIS — R200 Anesthesia of skin: Secondary | ICD-10-CM | POA: Diagnosis not present

## 2019-01-31 DIAGNOSIS — R51 Headache: Secondary | ICD-10-CM | POA: Insufficient documentation

## 2019-01-31 DIAGNOSIS — R519 Headache, unspecified: Secondary | ICD-10-CM

## 2019-02-03 DIAGNOSIS — M5030 Other cervical disc degeneration, unspecified cervical region: Secondary | ICD-10-CM | POA: Diagnosis not present

## 2019-02-03 DIAGNOSIS — M6281 Muscle weakness (generalized): Secondary | ICD-10-CM | POA: Diagnosis not present

## 2019-02-10 DIAGNOSIS — R748 Abnormal levels of other serum enzymes: Secondary | ICD-10-CM | POA: Diagnosis not present

## 2019-02-10 DIAGNOSIS — W000XXA Fall on same level due to ice and snow, initial encounter: Secondary | ICD-10-CM | POA: Diagnosis not present

## 2019-02-10 DIAGNOSIS — R0781 Pleurodynia: Secondary | ICD-10-CM | POA: Diagnosis not present

## 2019-02-10 DIAGNOSIS — S20211A Contusion of right front wall of thorax, initial encounter: Secondary | ICD-10-CM | POA: Diagnosis not present

## 2019-02-10 DIAGNOSIS — M5030 Other cervical disc degeneration, unspecified cervical region: Secondary | ICD-10-CM | POA: Diagnosis not present

## 2019-02-10 DIAGNOSIS — M6281 Muscle weakness (generalized): Secondary | ICD-10-CM | POA: Diagnosis not present

## 2019-02-23 DIAGNOSIS — M6281 Muscle weakness (generalized): Secondary | ICD-10-CM | POA: Diagnosis not present

## 2019-02-23 DIAGNOSIS — M5030 Other cervical disc degeneration, unspecified cervical region: Secondary | ICD-10-CM | POA: Diagnosis not present

## 2019-03-09 ENCOUNTER — Ambulatory Visit (INDEPENDENT_AMBULATORY_CARE_PROVIDER_SITE_OTHER): Payer: 59 | Admitting: Internal Medicine

## 2019-03-09 DIAGNOSIS — J301 Allergic rhinitis due to pollen: Secondary | ICD-10-CM | POA: Diagnosis not present

## 2019-03-09 DIAGNOSIS — Z72821 Inadequate sleep hygiene: Secondary | ICD-10-CM | POA: Diagnosis not present

## 2019-03-09 DIAGNOSIS — M5030 Other cervical disc degeneration, unspecified cervical region: Secondary | ICD-10-CM | POA: Diagnosis not present

## 2019-03-09 DIAGNOSIS — M6281 Muscle weakness (generalized): Secondary | ICD-10-CM | POA: Diagnosis not present

## 2019-03-09 DIAGNOSIS — M5412 Radiculopathy, cervical region: Secondary | ICD-10-CM | POA: Diagnosis not present

## 2019-03-09 MED ORDER — MONTELUKAST SODIUM 10 MG PO TABS: 10.0000 mg | ORAL_TABLET | Freq: Every day | ORAL | 3 refills | Status: DC

## 2019-03-09 MED ORDER — PREDNISONE 10 MG PO TABS: ORAL_TABLET | ORAL | 0 refills | Status: DC

## 2019-03-09 MED ORDER — LEVOCETIRIZINE DIHYDROCHLORIDE 5 MG PO TABS: 5.0000 mg | ORAL_TABLET | Freq: Every evening | ORAL | 5 refills | Status: DC

## 2019-03-09 MED ORDER — AZELASTINE-FLUTICASONE 137-50 MCG/ACT NA SUSP: 1.0000 | Freq: Two times a day (BID) | NASAL | 11 refills | Status: DC

## 2019-03-09 NOTE — Patient Instructions (Addendum)
For your allergies:   I recommend Starting xyzal  For your allergies. Take it once daily in the morning,  Ok to continue zyrtec but take your dose 12 hours later .  I am Changing  Your flonase to Dymista nasal spray  , it contains Flonase PLUS an antihistamine . You can use every 12 hours  Prednisone taper:  Start tomorrow   You should flush your sinuses after being outside  With  [employer]'s Sinus rinse ;  It is a stong sinus "flush" using water and medicated salts.  Do it over the sink because it can be a bit messy

## 2019-03-09 NOTE — Progress Notes (Signed)
Virtual Visit via  Video  Note  This visit type was conducted due to national recommendations for restrictions regarding the COVID-19 pandemic (e.g. social distancing).  This format is felt to be most appropriate for this patient at this time.  All issues noted in this document were discussed and addressed.  No physical exam was performed (except for noted visual exam findings with Video Visits).   I connected with@ on 03/09/19 at  9:00 AM EDT by a video enabled telemedicine application or telephone and verified that I am speaking with the correct person using two identifiers. Location patient: home Location provider: work Persons participating in the virtual visit: patient, provider  I discussed the limitations, risks, security and privacy concerns of performing an evaluation and management service by telephone and the availability of in person appointments. I also discussed with the patient that there may be a patient responsible charge related to this service. The patient expressed understanding and agreed to proceed   Reason for visit: cough   HPI: 28 yr old male presents with sinus pressure acc'd by sneezing, itchy eyes, whinitis and cough.   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  Denies sinus pain, Purulent discharge, fevers , dyspnea and  body aches, but his cough makes his co  Employees and customers nervous.  Taking zyrtec once daily   Follow up on cervical radiculopathy noted in December  With MRI showing mild spinal stenosis a C6-7.  Has been receiving PT  ordered by neurosurgeon Dr. Adriana Simas. Feels that the PT is helping . No longer having pain radiating down either arm.  Morning sleepiness:  Having trouble waking up.  Sleep quantity only 5 to 6 hours daily due to work schedule.    ROS: See pertinent positives and negatives per HPI.  Past Medical History:  Diagnosis  Date  . Gastric ulcer with obstruction April 2013   hosp [facility] , [facility]    Past Surgical History:  Procedure Laterality Date  . COSMETIC SURGERY      Family History  Problem Relation Age of Onset  . COPD Father   . Alcohol abuse Father   . Heart disease Father   . Diabetes Sister 26       gestational, not overweight   . Diabetes Maternal Grandmother   . Cancer Maternal Grandmother        endometrial  . Early death Maternal Grandmother        cause unknown  . Heart failure Paternal Grandmother     SOCIAL HX: single male. No tobacco or alcohol   Current Outpatient Medications:  .  ALPRAZolam (XANAX) 0.25 MG tablet, Take 1 tablet (0.25 mg total) by mouth at bedtime as needed for anxiety or sleep., Disp: 20 tablet, Rfl: 0 .  propranolol (INDERAL) 10 MG tablet, Take 1 tablet (10 mg total) by mouth 3 (three) times daily. As needed for rapid heart rate, Disp: 30 tablet, Rfl: 0 .  montelukast (SINGULAIR) 10 MG tablet, Take 1 tablet (10 mg total) by mouth at bedtime., Disp: 30 tablet, Rfl: 3 .  predniSONE (DELTASONE) 10 MG tablet, 6 tablets on Day 1 , then reduce by 1 tablet daily until gone, Disp: 21 tablet, Rfl: 0  EXAM:  VITALS per patient if applicable:  GENERAL: alert, oriented, appears well and in no acute distress  HEENT: atraumatic, conjunttiva clear, no obvious abnormalities on inspection of external nose and ears  NECK: normal movements of the head and neck  LUNGS: on inspection no signs of respiratory distress, breathing rate appears normal, no obvious gross SOB, gasping or wheezing  CV: no obvious cyanosis  MS: moves all visible extremities without noticeable abnormality  PSYCH/NEURO: pleasant and cooperative, no obvious depression or anxiety, speech and thought processing grossly intact  ASSESSMENT AND PLAN:  Discussed the following assessment and plan:  Seasonal allergic rhinitis due to pollen  Cervical radiculopathy  Inadequate sleep  hygiene  Allergic rhinitis Adding singulair, prednisone taper and daily sinus irrigations.  Continue flonase and zyrtec   Cervical radiculopathy Symptoms have improved with PT ordered by Neurosurgery in lieu of surgical decompression  Inadequate sleep hygiene Advised to reduce work hours in order to increase sleep hours to  A minimum of 8 hours daily     I discussed the assessment and treatment plan with the patient. The patient was provided an opportunity to ask questions and all were answered. The patient agreed with the plan and demonstrated an understanding of the instructions.   The patient was advised to call back or seek an in-person evaluation if the symptoms worsen or if the condition fails to improve as anticipated.  I provided 25 minutes of non-face-to-face time during this encounter.   Sherlene Shamseresa L Nareh Matzke, MD

## 2019-03-11 DIAGNOSIS — Z72821 Inadequate sleep hygiene: Secondary | ICD-10-CM | POA: Insufficient documentation

## 2019-03-11 NOTE — Assessment & Plan Note (Signed)
Symptoms have improved with PT ordered by Neurosurgery in lieu of surgical decompression

## 2019-03-11 NOTE — Assessment & Plan Note (Signed)
Advised to reduce work hours in order to increase sleep hours to  A minimum of 8 hours daily

## 2019-03-11 NOTE — Assessment & Plan Note (Signed)
Adding singulair, prednisone taper and daily sinus irrigations.  Continue flonase and zyrtec

## 2019-06-01 ENCOUNTER — Other Ambulatory Visit: Payer: Self-pay

## 2019-06-02 ENCOUNTER — Encounter: Payer: Self-pay | Admitting: Family Medicine

## 2019-06-02 ENCOUNTER — Other Ambulatory Visit: Payer: Self-pay

## 2019-06-02 ENCOUNTER — Ambulatory Visit (INDEPENDENT_AMBULATORY_CARE_PROVIDER_SITE_OTHER): Payer: 59

## 2019-06-02 ENCOUNTER — Ambulatory Visit (INDEPENDENT_AMBULATORY_CARE_PROVIDER_SITE_OTHER): Payer: 59 | Admitting: Family Medicine

## 2019-06-02 VITALS — BP 98/62 | HR 73 | Temp 97.9°F | Resp 16 | Ht 74.0 in | Wt 161.0 lb

## 2019-06-02 DIAGNOSIS — M25532 Pain in left wrist: Secondary | ICD-10-CM | POA: Diagnosis not present

## 2019-06-02 NOTE — Progress Notes (Signed)
Subjective:    Patient ID: Christian Montgomery, male    DOB: Feb 20, 1991, 28 y.o.   MRN: [id number]  HPI   Patient presents to clinic complaining of left wrist pain.  Pain is been present for 3 weeks.  Denies any injury or known trauma to the wrist.  Denies any past history of wrist pain, arthritis or wrist injury.  Patient states he does do a lot of lifting throughout the day, but cannot recall any time where his wrist began to hurt.  Patient Active Problem List   Diagnosis Date Noted  . Inadequate sleep hygiene 03/11/2019  . Cervical radiculopathy 11/18/2018  . Mood swings 09/16/2018  . Anxiety state 09/16/2018  . Encounter for preventive health examination 09/16/2018  . IBS (irritable bowel syndrome) 12/30/2015  . Dysautonomia/POTS 09/06/2013  . Allergic rhinitis 03/25/2013  . Constipation - functional 10/14/2012  . Gilbert's disease 09/22/2012  . Personal history of gastric ulcer 03/01/2012   Social History   Tobacco Use  . Smoking status: Never Smoker  . Smokeless tobacco: Never Used  Substance Use Topics  . Alcohol use: No   Review of Systems  Constitutional: Negative for chills, fatigue and fever.  HENT: Negative for congestion, ear pain, sinus pain and sore throat.   Eyes: Negative.   Respiratory: Negative for cough, shortness of breath and wheezing.   Cardiovascular: Negative for chest pain, palpitations and leg swelling.  Gastrointestinal: Negative for abdominal pain, diarrhea, nausea and vomiting.  Genitourinary: Negative for dysuria, frequency and urgency.  Musculoskeletal:+left wrist pain Skin: Negative for color change, pallor and rash.  Neurological: Negative for syncope, light-headedness and headaches.  Psychiatric/Behavioral: The patient is not nervous/anxious.       Objective:   Physical Exam Vitals signs and nursing note reviewed.  Cardiovascular:     Rate and Rhythm: Normal rate and regular rhythm.     Comments: Radial and ulnar pulses normal.  Pulmonary:     Effort: Pulmonary effort is normal.     Breath sounds: Normal breath sounds.  Musculoskeletal:       Arms:     Comments: Point tenderness indicated by red mark on diagram.  Patient is able to wiggle all fingers and give a thumbs up without any problems.  Able to make a complete fist without issues.  Pain in the wrist when moving it joint from side to side and also up-and-down motion.  Grip is normal and strong in left hand.  Neurological:     Mental Status: He is oriented to person, place, and time.  Psychiatric:        Mood and Affect: Mood normal.        Behavior: Behavior normal.    Vitals:   06/02/19 1059  BP: 98/62  Pulse: 73  Resp: 16  Temp: 97.9 F (36.6 C)  SpO2: 98%       Assessment & Plan:    Left wrist pain- unclear reason for pain.  We will get x-ray to further investigate.  He has been advised to wear a wrist brace/splint to give compression and support of the joint and use ibuprofen as needed for pain relief.  If x-ray is unremarkable for any abnormality, next step in plan of care will be to refer to sports med or orthopedics for further evaluation of the joint pain.  Patient will keep regularly scheduled follow-up with PCP as planned and return to clinic sooner if any issues arise.  Aware he will be contacted with x-ray results  when they are available.

## 2019-06-15 ENCOUNTER — Encounter: Payer: Self-pay | Admitting: Internal Medicine

## 2019-06-15 ENCOUNTER — Other Ambulatory Visit: Payer: Self-pay

## 2019-06-15 ENCOUNTER — Ambulatory Visit (INDEPENDENT_AMBULATORY_CARE_PROVIDER_SITE_OTHER): Payer: 59 | Admitting: Internal Medicine

## 2019-06-15 DIAGNOSIS — Z113 Encounter for screening for infections with a predominantly sexual mode of transmission: Secondary | ICD-10-CM

## 2019-06-15 DIAGNOSIS — R143 Flatulence: Secondary | ICD-10-CM | POA: Diagnosis not present

## 2019-06-15 NOTE — Patient Instructions (Signed)
Your STD testing can all be done through a urine and blood sample  Christian Montgomery will call you with the appointment.  Refrain from any sexual contact with your wife until she is fully treated AND you are tested    For your gas problem:  Use Beano at every meal that contains vegetables (includes beans and  Salad)  Use Gas X or phasyme  As needed once gas occurs  Treat the constipation with colace (docusate 100 mg at bedtime) . This is a stool softener

## 2019-06-15 NOTE — Progress Notes (Signed)
Virtual Visit via [email or web address]  This visit type was conducted due to national recommendations for restrictions regarding the COVID-19 pandemic (e.g. social distancing).  This format is felt to be most appropriate for this patient at this time.  All issues noted in this document were discussed and addressed.  No physical exam was performed (except for noted visual exam findings with Video Visits).   I connected with@ on 06/15/19 at  1:30 PM EDT by a video enabled telemedicine application and verified that I am speaking with the correct person using two identifiers. Location patient: home Location provider: work or home office Persons participating in the virtual visit: patient, provider  I discussed the limitations, risks, security and privacy concerns of performing an evaluation and management service by telephone and the availability of in person appointments. I also discussed with the patient that there may be a patient responsible charge related to this service. The patient expressed understanding and agreed to proceed.  Reason for visit: STD EXPOSURE  HPI:   28 yr old male recently married in May 2020.  One sexual partner (wife) since October 2019 presents with concern for asymptomatic STD bc his wife was treated in ER Saturday for trichomonas.  Neither partner reports any sexual activity outside of their marriage and engagement period.   2) excessive gas/flatulence.  Moves bowels every 2-3 days,  Stools are hard.  No bleeding.     ROS: See pertinent positives and negatives per HPI.  Past Medical History:  Diagnosis Date  . Gastric ulcer with obstruction April 2013   hosp [facility] , [facility]    Past Surgical History:  Procedure Laterality Date  . COSMETIC SURGERY      Family History  Problem Relation Age of Onset  . COPD Father   . Alcohol abuse Father   . Heart disease Father   . Diabetes Sister 61       gestational, not overweight   . Diabetes Maternal Grandmother   . Cancer  Maternal Grandmother        endometrial  . Early death Maternal Grandmother        cause unknown  . Heart failure Paternal Grandmother     SOCIAL HX: married   Current Outpatient Medications:  .  montelukast (SINGULAIR) 10 MG tablet, Take 1 tablet (10 mg total) by mouth at bedtime., Disp: 30 tablet, Rfl: 3  EXAM:  VITALS per patient if applicable:  GENERAL: alert, oriented, appears well and in no acute distress  HEENT: atraumatic, conjunttiva clear, no obvious abnormalities on inspection of external nose and ears  NECK: normal movements of the head and neck  LUNGS: on inspection no signs of respiratory distress, breathing rate appears normal, no obvious gross SOB, gasping or wheezing  CV: no obvious cyanosis  MS: moves all visible extremities without noticeable abnormality  PSYCH/NEURO: pleasant and cooperative, no obvious depression or anxiety, speech and thought processing grossly intact  ASSESSMENT AND PLAN:  Screen for STD (sexually transmitted disease) Requested by patient due to wife's recent diagnosis of Trichomonas.  No prior testing ,  Has not been sexually with anyone other than his wife  Flatulence ddx includes constipation , food intolerances.  Advised to try lactaid,  And beano,  Along with phasyme     I discussed the assessment and treatment plan with the patient. The patient was provided an opportunity to ask questions and all were answered. The patient agreed with the plan and demonstrated an understanding of the instructions.  The patient was advised to call back or seek an in-person evaluation if the symptoms worsen or if the condition fails to improve as anticipated.  I provided 25 minutes of non-face-to-face time during this encounter.   Crecencio Mc, MD

## 2019-06-16 ENCOUNTER — Ambulatory Visit: Payer: 59 | Admitting: Sports Medicine

## 2019-06-16 ENCOUNTER — Other Ambulatory Visit (INDEPENDENT_AMBULATORY_CARE_PROVIDER_SITE_OTHER): Payer: 59

## 2019-06-16 ENCOUNTER — Encounter: Payer: Self-pay | Admitting: Sports Medicine

## 2019-06-16 ENCOUNTER — Other Ambulatory Visit: Payer: Self-pay

## 2019-06-16 VITALS — BP 114/80 | Ht 74.0 in | Wt 152.0 lb

## 2019-06-16 DIAGNOSIS — R143 Flatulence: Secondary | ICD-10-CM | POA: Insufficient documentation

## 2019-06-16 DIAGNOSIS — R5383 Other fatigue: Secondary | ICD-10-CM

## 2019-06-16 DIAGNOSIS — Z113 Encounter for screening for infections with a predominantly sexual mode of transmission: Secondary | ICD-10-CM

## 2019-06-16 DIAGNOSIS — M25532 Pain in left wrist: Secondary | ICD-10-CM

## 2019-06-16 LAB — CBC WITH DIFFERENTIAL/PLATELET
Basophils Absolute: 0 10*3/uL (ref 0.0–0.1)
Basophils Relative: 0.7 % (ref 0.0–3.0)
Eosinophils Absolute: 0 10*3/uL (ref 0.0–0.7)
Eosinophils Relative: 1.2 % (ref 0.0–5.0)
HCT: 45.3 % (ref 39.0–52.0)
Hemoglobin: 15.3 g/dL (ref 13.0–17.0)
Lymphocytes Relative: 22.7 % (ref 12.0–46.0)
Lymphs Abs: 0.8 10*3/uL (ref 0.7–4.0)
MCHC: 33.9 g/dL (ref 30.0–36.0)
MCV: 91.8 fl (ref 78.0–100.0)
Monocytes Absolute: 0.2 10*3/uL (ref 0.1–1.0)
Monocytes Relative: 7.1 % (ref 3.0–12.0)
Neutro Abs: 2.3 10*3/uL (ref 1.4–7.7)
Neutrophils Relative %: 68.3 % (ref 43.0–77.0)
Platelets: 179 10*3/uL (ref 150.0–400.0)
RBC: 4.93 Mil/uL (ref 4.22–5.81)
RDW: 12.4 % (ref 11.5–15.5)
WBC: 3.4 10*3/uL — ABNORMAL LOW (ref 4.0–10.5)

## 2019-06-16 NOTE — Patient Instructions (Signed)
Use Voltaren Gel (over-the-counter) twice daily

## 2019-06-16 NOTE — Assessment & Plan Note (Signed)
Requested by patient due to wife's recent diagnosis of Trichomonas.  No prior testing ,  Has not been sexually with anyone other than his wife

## 2019-06-16 NOTE — Assessment & Plan Note (Signed)
ddx includes constipation , food intolerances.  Advised to try lactaid,  And beano,  Along with phasyme

## 2019-06-16 NOTE — Progress Notes (Signed)
   Subjective:    Patient ID: Christian Montgomery, male    DOB: July 12, 1991, 28 y.o.   MRN: [id number]  HPI chief complaint: Left wrist pain  Very pleasant 28 year old right-hand-dominant male comes in today complaining of 1 month of dorsal left wrist pain.  He denies any specific injury but rather describes a sudden onset of pain upon awakening one morning.  Since that time he has noticed discomfort across the dorsum of his wrist with any sort of wrist rotation.  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  He does endorse pain with axial loading such as pushing a heavy object with the palm of his hand.  He denies any recent increase in activity.  He has not noticed any swelling.  No numbness or tingling.  No weakness.  No mechanical symptoms.  He denies any significant wrist injuries in the past.  No prior wrist surgeries.  He has been placed in a cock-up wrist brace but it has not been helpful.  He has not been taking any medications.  Past medical history reviewed.  It is significant for a previous peptic ulcer Medications reviewed Allergies reviewed  Review of Systems    As above Objective:   Physical Exam  Well-developed, well-nourished.  No acute distress.  Awake alert and oriented x3.  Vital signs reviewed.  Left wrist: Full range of motion.  No obvious soft tissue swelling.  No wrist effusion.  There is reproducible pain with active flexion, extension, radial deviation, and ulnar deviation.  Patient is slightly tender over the first extensor compartment but there is no swelling here.  Negative Finkelstein's.  He is most tender to palpation in the anatomic snuffbox.  No tenderness over the scaphoid tubercle.  No tenderness at the scapholunate ligament.  No tenderness to palpation along the ulnar aspect of the wrist.  Negative Tinel's at the carpal tunnel.  Good pulses.  X-rays of the left  wrist including AP, lateral, and scaphoid views are unremarkable.  Nothing acute is seen and no significant degenerative changes. Brief bedside MSK ultrasound of the left wrist shows a slight amount of fluid in the first extensor compartment but this is equal to the amount of fluid seen in the uninvolved right first extensor compartment.  Other dorsal compartments of the left wrist are unremarkable.     Assessment & Plan:   Left wrist pain-question mild de Quervain's tenosynovitis   Thumb spica splint to be worn with activity during the day.  Patient has a history of peptic ulcer disease so we need to avoid oral NSAIDs.  I think topical Voltaren is safe for him and I recommended that he purchase some at the pharmacy and apply it twice daily.  If his symptoms do not start to improve over the next week or so, he is instructed to contact my office so that I can consider ordering an MRI.  His point of maximum tenderness is actually in the anatomic snuffbox but he denies any recent or remote injury.  Bony pathology is unlikely but if his symptoms persist, I will need to rule out AVN.  This note was dictated using Dragon naturally speaking software and may contain errors in syntax, spelling, or content which have not been identified prior to signing this note.

## 2019-06-17 ENCOUNTER — Other Ambulatory Visit: Payer: 59

## 2019-06-17 ENCOUNTER — Other Ambulatory Visit (HOSPITAL_COMMUNITY)
Admission: RE | Admit: 2019-06-17 | Discharge: 2019-06-17 | Disposition: A | Discharge status: Home / Self Care | Payer: 59 | Source: Ambulatory Visit | Attending: Internal Medicine | Admitting: Internal Medicine

## 2019-06-17 DIAGNOSIS — Z113 Encounter for screening for infections with a predominantly sexual mode of transmission: Secondary | ICD-10-CM | POA: Insufficient documentation

## 2019-06-17 LAB — HSV(HERPES SIMPLEX VRS) I + II AB-IGG
HAV 1 IGG,TYPE SPECIFIC AB: <0.9 index
HSV 2 IGG,TYPE SPECIFIC AB: <0.9 index

## 2019-06-17 LAB — HIV ANTIBODY (ROUTINE TESTING W REFLEX): HIV 1&2 Ab, 4th Generation: NONREACTIVE

## 2019-06-17 LAB — RPR: RPR Ser Ql: NONREACTIVE

## 2019-06-21 LAB — URINE CYTOLOGY ANCILLARY ONLY
Chlamydia: NEGATIVE
Neisseria Gonorrhea: NEGATIVE
Trichomonas: NEGATIVE

## 2019-06-25 LAB — URINE CYTOLOGY ANCILLARY ONLY: Bacterial vaginitis: NEGATIVE

## 2019-07-07 ENCOUNTER — Ambulatory Visit: Payer: 59 | Admitting: Sports Medicine

## 2019-07-07 ENCOUNTER — Other Ambulatory Visit: Payer: Self-pay

## 2019-07-07 VITALS — BP 98/68 | Ht 74.0 in | Wt 156.0 lb

## 2019-07-07 DIAGNOSIS — M25532 Pain in left wrist: Secondary | ICD-10-CM | POA: Diagnosis not present

## 2019-07-07 NOTE — Progress Notes (Signed)
   Subjective:    Patient ID: Christian Montgomery, male    DOB: [birth year], 28 y.o.   MRN: [id number]  HPI  Christian Montgomery comes in today for follow-up on left wrist pain.  His pain is better in the thumb spica splint but he endorses numbness along the dorsum of the thumb when wearing the splint.  Numbness resolves when he takes the splint off.  His pain returns as well.  He still endorses radial sided wrist pain with radiating pain across the dorsum of the wrist.  It is most noticeable with any sort of wrist rotation.  Topical anti-inflammatory has been slightly helpful.  Previous x-rays were unremarkable.   Review of Systems    As above Objective:   Physical Exam  Well-developed, well-nourished.  No acute distress.  Awake alert and oriented x3.  Vital signs reviewed.  Left wrist: Full range of motion.  No effusion.  No soft tissue swelling.  Patient remains tender to palpation in the anatomical snuffbox.  No tenderness over the scaphoid tubercle.  He is also tender to palpation at the radiocarpal joint dorsally.  Negative Finkelstein's.  Negative Tinel's.  Good grip strength.  Good pulses.  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  He does not recall any specific injury to the wrist but the tenderness in the anatomic snuffbox is concerning.  Previous x-rays were unremarkable but he continues to have symptoms despite thumb spica immobilization.  Therefore, I think we need to proceed with an MRI scan specifically to rule out AVN of the navicular.  Phone follow-up with those results when available.  In the meantime, I have asked him to discontinue his splint if he continues to experience numbness across the dorsum of his thumb as this is no doubt secondary to irritation of the  superficial radial nerve.

## 2019-07-26 ENCOUNTER — Ambulatory Visit: Payer: 59 | Admitting: Sports Medicine

## 2019-07-26 ENCOUNTER — Other Ambulatory Visit: Payer: Self-pay

## 2019-07-26 VITALS — BP 116/82 | Ht 74.0 in | Wt 156.0 lb

## 2019-07-26 DIAGNOSIS — M65831 Other synovitis and tenosynovitis, right forearm: Secondary | ICD-10-CM | POA: Diagnosis not present

## 2019-07-26 MED ORDER — PREDNISONE 10 MG PO TABS: ORAL_TABLET | ORAL | 0 refills | Status: DC

## 2019-07-26 NOTE — Patient Instructions (Signed)
  Good to see you today.  You have intersection syndrome of your right forearm. Take the steroids for 6 days and get some Pepcid (OTC) for stomach protection.  Ice your forearm 3x/day for 10 minutes each time  Wear your brace for one more week.  "See you" on September 8th

## 2019-07-27 ENCOUNTER — Encounter: Payer: Self-pay | Admitting: Sports Medicine

## 2019-07-27 ENCOUNTER — Other Ambulatory Visit (INDEPENDENT_AMBULATORY_CARE_PROVIDER_SITE_OTHER): Payer: 59

## 2019-07-27 ENCOUNTER — Encounter: Payer: Self-pay | Admitting: Internal Medicine

## 2019-07-27 ENCOUNTER — Other Ambulatory Visit: Payer: Self-pay

## 2019-07-27 ENCOUNTER — Telehealth (INDEPENDENT_AMBULATORY_CARE_PROVIDER_SITE_OTHER): Payer: 59 | Admitting: Internal Medicine

## 2019-07-27 VITALS — Ht 74.0 in | Wt 156.0 lb

## 2019-07-27 DIAGNOSIS — K760 Fatty (change of) liver, not elsewhere classified: Secondary | ICD-10-CM | POA: Diagnosis not present

## 2019-07-27 DIAGNOSIS — J301 Allergic rhinitis due to pollen: Secondary | ICD-10-CM | POA: Diagnosis not present

## 2019-07-27 DIAGNOSIS — Z79899 Other long term (current) drug therapy: Secondary | ICD-10-CM

## 2019-07-27 DIAGNOSIS — B351 Tinea unguium: Secondary | ICD-10-CM | POA: Diagnosis not present

## 2019-07-27 DIAGNOSIS — R143 Flatulence: Secondary | ICD-10-CM

## 2019-07-27 MED ORDER — TERBINAFINE HCL 250 MG PO TABS: 250.0000 mg | ORAL_TABLET | Freq: Every day | ORAL | 0 refills | Status: DC

## 2019-07-27 MED ORDER — MONTELUKAST SODIUM 10 MG PO TABS: 10.0000 mg | ORAL_TABLET | Freq: Every day | ORAL | 3 refills | Status: DC

## 2019-07-27 NOTE — Assessment & Plan Note (Signed)
Diagnosis of exclusion by [facility] in 2014 after comprehensive workup. Last liver enzymes were normal Occt 2019 and will be repeat this week prior to initiation of Lamasil  Lab Results  Component Value Date   ALT 15 09/15/2018   AST 15 09/15/2018   ALKPHOS 48 09/15/2018   BILITOT 3.3 (H) 09/15/2018

## 2019-07-27 NOTE — Assessment & Plan Note (Signed)
Symptoms better controlled on singulair.  Refills given

## 2019-07-27 NOTE — Assessment & Plan Note (Signed)
baseline liver enzymes are needed prior to starting lamisil and need repeat in  6 weeks

## 2019-07-27 NOTE — Assessment & Plan Note (Signed)
Improved with use of lactase, lactaid, dietary enzymes , and  Simethicone.

## 2019-07-27 NOTE — Progress Notes (Signed)
   Subjective:    Patient ID: Christian Montgomery, male    DOB: 1991/09/06, 28 y.o.   MRN: [id number]  HPI chief complaint: Right wrist pain  Dimitris comes in today complaining of right wrist pain which is been present for 1 week.  He fell striking his distal right forearm on a ramp.  Was seen at a local urgent care where x-rays were negative per his report.  He was placed into a cock-up wrist brace which is actually making his pain a little worse.  He has begun to notice some crepitus in the forearm as well.  No numbness or tingling.  Review of Systems    As above Objective:   Physical Exam  Well-developed, well-nourished.  No acute distress.  Awake alert and oriented x3.  Vital signs reviewed  Right wrist: Full range of motion.  No effusion.  There is tenderness as well as crepitus over the intersection of the first and second dorsal compartments in the distal forearm.  No tenderness to palpation over the distal radius.  There is an abrasion along the ulnar aspect of the midforearm without signs of infection.      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  6-day Sterapred Dosepak to take as directed.  Patient does have a history of gastritis so I instructed him to take Pepcid daily along with his steroids and to discontinue the prednisone if he experiences any stomach upset.  He is scheduled to undergo an MRI of his left wrist for an unrelated issue (please see previous office notes regarding this).  We have a virtual visit scheduled for September 8 to discuss those MRI findings.  I will also follow-up on this most recent injury as well.

## 2019-07-27 NOTE — Progress Notes (Signed)
Virtual Visit via [email or web address]  This visit type was conducted due to national recommendations for restrictions regarding the COVID-19 pandemic (e.g. social distancing).  This format is felt to be most appropriate for this patient at this time.  All issues noted in this document were discussed and addressed.  No physical exam was performed (except for noted visual exam findings with Video Visits).   I connected with@ on 07/27/19 at  8:30 AM EDT by a video enabled telemedicine application or telephone and verified that I am speaking with the correct person using two identifiers. Location patient: home Location provider: work or home office Persons participating in the virtual visit: patient, provider  I discussed the limitations, risks, security and privacy concerns of performing an evaluation and management service by telephone and the availability of in person appointments. I also discussed with the patient that there may be a patient responsible charge related to this service. The patient expressed understanding and agreed to proceed.  Reason for visit: follow up on flatulence,  Allergic rhinitis, and toenail fungus  HPI:  Flatulence improved with use of Lactaid , Beano and phazyme  Toenail funus:  He has  been using tea tree oil since early July. Has made very little progress,  Nails are crumbling when he picks at them.   All nails affected.  No history of diabetes .    Allergic rhinitis : symptoms resolved on singulair     ROS: See pertinent positives and negatives per HPI.  Past Medical History:  Diagnosis Date  . Gastric ulcer with obstruction April 2013   hosp [facility]    Past Surgical History:  Procedure Laterality Date  . COSMETIC SURGERY      Family History  Problem Relation Age of Onset  . COPD Father   . Alcohol abuse Father   . Heart disease Father   . Diabetes Sister 52       gestational, not overweight   . Diabetes Maternal Grandmother   . Cancer Maternal  Grandmother        endometrial  . Early death Maternal Grandmother        cause unknown  . Heart failure Paternal Grandmother     SOCIAL HX:  reports that he has never smoked. He has never used smokeless tobacco. He reports that he does not drink alcohol or use drugs.   Current Outpatient Medications:  .  montelukast (SINGULAIR) 10 MG tablet, Take 1 tablet (10 mg total) by mouth at bedtime., Disp: 90 tablet, Rfl: 3 .  predniSONE (DELTASONE) 10 MG tablet, Use as directed per doctors orders., Disp: 21 tablet, Rfl: 0 .  terbinafine (LAMISIL) 250 MG tablet, Take 1 tablet (250 mg total) by mouth daily., Disp: 90 tablet, Rfl: 0  EXAM:  VITALS per patient if applicable:  GENERAL: alert, oriented, appears well and in no acute distress  HEENT: atraumatic, conjunttiva clear, no obvious abnormalities on inspection of external nose and ears  NECK: normal movements of the head and neck  LUNGS: on inspection no signs of respiratory distress, breathing rate appears normal, no obvious gross SOB, gasping or wheezing  CV: no obvious cyanosis  MS: moves all visible extremities without noticeable abnormality Ext:  Opacification , thickening and staining of all toenails.   PSYCH/NEURO: pleasant and cooperative, no obvious depression or anxiety, speech and thought processing grossly intact  ASSESSMENT AND PLAN: Long-term use of high-risk medication baseline liver enzymes are needed prior to starting lamisil and need repeat in  6 weeks    Nonalcoholic fatty liver disease without nonalcoholic steatohepatitis (NASH) Diagnosis of exclusion by [facility] in 2014 after comprehensive workup. Last liver enzymes were normal Occt 2019 and will be repeat this week prior to initiation of Lamasil  Lab Results  Component Value Date   ALT 15 09/15/2018   AST 15 09/15/2018   ALKPHOS 48 09/15/2018   BILITOT 3.3 (H) 09/15/2018     Allergic rhinitis Symptoms better controlled on singulair.  Refills  given   Flatulence Improved with use of lactase, lactaid, dietary enzymes , and  Simethicone.     I discussed the assessment and treatment plan with the patient. The patient was provided an opportunity to ask questions and all were answered. The patient agreed with the plan and demonstrated an understanding of the instructions.   The patient was advised to call back or seek an in-person evaluation if the symptoms worsen or if the condition fails to improve as anticipated.  I provided 25 minutes of non-face-to-face time during this encounter.   Sherlene Shamseresa L Dary Dilauro, MD

## 2019-07-28 ENCOUNTER — Other Ambulatory Visit: Payer: Self-pay | Admitting: Internal Medicine

## 2019-07-28 DIAGNOSIS — Z79899 Other long term (current) drug therapy: Secondary | ICD-10-CM

## 2019-07-28 LAB — COMPREHENSIVE METABOLIC PANEL
ALT: 22 U/L (ref 0–53)
AST: 18 U/L (ref 0–37)
Albumin: 5.1 g/dL (ref 3.5–5.2)
Alkaline Phosphatase: 50 U/L (ref 39–117)
BUN: 14 mg/dL (ref 6–23)
CO2: 27 mEq/L (ref 19–32)
Calcium: 9.5 mg/dL (ref 8.4–10.5)
Chloride: 101 mEq/L (ref 96–112)
Creatinine, Ser: 1.23 mg/dL (ref 0.40–1.50)
GFR: 68.8 mL/min (ref >60.00)
Glucose, Bld: 128 mg/dL — ABNORMAL HIGH (ref 70–99)
Potassium: 4.3 mEq/L (ref 3.5–5.1)
Sodium: 138 mEq/L (ref 135–145)
Total Bilirubin: 2.2 mg/dL — ABNORMAL HIGH (ref 0.2–1.2)
Total Protein: 7.2 g/dL (ref 6.0–8.3)

## 2019-08-04 ENCOUNTER — Ambulatory Visit
Admission: RE | Admit: 2019-08-04 | Discharge: 2019-08-04 | Disposition: A | Discharge status: Home / Self Care | Payer: 59 | Source: Ambulatory Visit | Attending: Sports Medicine | Admitting: Sports Medicine

## 2019-08-04 ENCOUNTER — Other Ambulatory Visit: Payer: Self-pay

## 2019-08-04 DIAGNOSIS — M25532 Pain in left wrist: Secondary | ICD-10-CM

## 2019-08-09 ENCOUNTER — Other Ambulatory Visit: Payer: Self-pay

## 2019-08-09 ENCOUNTER — Ambulatory Visit (INDEPENDENT_AMBULATORY_CARE_PROVIDER_SITE_OTHER): Payer: 59 | Admitting: Sports Medicine

## 2019-08-09 VITALS — Ht 74.0 in | Wt 156.0 lb

## 2019-08-09 DIAGNOSIS — M25532 Pain in left wrist: Secondary | ICD-10-CM

## 2019-08-10 NOTE — Progress Notes (Signed)
  This encounter was provided via telemedicine Consent was obtained Patient was located in [place] (on vacation) Provider was located in his office  I followed up with Christian Montgomery today to discuss MRI findings of his left wrist.  MRI is unremarkable.  He still experiencing a little bit of stiffness in the wrist which may be secondary to immobilization in the brace.  I have asked him to discontinue the brace altogether.  He is asking about possible rheumatological work-up and I think that is reasonable if his stiffness does not improve over the next couple of weeks.  His right wrist is feeling much better after a 6-day Sterapred Dosepak. At this point I see no reason that he should have limitations on his activity.  He is reassured of his MRI findings.  He will follow-up with me as needed.  Total time spent with the patient was 10 minutes of median intra-service time

## 2019-08-16 ENCOUNTER — Ambulatory Visit: Payer: 59 | Admitting: Sports Medicine

## 2019-08-17 ENCOUNTER — Other Ambulatory Visit: Payer: Self-pay

## 2019-08-17 ENCOUNTER — Telehealth: Payer: Self-pay | Admitting: Internal Medicine

## 2019-08-17 ENCOUNTER — Other Ambulatory Visit (INDEPENDENT_AMBULATORY_CARE_PROVIDER_SITE_OTHER): Payer: 59

## 2019-08-17 DIAGNOSIS — Z79899 Other long term (current) drug therapy: Secondary | ICD-10-CM

## 2019-08-17 LAB — COMPREHENSIVE METABOLIC PANEL
ALT: 18 U/L (ref 0–53)
AST: 17 U/L (ref 0–37)
Albumin: 4.5 g/dL (ref 3.5–5.2)
Alkaline Phosphatase: 57 U/L (ref 39–117)
BUN: 9 mg/dL (ref 6–23)
CO2: 28 mEq/L (ref 19–32)
Calcium: 9.4 mg/dL (ref 8.4–10.5)
Chloride: 103 mEq/L (ref 96–112)
Creatinine, Ser: 0.99 mg/dL (ref 0.40–1.50)
GFR: 88.36 mL/min (ref >60.00)
Glucose, Bld: 82 mg/dL (ref 70–99)
Potassium: 4 mEq/L (ref 3.5–5.1)
Sodium: 138 mEq/L (ref 135–145)
Total Bilirubin: 1.9 mg/dL — ABNORMAL HIGH (ref 0.2–1.2)
Total Protein: 6.7 g/dL (ref 6.0–8.3)

## 2019-08-17 NOTE — Telephone Encounter (Signed)
Ordered

## 2019-08-17 NOTE — Telephone Encounter (Signed)
Pt wanted to know if he need to make a lab appt in another 3 weeks. Pt thinks he is getting labs every 3 weeks for the medication he is on, no lab orders for the next 3 weeks out.

## 2019-08-18 NOTE — Telephone Encounter (Signed)
Can you schedule pt for a repeat lab visit in 3 weeks? Non fasting.

## 2019-09-07 ENCOUNTER — Other Ambulatory Visit (INDEPENDENT_AMBULATORY_CARE_PROVIDER_SITE_OTHER): Payer: 59

## 2019-09-07 ENCOUNTER — Other Ambulatory Visit: Payer: Self-pay

## 2019-09-07 DIAGNOSIS — Z79899 Other long term (current) drug therapy: Secondary | ICD-10-CM | POA: Diagnosis not present

## 2019-09-07 LAB — COMPREHENSIVE METABOLIC PANEL
ALT: 25 U/L (ref 0–53)
AST: 22 U/L (ref 0–37)
Albumin: 4.7 g/dL (ref 3.5–5.2)
Alkaline Phosphatase: 57 U/L (ref 39–117)
BUN: 9 mg/dL (ref 6–23)
CO2: 31 mEq/L (ref 19–32)
Calcium: 9.5 mg/dL (ref 8.4–10.5)
Chloride: 100 mEq/L (ref 96–112)
Creatinine, Ser: 1.01 mg/dL (ref 0.40–1.50)
GFR: 86.31 mL/min (ref >60.00)
Glucose, Bld: 95 mg/dL (ref 70–99)
Potassium: 3.9 mEq/L (ref 3.5–5.1)
Sodium: 139 mEq/L (ref 135–145)
Total Bilirubin: 1.8 mg/dL — ABNORMAL HIGH (ref 0.2–1.2)
Total Protein: 6.7 g/dL (ref 6.0–8.3)

## 2019-09-26 ENCOUNTER — Telehealth: Payer: Self-pay | Admitting: *Deleted

## 2019-09-26 DIAGNOSIS — Z79899 Other long term (current) drug therapy: Secondary | ICD-10-CM

## 2019-09-26 NOTE — Telephone Encounter (Signed)
Please place future orders for lab appt.  

## 2019-09-28 ENCOUNTER — Other Ambulatory Visit (INDEPENDENT_AMBULATORY_CARE_PROVIDER_SITE_OTHER): Payer: 59

## 2019-09-28 ENCOUNTER — Other Ambulatory Visit: Payer: Self-pay

## 2019-09-28 DIAGNOSIS — Z79899 Other long term (current) drug therapy: Secondary | ICD-10-CM

## 2019-09-28 LAB — COMPREHENSIVE METABOLIC PANEL
ALT: 17 U/L (ref 0–53)
AST: 19 U/L (ref 0–37)
Albumin: 4.6 g/dL (ref 3.5–5.2)
Alkaline Phosphatase: 52 U/L (ref 39–117)
BUN: 9 mg/dL (ref 6–23)
CO2: 31 mEq/L (ref 19–32)
Calcium: 9.1 mg/dL (ref 8.4–10.5)
Chloride: 103 mEq/L (ref 96–112)
Creatinine, Ser: 1.04 mg/dL (ref 0.40–1.50)
GFR: 83.41 mL/min (ref >60.00)
Glucose, Bld: 91 mg/dL (ref 70–99)
Potassium: 3.8 mEq/L (ref 3.5–5.1)
Sodium: 139 mEq/L (ref 135–145)
Total Bilirubin: 1.7 mg/dL — ABNORMAL HIGH (ref 0.2–1.2)
Total Protein: 6.6 g/dL (ref 6.0–8.3)

## 2019-10-18 ENCOUNTER — Telehealth: Payer: Self-pay | Admitting: *Deleted

## 2019-10-18 ENCOUNTER — Other Ambulatory Visit: Payer: Self-pay

## 2019-10-18 DIAGNOSIS — K760 Fatty (change of) liver, not elsewhere classified: Secondary | ICD-10-CM

## 2019-10-18 MED ORDER — TERBINAFINE HCL 250 MG PO TABS: 250.0000 mg | ORAL_TABLET | Freq: Every day | ORAL | 0 refills | Status: DC

## 2019-10-18 NOTE — Telephone Encounter (Signed)
Please place future orders for lab appt.  

## 2019-10-18 NOTE — Telephone Encounter (Signed)
Refilled: 07/27/2019 Last OV: 07/27/2019 Next OV: not scheduled

## 2019-10-19 ENCOUNTER — Other Ambulatory Visit: Payer: Self-pay

## 2019-10-19 ENCOUNTER — Other Ambulatory Visit (INDEPENDENT_AMBULATORY_CARE_PROVIDER_SITE_OTHER): Payer: 59

## 2019-10-19 DIAGNOSIS — K760 Fatty (change of) liver, not elsewhere classified: Secondary | ICD-10-CM

## 2019-10-19 LAB — COMPREHENSIVE METABOLIC PANEL
ALT: 21 U/L (ref 0–53)
AST: 18 U/L (ref 0–37)
Albumin: 4.8 g/dL (ref 3.5–5.2)
Alkaline Phosphatase: 58 U/L (ref 39–117)
BUN: 8 mg/dL (ref 6–23)
CO2: 33 mEq/L — ABNORMAL HIGH (ref 19–32)
Calcium: 9.5 mg/dL (ref 8.4–10.5)
Chloride: 99 mEq/L (ref 96–112)
Creatinine, Ser: 0.94 mg/dL (ref 0.40–1.50)
GFR: 93.69 mL/min (ref >60.00)
Glucose, Bld: 88 mg/dL (ref 70–99)
Potassium: 4 mEq/L (ref 3.5–5.1)
Sodium: 139 mEq/L (ref 135–145)
Total Bilirubin: 1.9 mg/dL — ABNORMAL HIGH (ref 0.2–1.2)
Total Protein: 6.7 g/dL (ref 6.0–8.3)

## 2019-10-21 ENCOUNTER — Encounter: Payer: Self-pay | Admitting: Internal Medicine

## 2019-10-21 ENCOUNTER — Other Ambulatory Visit: Payer: Self-pay

## 2019-10-21 ENCOUNTER — Ambulatory Visit (INDEPENDENT_AMBULATORY_CARE_PROVIDER_SITE_OTHER): Payer: 59 | Admitting: Internal Medicine

## 2019-10-21 ENCOUNTER — Telehealth: Payer: Self-pay | Admitting: Internal Medicine

## 2019-10-21 DIAGNOSIS — K760 Fatty (change of) liver, not elsewhere classified: Secondary | ICD-10-CM

## 2019-10-21 DIAGNOSIS — F4321 Adjustment disorder with depressed mood: Secondary | ICD-10-CM

## 2019-10-21 DIAGNOSIS — B351 Tinea unguium: Secondary | ICD-10-CM | POA: Diagnosis not present

## 2019-10-21 NOTE — Assessment & Plan Note (Signed)
Continue lamisil po for another 3 months ,  Continue monthly LFTs

## 2019-10-21 NOTE — Progress Notes (Signed)
Virtual Visit via [email or web address]  This visit type was conducted due to national recommendations for restrictions regarding the COVID-19 pandemic (e.g. social distancing).  This format is felt to be most appropriate for this patient at this time.  All issues noted in this document were discussed and addressed.  No physical exam was performed (except for noted visual exam findings with Video Visits).   I connected with@ on 10/21/19 at 10:30 AM EST by a video enabled telemedicine application and verified that I am speaking with the correct person using two identifiers. Location patient: home Location provider: work or home office Persons participating in the virtual visit: patient, provider  I discussed the limitations, risks, security and privacy concerns of performing an evaluation and management service by telephone and the availability of in person appointments. I also discussed with the patient that there may be a patient responsible charge related to this service. The patient expressed understanding and agreed to proceed.  Reason for visit: follow up on toenail fungus   HPI:  28 yr old male with NASH  Undergoing treatment for recurrent  toenail fungus since late August with lamisil.  LFTS have been monitored every 3-4 weeks and have been normal or stable.   His nails have improved  And he is having no side effects that he attributes to the medication.  Grief:  His 57 yr old sister died unexpecetedly after a week of nausea and vomiting.  She was a type 2 diabetic,  Refused to seek help.  Autopsy was done and they were told that "hear heart stopped."  He initially had insomnia,  But is sleeping better most nights,  Averaging 6 hours .  Appetite has been poor.    ROS: See pertinent positives and negatives per HPI.  Past Medical History:  Diagnosis Date  . Gastric ulcer with obstruction April 2013   hosp [facility]    Past Surgical History:  Procedure Laterality Date  . COSMETIC SURGERY       Family History  Problem Relation Age of Onset  . COPD Father   . Alcohol abuse Father   . Heart disease Father   . Diabetes Sister 16       gestational, not overweight   . Diabetes Maternal Grandmother   . Cancer Maternal Grandmother        endometrial  . Early death Maternal Grandmother        cause unknown  . Heart failure Paternal Grandmother     SOCIAL HX:  reports that he has never smoked. He has never used smokeless tobacco. He reports that he does not drink alcohol or use drugs.   Current Outpatient Medications:  .  montelukast (SINGULAIR) 10 MG tablet, Take 1 tablet (10 mg total) by mouth at bedtime., Disp: 90 tablet, Rfl: 3 .  terbinafine (LAMISIL) 250 MG tablet, Take 1 tablet (250 mg total) by mouth daily., Disp: 90 tablet, Rfl: 0  EXAM:  VITALS per patient if applicable:  GENERAL: alert, oriented, appears well and in no acute distress  HEENT: atraumatic, conjunttiva clear, no obvious abnormalities on inspection of external nose and ears  NECK: normal movements of the head and neck  LUNGS: on inspection no signs of respiratory distress, breathing rate appears normal, no obvious gross SOB, gasping or wheezing  CV: no obvious cyanosis  MS: moves all visible extremities without noticeable abnormality  PSYCH/NEURO: pleasant and cooperative, no obvious depression or anxiety, speech and thought processing grossly intact  Ext:  Toenails all with new proximal growth of 1-2 mm   ASSESSMENT AND PLAN:  Discussed the following assessment and plan:  Nonalcoholic fatty liver disease without nonalcoholic steatohepatitis (NASH)  Onychomycosis of toenail  Grief  Nonalcoholic fatty liver disease without nonalcoholic steatohepatitis (NASH) LFTs normal  Except for mild stable elevation of T Bili  No significant change since starting lamisil.  Recheck in 4 weeks   Lab Results  Component Value Date   ALT 21 10/19/2019   AST 18 10/19/2019   ALKPHOS 58 10/19/2019    BILITOT 1.9 (H) 10/19/2019     Onychomycosis of toenail Continue lamisil po for another 3 months ,  Continue monthly LFTs  Grief Patient is dealing with the unexpected loss of his older sister nd has adequate coping skills and emotional support .  i have  Given patient a list of grief counsellors     I discussed the assessment and treatment plan with the patient. The patient was provided an opportunity to ask questions and all were answered. The patient agreed with the plan and demonstrated an understanding of the instructions.   The patient was advised to call back or seek an in-person evaluation if the symptoms worsen or if the condition fails to improve as anticipated.  I provided 25 minutes of non-face-to-face time during this encounter.   Sherlene Shams, MD

## 2019-10-21 NOTE — Assessment & Plan Note (Signed)
LFTs normal  Except for mild stable elevation of T Bili  No significant change since starting lamisil.  Recheck in 4 weeks   Lab Results  Component Value Date   ALT 21 10/19/2019   AST 18 10/19/2019   ALKPHOS 58 10/19/2019   BILITOT 1.9 (H) 10/19/2019

## 2019-10-21 NOTE — Assessment & Plan Note (Signed)
Patient is dealing with the unexpected loss of his older sister nd has adequate coping skills and emotional support .  i have  Given patient a list of grief counsellors

## 2019-11-09 ENCOUNTER — Other Ambulatory Visit: Payer: 59

## 2019-11-17 ENCOUNTER — Telehealth: Payer: Self-pay | Admitting: Internal Medicine

## 2019-11-17 DIAGNOSIS — Z79899 Other long term (current) drug therapy: Secondary | ICD-10-CM

## 2019-11-17 NOTE — Telephone Encounter (Signed)
Not sure what labs are needed 

## 2019-11-17 NOTE — Addendum Note (Signed)
Addended by: Crecencio Mc on: 11/17/2019 06:07 PM   Modules accepted: Orders

## 2019-11-17 NOTE — Telephone Encounter (Signed)
Pt called to resch lab appt. No orders in chart. Order is needed please and Thank you! Pt is rescheduled for 12/13/2019. Please and thank you!

## 2019-11-29 ENCOUNTER — Other Ambulatory Visit: Payer: 59

## 2019-12-08 ENCOUNTER — Other Ambulatory Visit: Payer: Self-pay

## 2019-12-13 ENCOUNTER — Other Ambulatory Visit (INDEPENDENT_AMBULATORY_CARE_PROVIDER_SITE_OTHER): Payer: 59

## 2019-12-13 ENCOUNTER — Other Ambulatory Visit: Payer: Self-pay

## 2019-12-13 DIAGNOSIS — Z79899 Other long term (current) drug therapy: Secondary | ICD-10-CM | POA: Diagnosis not present

## 2019-12-13 LAB — COMPREHENSIVE METABOLIC PANEL
ALT: 14 U/L (ref 0–53)
AST: 15 U/L (ref 0–37)
Albumin: 4.6 g/dL (ref 3.5–5.2)
Alkaline Phosphatase: 51 U/L (ref 39–117)
BUN: 12 mg/dL (ref 6–23)
CO2: 32 mEq/L (ref 19–32)
Calcium: 9.2 mg/dL (ref 8.4–10.5)
Chloride: 101 mEq/L (ref 96–112)
Creatinine, Ser: 0.97 mg/dL (ref 0.40–1.50)
GFR: 90.27 mL/min (ref >60.00)
Glucose, Bld: 115 mg/dL — ABNORMAL HIGH (ref 70–99)
Potassium: 3.9 mEq/L (ref 3.5–5.1)
Sodium: 139 mEq/L (ref 135–145)
Total Bilirubin: 2.1 mg/dL — ABNORMAL HIGH (ref 0.2–1.2)
Total Protein: 6.6 g/dL (ref 6.0–8.3)

## 2019-12-26 ENCOUNTER — Ambulatory Visit: Payer: 59 | Admitting: Internal Medicine

## 2019-12-28 ENCOUNTER — Ambulatory Visit: Payer: 59 | Admitting: Internal Medicine

## 2020-01-04 ENCOUNTER — Other Ambulatory Visit: Payer: Self-pay

## 2020-01-04 ENCOUNTER — Encounter: Payer: Self-pay | Admitting: Internal Medicine

## 2020-01-04 ENCOUNTER — Ambulatory Visit (INDEPENDENT_AMBULATORY_CARE_PROVIDER_SITE_OTHER): Payer: 59 | Admitting: Internal Medicine

## 2020-01-04 VITALS — HR 126 | Ht 74.0 in | Wt 156.0 lb

## 2020-01-04 DIAGNOSIS — R000 Tachycardia, unspecified: Secondary | ICD-10-CM | POA: Diagnosis not present

## 2020-01-04 DIAGNOSIS — F430 Acute stress reaction: Secondary | ICD-10-CM

## 2020-01-04 DIAGNOSIS — Z8719 Personal history of other diseases of the digestive system: Secondary | ICD-10-CM | POA: Diagnosis not present

## 2020-01-04 DIAGNOSIS — Z8711 Personal history of peptic ulcer disease: Secondary | ICD-10-CM

## 2020-01-04 DIAGNOSIS — K760 Fatty (change of) liver, not elsewhere classified: Secondary | ICD-10-CM | POA: Diagnosis not present

## 2020-01-04 DIAGNOSIS — B351 Tinea unguium: Secondary | ICD-10-CM

## 2020-01-04 DIAGNOSIS — F411 Generalized anxiety disorder: Secondary | ICD-10-CM

## 2020-01-04 MED ORDER — METOPROLOL SUCCINATE ER 25 MG PO TB24: 25.0000 mg | ORAL_TABLET | Freq: Every day | ORAL | 3 refills | Status: DC

## 2020-01-04 MED ORDER — OMEPRAZOLE 40 MG PO CPDR: 40.0000 mg | DELAYED_RELEASE_CAPSULE | Freq: Every day | ORAL | 3 refills | Status: DC

## 2020-01-04 MED ORDER — PAROXETINE HCL 10 MG PO TABS: 10.0000 mg | ORAL_TABLET | Freq: Every day | ORAL | 5 refills | Status: DC

## 2020-01-04 NOTE — Progress Notes (Signed)
Virtual Visit via [email or web address]  This visit type was conducted due to national recommendations for restrictions regarding the COVID-19 pandemic (e.g. social distancing).  This format is felt to be most appropriate for this patient at this time.  All issues noted in this document were discussed and addressed.  No physical exam was performed (except for noted visual exam findings with Video Visits).   I connected with@ on 01/04/20 at  2:30 PM EST by a video enabled telemedicine application  and verified that I am speaking with the correct person using two identifiers. Location patient: home Location provider: work or home office Persons participating in the virtual visit: patient, provider  I discussed the limitations, risks, security and privacy concerns of performing an evaluation and management service by telephone and the availability of in person appointments. I also discussed with the patient that there may be a patient responsible charge related to this service. The patient expressed understanding and agreed to proceed.   Reason for visit: anxiety,  Panic attacks   HPI:  29 yr old male with history of gastric ulcer with outlet obstruction in 2013 presents with anorexia, recurrent episodes of panic accompanied by tachycardia .  Symptoms started after his 108 yr old sister was found dead in her home by her husband.  Patient has been worrying excessively about his mother who has been grieving heavily.  Gets panicked if he calls her and she does not answer.  Heart starts racing and at times feels irregular .  denies use os stimulants, alcohol and caffeine. Playing computer games to relax    ROS: See pertinent positives and negatives per HPI.  Past Medical History:  Diagnosis Date  . Gastric ulcer with obstruction April 2013   hosp [facility]    Past Surgical History:  Procedure Laterality Date  . COSMETIC SURGERY      Family History  Problem Relation Age of Onset  . COPD Father    . Alcohol abuse Father   . Heart disease Father   . Diabetes Sister 40       gestational, not overweight   . Diabetes Maternal Grandmother   . Cancer Maternal Grandmother        endometrial  . Early death Maternal Grandmother        cause unknown  . Heart failure Paternal Grandmother     SOCIAL HX:  reports that he has never smoked. He has never used smokeless tobacco. He reports that he does not drink alcohol or use drugs.   Current Outpatient Medications:  .  montelukast (SINGULAIR) 10 MG tablet, Take 1 tablet (10 mg total) by mouth at bedtime., Disp: 90 tablet, Rfl: 3 .  terbinafine (LAMISIL) 250 MG tablet, Take 1 tablet (250 mg total) by mouth daily., Disp: 90 tablet, Rfl: 0 .  metoprolol succinate (TOPROL-XL) 25 MG 24 hr tablet, Take 1 tablet (25 mg total) by mouth daily., Disp: 90 tablet, Rfl: 3 .  omeprazole (PRILOSEC) 40 MG capsule, Take 1 capsule (40 mg total) by mouth daily., Disp: 30 capsule, Rfl: 3 .  PARoxetine (PAXIL) 10 MG tablet, Take 1 tablet (10 mg total) by mouth daily with supper., Disp: 30 tablet, Rfl: 5  EXAM:  VITALS per patient if applicable:  GENERAL: alert, oriented, appears well and in no acute distress  HEENT: atraumatic, conjunttiva clear, no obvious abnormalities on inspection of external nose and ears  NECK: normal movements of the head and neck  LUNGS: on inspection no signs of  respiratory distress, breathing rate appears normal, no obvious gross SOB, gasping or wheezing  CV: no obvious cyanosis  MS: moves all visible extremities without noticeable abnormality  PSYCH/NEURO: pleasant and cooperative, no obvious depression or anxiety, speech and thought processing grossly intact  ASSESSMENT AND PLAN:  Discussed the following assessment and plan:  Tachycardia  Onychomycosis of toenail  Personal history of gastric ulcer  Anxiety as acute reaction to exceptional stress  Tachycardia Resting pulse is 126 currently. Not clear if this is  due to high anxiety state or contributing to the HA state.  Starting toprol xl 25 mg daily   Onychomycosis of toenail Treated with terbinafine since August 2020.  Resolved except for extreme distal end of right great toe .  Stopping medication   Personal history of gastric ulcer Resuming omeprazole 40 mg daily given new onset anorexia x 3 days  Anxiety as acute reaction to exceptional stress Starting paxil 10 mg daily.  RTC 3 weeks     I discussed the assessment and treatment plan with the patient. The patient was provided an opportunity to ask questions and all were answered. The patient agreed with the plan and demonstrated an understanding of the instructions.   The patient was advised to call back or seek an in-person evaluation if the symptoms worsen or if the condition fails to improve as anticipated.   I provided 30 minutes of non-face-to-face time during this encounter reviewing patient's current problems and past procedures/imaging studies, providing counseling on the above mentioned problems , and coordination  of care .  Crecencio Mc, MD

## 2020-01-04 NOTE — Patient Instructions (Signed)
We are starting Paxil (generic,  Paroxetine) for your anxiety. Start with 1/2 tablet daily after supper for the first few days  To avoid nausea. Increase to a full tablet after 4 days.  Starting you on Toprol XL to slow down your heart rate.  Take it with the paxil in the evening STARTING TONIGHT   We are restarting omeprazole for the stomach.  Take it in the morning on an empty stomach,  30 minutes prior to eating

## 2020-01-04 NOTE — Assessment & Plan Note (Signed)
Treated with terbinafine since August 2020.  Resolved except for extreme distal end of right great toe .  Stopping medication

## 2020-01-04 NOTE — Progress Notes (Signed)
Occurred after sister passed away in 07-Sep-2019 but he has noticed it getting worse over the last several days.

## 2020-01-04 NOTE — Assessment & Plan Note (Signed)
Resting pulse is 126 currently. Not clear if this is due to high anxiety state or contributing to the HA state.  Starting toprol xl 25 mg daily

## 2020-01-04 NOTE — Assessment & Plan Note (Signed)
Starting paxil 10 mg daily.  RTC 3 weeks

## 2020-01-04 NOTE — Assessment & Plan Note (Signed)
Resuming omeprazole 40 mg daily given new onset anorexia x 3 days

## 2020-01-16 ENCOUNTER — Encounter: Payer: Self-pay | Admitting: Internal Medicine

## 2020-01-16 NOTE — Progress Notes (Signed)
Submitted PA for Omeprazole through CoverMyMeds today 01/16/20

## 2020-01-19 ENCOUNTER — Other Ambulatory Visit: Payer: 59

## 2020-01-23 ENCOUNTER — Other Ambulatory Visit: Payer: Self-pay

## 2020-01-23 ENCOUNTER — Other Ambulatory Visit: Payer: 59

## 2020-01-23 ENCOUNTER — Other Ambulatory Visit (INDEPENDENT_AMBULATORY_CARE_PROVIDER_SITE_OTHER): Payer: 59

## 2020-01-23 ENCOUNTER — Ambulatory Visit: Payer: 59 | Admitting: Internal Medicine

## 2020-01-23 DIAGNOSIS — R000 Tachycardia, unspecified: Secondary | ICD-10-CM

## 2020-01-23 DIAGNOSIS — K760 Fatty (change of) liver, not elsewhere classified: Secondary | ICD-10-CM

## 2020-01-23 LAB — CBC WITH DIFFERENTIAL/PLATELET
Basophils Absolute: 0 10*3/uL (ref 0.0–0.1)
Basophils Relative: 1 % (ref 0.0–3.0)
Eosinophils Absolute: 0.1 10*3/uL (ref 0.0–0.7)
Eosinophils Relative: 1.9 % (ref 0.0–5.0)
HCT: 46.4 % (ref 39.0–52.0)
Hemoglobin: 15.6 g/dL (ref 13.0–17.0)
Lymphocytes Relative: 26.1 % (ref 12.0–46.0)
Lymphs Abs: 1 10*3/uL (ref 0.7–4.0)
MCHC: 33.5 g/dL (ref 30.0–36.0)
MCV: 92.9 fl (ref 78.0–100.0)
Monocytes Absolute: 0.3 10*3/uL (ref 0.1–1.0)
Monocytes Relative: 8.1 % (ref 3.0–12.0)
Neutro Abs: 2.3 10*3/uL (ref 1.4–7.7)
Neutrophils Relative %: 62.9 % (ref 43.0–77.0)
Platelets: 168 10*3/uL (ref 150.0–400.0)
RBC: 5 Mil/uL (ref 4.22–5.81)
RDW: 12.5 % (ref 11.5–15.5)
WBC: 3.7 10*3/uL — ABNORMAL LOW (ref 4.0–10.5)

## 2020-01-23 LAB — COMPREHENSIVE METABOLIC PANEL
ALT: 12 U/L (ref 0–53)
AST: 13 U/L (ref 0–37)
Albumin: 4.7 g/dL (ref 3.5–5.2)
Alkaline Phosphatase: 56 U/L (ref 39–117)
BUN: 14 mg/dL (ref 6–23)
CO2: 30 mEq/L (ref 19–32)
Calcium: 9.4 mg/dL (ref 8.4–10.5)
Chloride: 102 mEq/L (ref 96–112)
Creatinine, Ser: 1.13 mg/dL (ref 0.40–1.50)
GFR: 75.63 mL/min (ref >60.00)
Glucose, Bld: 102 mg/dL — ABNORMAL HIGH (ref 70–99)
Potassium: 4.4 mEq/L (ref 3.5–5.1)
Sodium: 140 mEq/L (ref 135–145)
Total Bilirubin: 1.9 mg/dL — ABNORMAL HIGH (ref 0.2–1.2)
Total Protein: 6.9 g/dL (ref 6.0–8.3)

## 2020-01-23 LAB — LIPID PANEL
Cholesterol: 113 mg/dL (ref 0–200)
HDL: 49.7 mg/dL (ref >39.00)
LDL Cholesterol: 52 mg/dL (ref 0–99)
NonHDL: 63.58
Total CHOL/HDL Ratio: 2
Triglycerides: 56 mg/dL (ref 0.0–149.0)
VLDL: 11.2 mg/dL (ref 0.0–40.0)

## 2020-01-23 LAB — TSH: TSH: 2.1 u[IU]/mL (ref 0.35–4.50)

## 2020-01-23 LAB — MAGNESIUM: Magnesium: 2.1 mg/dL (ref 1.5–2.5)

## 2020-01-24 MED ORDER — PANTOPRAZOLE SODIUM 40 MG PO TBEC: 40.0000 mg | DELAYED_RELEASE_TABLET | Freq: Every day | ORAL | 3 refills | Status: DC

## 2020-01-25 ENCOUNTER — Ambulatory Visit: Payer: 59 | Admitting: Internal Medicine

## 2020-01-26 ENCOUNTER — Telehealth: Payer: Self-pay | Admitting: Internal Medicine

## 2020-01-26 NOTE — Telephone Encounter (Signed)
I have rescheduled pt for the first available in office appt on 02/01/2020 at 11:30am.

## 2020-01-26 NOTE — Telephone Encounter (Signed)
Patient called about his appointment that was canceled on 01/23/20. Patient was informed that office is currently working on it and will call him back. Where can we schedule?

## 2020-01-30 ENCOUNTER — Telehealth: Payer: Self-pay

## 2020-01-30 NOTE — Telephone Encounter (Signed)
PA for pantoprazole has been submitted on covermymeds.  

## 2020-01-31 NOTE — Telephone Encounter (Signed)
PA approved through 01/30/2021.

## 2020-02-01 ENCOUNTER — Other Ambulatory Visit: Payer: Self-pay

## 2020-02-01 ENCOUNTER — Ambulatory Visit (INDEPENDENT_AMBULATORY_CARE_PROVIDER_SITE_OTHER): Payer: 59 | Admitting: Internal Medicine

## 2020-02-01 ENCOUNTER — Encounter: Payer: Self-pay | Admitting: Internal Medicine

## 2020-02-01 VITALS — BP 104/66 | HR 62 | Temp 97.0°F | Resp 14 | Ht 74.0 in | Wt 159.0 lb

## 2020-02-01 DIAGNOSIS — M25511 Pain in right shoulder: Secondary | ICD-10-CM

## 2020-02-01 DIAGNOSIS — Z7189 Other specified counseling: Secondary | ICD-10-CM | POA: Diagnosis not present

## 2020-02-01 DIAGNOSIS — F411 Generalized anxiety disorder: Secondary | ICD-10-CM

## 2020-02-01 DIAGNOSIS — K760 Fatty (change of) liver, not elsewhere classified: Secondary | ICD-10-CM | POA: Diagnosis not present

## 2020-02-01 DIAGNOSIS — R000 Tachycardia, unspecified: Secondary | ICD-10-CM

## 2020-02-01 DIAGNOSIS — K582 Mixed irritable bowel syndrome: Secondary | ICD-10-CM

## 2020-02-01 DIAGNOSIS — J301 Allergic rhinitis due to pollen: Secondary | ICD-10-CM

## 2020-02-01 DIAGNOSIS — F430 Acute stress reaction: Secondary | ICD-10-CM

## 2020-02-01 MED ORDER — DICYCLOMINE HCL 10 MG PO CAPS: 10.0000 mg | ORAL_CAPSULE | Freq: Three times a day (TID) | ORAL | 1 refills | Status: DC

## 2020-02-01 NOTE — Progress Notes (Signed)
Subjective:  Patient ID: Christian Montgomery, male    DOB: [birth year]  Age: 29 y.o. MRN: [id number]  CC: The primary encounter diagnosis was Tachycardia. Diagnoses of Anxiety as acute reaction to exceptional stress, Grief counseling, Nonalcoholic fatty liver disease without nonalcoholic steatohepatitis (NASH), Irritable bowel syndrome with both constipation and diarrhea, Seasonal allergic rhinitis due to pollen, and Arthralgia of right acromioclavicular joint were also pertinent to this visit.  HPI DEMONI GERGEN presents for follow up on multiple issues.    This visit occurred during the SARS-CoV-2 public health emergency.  Safety protocols were in place, including screening questions prior to the visit, additional usage of staff PPE, and extensive cleaning of exam room while observing appropriate contact time as indicated for disinfecting solutions.    1) follow up on  of GAD with frequent panic attacks brought on by the unexpected sudden death of his 14 yr old sister on Sept 16.  The circumstances surrounding her death were reviewed with patient,  And suggest that she developed DKA and died at home.  He feels considerable guilt, which we discussed today  .  He was prescribed paxil 10 mg daily along with metoprolol 25 mg daily for the episodes of  recurrent tachycardia . Episodes of panic with tachycardia have not recurred, and he is tolerating both medications without side effects.  He feels better when he is at work because he is too busy to think about her and grieve.  HOWEVER, HE FINDS HIMSELF worrying about his mother and panics if he doesn't reach her by phone on a daily basis.  He is receptive to the  idea of grief counselling  2) gastritis/IBS:  His symptoms have Improved with daily use of protonix.  Still having IBS symptoms of bilateral Lower quadrant cramping after certain meals (salads,  Pasta with red sauce) followed by stool within 30 mintues but stools are solid.  Tried hyoscyamine  Did not  help with cramping .   Trying beano   3) right AC joint pain, has been present for at least 3 weeks , and radiates to chest wall.  The pain is intermittent and aggravated by use of right arm .  He has occupation overuse risk as he is frequently lifting boxes of > 20 lbs  overhead on a daily basis where he works ( [employer]).    Outpatient Medications Prior to Visit  Medication Sig Dispense Refill  . metoprolol succinate (TOPROL-XL) 25 MG 24 hr tablet Take 1 tablet (25 mg total) by mouth daily. 90 tablet 3  . montelukast (SINGULAIR) 10 MG tablet Take 1 tablet (10 mg total) by mouth at bedtime. 90 tablet 3  . pantoprazole (PROTONIX) 40 MG tablet Take 1 tablet (40 mg total) by mouth daily. 30 tablet 3  . PARoxetine (PAXIL) 10 MG tablet Take 1 tablet (10 mg total) by mouth daily with supper. 30 tablet 5  . omeprazole (PRILOSEC) 40 MG capsule Take 1 capsule (40 mg total) by mouth daily. (Patient not taking: Reported on 02/01/2020) 30 capsule 3   No facility-administered medications prior to visit.    Review of Systems;  Patient denies headache, fevers, malaise, unintentional weight loss, skin rash, eye pain, sinus congestion and sinus pain, sore throat, dysphagia,  hemoptysis , cough, dyspnea, wheezing,  palpitations, orthopnea, edema, abdominal pain, nausea, melena, diarrhea, constipation, flank pain, dysuria, hematuria, urinary  Frequency, nocturia, numbness, tingling, seizures,  Focal weakness, Loss of consciousness,  Tremor, insomnia, depression,  and suicidal ideation.  Objective:  BP 104/66 (BP Location: Left Arm, Patient Position: Sitting, Cuff Size: Normal)   Pulse 62   Temp (!) 97 F (36.1 C) (Temporal)   Resp 14   Ht 6\' 2"  (1.88 m)   Wt 159 lb (72.1 kg)   SpO2 97%   BMI 20.41 kg/m   BP Readings from Last 3 Encounters:  02/01/20 104/66  07/26/19 116/82  07/07/19 98/68    Wt Readings from Last 3 Encounters:  02/01/20 159 lb (72.1 kg)  01/04/20 156 lb (70.8 kg)  10/21/19  156 lb (70.8 kg)    General appearance: alert, cooperative and appears stated age Ears: normal TM's and external ear canals both ears Throat: lips, mucosa, and tongue normal; teeth and gums normal Neck: no adenopathy, no carotid bruit, supple, symmetrical, trachea midline and thyroid not enlarged, symmetric, no tenderness/mass/nodules Back: symmetric, no curvature. ROM normal. No CVA tenderness. Lungs: clear to auscultation bilaterally Heart: regular rate and rhythm, S1, S2 normal, no murmur, click, rub or gallop Abdomen: soft, non-tender; bowel sounds normal; no masses,  no organomegaly Pulses: 2+ and symmetric Skin: Skin color, texture, turgor normal. No rashes or lesions Lymph nodes: Cervical, supraclavicular, and axillary nodes normal. Psych: affect normal, makes good eye contact. No fidgeting,  Smiles easily.  Denies suicidal thoughts   Lab Results  Component Value Date   HGBA1C 5.4 05/27/2013   HGBA1C 5.4 09/21/2012    Lab Results  Component Value Date   CREATININE 1.13 01/23/2020   CREATININE 0.97 12/13/2019   CREATININE 0.94 10/19/2019    Lab Results  Component Value Date   WBC 3.7 (L) 01/23/2020   HGB 15.6 01/23/2020   HCT 46.4 01/23/2020   PLT 168.0 01/23/2020   GLUCOSE 102 (H) 01/23/2020   CHOL 113 01/23/2020   TRIG 56.0 01/23/2020   HDL 49.70 01/23/2020   LDLCALC 52 01/23/2020   ALT 12 01/23/2020   AST 13 01/23/2020   NA 140 01/23/2020   K 4.4 01/23/2020   CL 102 01/23/2020   CREATININE 1.13 01/23/2020   BUN 14 01/23/2020   CO2 30 01/23/2020   TSH 2.10 01/23/2020   INR 1.3 (H) 02/24/2014   HGBA1C 5.4 05/27/2013     Assessment & Plan:   Problem List Items Addressed This Visit      Unprioritized   Nonalcoholic fatty liver disease without nonalcoholic steatohepatitis (NASH)    LFTs normal  Except for mild stable elevation of T Bili  No significant change during use of lamisil.  He is no longer using the medication.    Lab Results  Component  Value Date   ALT 12 01/23/2020   AST 13 01/23/2020   ALKPHOS 56 01/23/2020   BILITOT 1.9 (H) 01/23/2020         Allergic rhinitis    He is requesting to resume singulair.  Refills given       IBS (irritable bowel syndrome)    Trial of dicyclomine for lower abdominal post prandial cramping .  Advised to add Beano 10 minutes before meals.       Relevant Medications   dicyclomine (BENTYL) 10 MG capsule   Anxiety as acute reaction to exceptional stress    Complicated by grief.  Continue paxill, metoprolol for anxiety induced tachycardia,  And refer to 01/25/2020 for counsellin       Relevant Orders   Ambulatory referral to Psychology   Tachycardia - Primary    I have reviewed an EKG tracing from today.  Sinus tachycardia/sinus rhythm. Continue metoprolol. Metabolic causes ruled out.    Lab Results  Component Value Date   TSH 2.10 01/23/2020   Lab Results  Component Value Date   WBC 3.7 (L) 01/23/2020   HGB 15.6 01/23/2020   HCT 46.4 01/23/2020   MCV 92.9 01/23/2020   PLT 168.0 01/23/2020   Lab Results  Component Value Date   NA 140 01/23/2020   K 4.4 01/23/2020   CL 102 01/23/2020   CO2 30 01/23/2020         Relevant Orders   EKG 12-Lead (Completed)   Acromioclavicular joint pain    Secondary to overuse.  Needs to avoid NSAIDS due to history of ulcer and recent recurrence of gastritis.  Salon pas patch with lidocaine,  Activity modification        Other Visit Diagnoses    Grief counseling       Relevant Orders   Ambulatory referral to Psychology      I have discontinued Sonia Side L. Phimmasone's omeprazole. I am also having him start on dicyclomine. Additionally, I am having him maintain his montelukast, metoprolol succinate, PARoxetine, and pantoprazole.  Meds ordered this encounter  Medications  . dicyclomine (BENTYL) 10 MG capsule    Sig: Take 1 capsule (10 mg total) by mouth 4 (four) times daily -  before meals and at bedtime.    Dispense:  90 capsule     Refill:  1    Medications Discontinued During This Encounter  Medication Reason  . omeprazole (PRILOSEC) 40 MG capsule Change in therapy   A total of 40 minutes was spent with patient more than half of which was spent in counseling patient on the above mentioned issues , reviewing and explaining recent labs and imaging studies done, and coordination of care. Follow-up: No follow-ups on file.   Crecencio Mc, MD

## 2020-02-01 NOTE — Patient Instructions (Signed)
Continue paroxetine  And metoprolol for at least 3 months. Contact me if you feel you want to stop them  Referral to grief counsellor is under way . (they work virtually)   Trial of dicyclomine , an anti spasmodic  For stomach cramps.  Take 30 mintes before meals    Try using beano  10 minutes  before any meal with beans , cabbage,  Broccoli, cauliflower and brussel sprouts  Your shoulder pain is coming from the A/C joint caused by recurrent lifting of boxes.  Try salon pas with lidocaine,  Ice,  But no NSAIDS because they will hurt your stomach

## 2020-02-03 DIAGNOSIS — M25519 Pain in unspecified shoulder: Secondary | ICD-10-CM | POA: Insufficient documentation

## 2020-02-03 NOTE — Assessment & Plan Note (Signed)
Secondary to overuse.  Needs to avoid NSAIDS due to history of ulcer and recent recurrence of gastritis.  Salon pas patch with lidocaine,  Activity modification

## 2020-02-03 NOTE — Assessment & Plan Note (Signed)
I have reviewed an EKG tracing from today.  Sinus tachycardia/sinus rhythm. Continue metoprolol. Metabolic causes ruled out.    Lab Results  Component Value Date   TSH 2.10 01/23/2020   Lab Results  Component Value Date   WBC 3.7 (L) 01/23/2020   HGB 15.6 01/23/2020   HCT 46.4 01/23/2020   MCV 92.9 01/23/2020   PLT 168.0 01/23/2020   Lab Results  Component Value Date   NA 140 01/23/2020   K 4.4 01/23/2020   CL 102 01/23/2020   CO2 30 01/23/2020

## 2020-02-03 NOTE — Assessment & Plan Note (Signed)
Trial of dicyclomine for lower abdominal post prandial cramping .  Advised to add Beano 10 minutes before meals.

## 2020-02-03 NOTE — Assessment & Plan Note (Signed)
Complicated by grief.  Continue paxill, metoprolol for anxiety induced tachycardia,  And refer to [facility] for counsellin

## 2020-02-03 NOTE — Assessment & Plan Note (Signed)
He is requesting to resume singulair.  Refills given

## 2020-02-03 NOTE — Assessment & Plan Note (Signed)
LFTs normal  Except for mild stable elevation of T Bili  No significant change during use of lamisil.  He is no longer using the medication.    Lab Results  Component Value Date   ALT 12 01/23/2020   AST 13 01/23/2020   ALKPHOS 56 01/23/2020   BILITOT 1.9 (H) 01/23/2020

## 2020-02-28 ENCOUNTER — Ambulatory Visit (INDEPENDENT_AMBULATORY_CARE_PROVIDER_SITE_OTHER): Payer: 59 | Admitting: Psychology

## 2020-02-28 DIAGNOSIS — F4323 Adjustment disorder with mixed anxiety and depressed mood: Secondary | ICD-10-CM | POA: Diagnosis not present

## 2020-03-06 ENCOUNTER — Ambulatory Visit (INDEPENDENT_AMBULATORY_CARE_PROVIDER_SITE_OTHER): Payer: 59 | Admitting: Psychology

## 2020-03-06 DIAGNOSIS — F4323 Adjustment disorder with mixed anxiety and depressed mood: Secondary | ICD-10-CM | POA: Diagnosis not present

## 2020-03-13 ENCOUNTER — Ambulatory Visit: Payer: 59 | Admitting: Psychology

## 2020-03-23 ENCOUNTER — Ambulatory Visit (INDEPENDENT_AMBULATORY_CARE_PROVIDER_SITE_OTHER): Payer: 59 | Admitting: Psychology

## 2020-03-23 DIAGNOSIS — F4323 Adjustment disorder with mixed anxiety and depressed mood: Secondary | ICD-10-CM

## 2020-04-07 ENCOUNTER — Other Ambulatory Visit: Payer: Self-pay | Admitting: Internal Medicine

## 2020-04-24 ENCOUNTER — Other Ambulatory Visit: Payer: Self-pay

## 2020-04-24 ENCOUNTER — Ambulatory Visit (INDEPENDENT_AMBULATORY_CARE_PROVIDER_SITE_OTHER): Payer: 59 | Admitting: Psychology

## 2020-04-24 DIAGNOSIS — F4323 Adjustment disorder with mixed anxiety and depressed mood: Secondary | ICD-10-CM

## 2020-04-24 MED ORDER — PANTOPRAZOLE SODIUM 40 MG PO TBEC: 40.0000 mg | DELAYED_RELEASE_TABLET | Freq: Every day | ORAL | 1 refills | Status: DC

## 2020-05-16 ENCOUNTER — Other Ambulatory Visit: Payer: Self-pay

## 2020-05-16 MED ORDER — PAROXETINE HCL 10 MG PO TABS: 10.0000 mg | ORAL_TABLET | Freq: Every day | ORAL | 1 refills | Status: DC

## 2020-05-17 ENCOUNTER — Ambulatory Visit (INDEPENDENT_AMBULATORY_CARE_PROVIDER_SITE_OTHER): Payer: 59 | Admitting: Psychology

## 2020-05-17 DIAGNOSIS — F4323 Adjustment disorder with mixed anxiety and depressed mood: Secondary | ICD-10-CM

## 2020-05-18 ENCOUNTER — Other Ambulatory Visit: Payer: Self-pay | Admitting: Internal Medicine

## 2020-06-11 ENCOUNTER — Ambulatory Visit: Payer: 59 | Admitting: Psychology

## 2020-06-20 ENCOUNTER — Other Ambulatory Visit: Payer: Self-pay

## 2020-06-20 ENCOUNTER — Encounter: Payer: Self-pay | Admitting: Internal Medicine

## 2020-06-20 ENCOUNTER — Ambulatory Visit (INDEPENDENT_AMBULATORY_CARE_PROVIDER_SITE_OTHER): Payer: 59 | Admitting: Internal Medicine

## 2020-06-20 VITALS — BP 100/76 | HR 82 | Temp 97.6°F | Resp 15 | Ht 74.0 in | Wt 171.6 lb

## 2020-06-20 DIAGNOSIS — B351 Tinea unguium: Secondary | ICD-10-CM | POA: Diagnosis not present

## 2020-06-20 DIAGNOSIS — L03031 Cellulitis of right toe: Secondary | ICD-10-CM

## 2020-06-20 DIAGNOSIS — K589 Irritable bowel syndrome without diarrhea: Secondary | ICD-10-CM | POA: Diagnosis not present

## 2020-06-20 MED ORDER — CEPHALEXIN 500 MG PO CAPS: 500.0000 mg | ORAL_CAPSULE | Freq: Four times a day (QID) | ORAL | 0 refills | Status: DC

## 2020-06-20 NOTE — Progress Notes (Signed)
Subjective:  Patient ID: Christian Montgomery, male    DOB: 20-May-1991  Age: 29 y.o. MRN: [id number]  CC: The primary encounter diagnosis was Onychomycosis of toenail. Diagnoses of Irritable bowel syndrome without diarrhea and Paronychia of great toe of right foot were also pertinent to this visit.  HPI Christian Montgomery presents for follow up on toe fungus  Involving the right great toe.       Treated from august 2020 to feb 2021 with lamisil.  Toenail never normalized completely but only the distal nail was affected  At the time the medication was stopped.  Over the last several months the entire nail except for the proximal 10%  Has become opaque and yellow,  And the the last week or tow has been aving cuticle  Pain and  redness .  No history  Of trauma ever   IBS:  Started bentyl in march. Using  It 4 times daily which helped initially  But now not helping In spite of taking it  30 minutes before eating.  Pain is located on both sides of abdomen.  brought on by nuts , popcorn, red meat,  And red  sauces .  Baked or grilled chicken do not bother him.  Ice cream bothers him. Bacon and sausage bother him intermittently when he eats it with a biscuit for breakfast.  tDoes not eat vegetables except corn which bothers it sometimes,   Stools change from one to 3 times daily,  Solid to soft .  No liquid stools.  Pain is sharp and cramping,  Only 2 episodes of nausea without vomiting  In the past 3 months .Marland Kitchen  Feels gassy a lot.  GAs x not helping. .  Still taking pantoprazole.    Has gained 12 lbs since March visit.  Appetite is good.    Wants referral to nutritonist    Sleeping better with paxil and metoprolol .    Outpatient Medications Prior to Visit  Medication Sig Dispense Refill  . dicyclomine (BENTYL) 10 MG capsule TAKE 1 CAPSULE BY MOUTH FOUR TIMES A DAY, BEFORE MEALS AND BEDTIME 90 capsule 1  . metoprolol succinate (TOPROL-XL) 25 MG 24 hr tablet Take 1 tablet (25 mg total) by mouth daily. 90 tablet  3  . montelukast (SINGULAIR) 10 MG tablet Take 1 tablet (10 mg total) by mouth at bedtime. 90 tablet 3  . pantoprazole (PROTONIX) 40 MG tablet Take 1 tablet (40 mg total) by mouth daily. 90 tablet 1  . PARoxetine (PAXIL) 10 MG tablet Take 1 tablet (10 mg total) by mouth daily with supper. 30 tablet 1   No facility-administered medications prior to visit.    Review of Systems;  Patient denies headache, fevers, malaise, unintentional weight loss, skin rash, eye pain, sinus congestion and sinus pain, sore throat, dysphagia,  hemoptysis , cough, dyspnea, wheezing, chest pain, palpitations, orthopnea, edema, abdominal pain, nausea, melena, diarrhea, constipation, flank pain, dysuria, hematuria, urinary  Frequency, nocturia, numbness, tingling, seizures,  Focal weakness, Loss of consciousness,  Tremor, insomnia, depression, anxiety, and suicidal ideation.      Objective:  BP 100/76 (BP Location: Left Arm, Patient Position: Sitting, Cuff Size: Normal)   Pulse 82   Temp 97.6 F (36.4 C) (Oral)   Resp 15   Ht 6\' 2"  (1.88 m)   Wt 171 lb 9.6 oz (77.8 kg)   SpO2 99%   BMI 22.03 kg/m   BP Readings from Last 3 Encounters:  06/20/20 100/76  02/01/20  104/66  07/26/19 116/82    Wt Readings from Last 3 Encounters:  06/20/20 171 lb 9.6 oz (77.8 kg)  02/01/20 159 lb (72.1 kg)  01/04/20 156 lb (70.8 kg)    General appearance: alert, cooperative and appears stated age Ears: normal TM's and external ear canals both ears Throat: lips, mucosa, and tongue normal; teeth and gums normal Neck: no adenopathy, no carotid bruit, supple, symmetrical, trachea midline and thyroid not enlarged, symmetric, no tenderness/mass/nodules Back: symmetric, no curvature. ROM normal. No CVA tenderness. Lungs: clear to auscultation bilaterally Heart: regular rate and rhythm, S1, S2 normal, no murmur, click, rub or gallop Abdomen: soft, non-tender; bowel sounds normal; no masses,  no organomegaly Pulses: 2+ and  symmetric Skin: Skin color, texture, turgor normal. No rashes or lesions Lymph nodes: Cervical, supraclavicular, and axillary nodes normal.  Lab Results  Component Value Date   HGBA1C 5.4 05/27/2013   HGBA1C 5.4 09/21/2012    Lab Results  Component Value Date   CREATININE 1.13 01/23/2020   CREATININE 0.97 12/13/2019   CREATININE 0.94 10/19/2019    Lab Results  Component Value Date   WBC 3.7 (L) 01/23/2020   HGB 15.6 01/23/2020   HCT 46.4 01/23/2020   PLT 168.0 01/23/2020   GLUCOSE 102 (H) 01/23/2020   CHOL 113 01/23/2020   TRIG 56.0 01/23/2020   HDL 49.70 01/23/2020   LDLCALC 52 01/23/2020   ALT 12 01/23/2020   AST 13 01/23/2020   NA 140 01/23/2020   K 4.4 01/23/2020   CL 102 01/23/2020   CREATININE 1.13 01/23/2020   BUN 14 01/23/2020   CO2 30 01/23/2020   TSH 2.10 01/23/2020   INR 1.3 (H) 02/24/2014   HGBA1C 5.4 05/27/2013    MR WRIST LEFT WO CONTRAST  Result Date: 08/04/2019 CLINICAL DATA:  Radial side left wrist pain for 3 months. EXAM: MR OF THE LEFT WRIST WITHOUT CONTRAST TECHNIQUE: Multiplanar, multisequence MR imaging of the left wrist was performed. No intravenous contrast was administered. COMPARISON:  Radiographs dated 06/02/2019 FINDINGS: Ligaments: Intact scapholunate and lunotriquetral ligaments. Triangular fibrocartilage: Intact TFCC. Tendons: Intact flexor and extensor compartment tendons. Carpal tunnel/median nerve: Normal carpal tunnel. Normal median nerve. Guyon's canal: Normal. Joint/cartilage: No joint effusion. No chondral defect. Bones/carpal alignment: No marrow signal abnormality. Normal alignment. No aggressive osseous lesion. Other: None IMPRESSION: Normal MRI of the left wrist. Electronically Signed   By: Francene Boyers M.D.   On: 08/04/2019 08:59    Assessment & Plan:   Problem List Items Addressed This Visit      Unprioritized   IBS (irritable bowel syndrome)    He is not following a low FodMAP diet and is requesting referral to  nutritionist.  Continue dicyclomine      Relevant Orders   Amb Referral to Nutrition and Diabetic E   Onychomycosis of toenail - Primary    Recurrent despite 6 months of lamisil.  Referral to podiatry for evaluation       Relevant Medications   cephALEXin (KEFLEX) 500 MG capsule   Other Relevant Orders   Ambulatory referral to Podiatry   Paronychia of great toe of right foot    Cephalexin four times daily         Relevant Medications   cephALEXin (KEFLEX) 500 MG capsule      I provided  30 minutes of  face-to-face time during this encounter reviewing patient's current problems and past surgeries, labs and imaging studies, providing counseling on the above mentioned problems , and  coordination  of care . I am having Dorene Sorrow L. Chaffin start on cephALEXin. I am also having him maintain his montelukast, metoprolol succinate, pantoprazole, PARoxetine, and dicyclomine.  Meds ordered this encounter  Medications  . cephALEXin (KEFLEX) 500 MG capsule    Sig: Take 1 capsule (500 mg total) by mouth 4 (four) times daily.    Dispense:  28 capsule    Refill:  0    There are no discontinued medications.  Follow-up: Return in about 6 months (around 12/21/2020).   Sherlene Shams, MD

## 2020-06-20 NOTE — Patient Instructions (Addendum)
I am making a Referral to podiatry for  The  toenail problem  I am prescribing an antibiotic called Keflex 4 times daily for the cuticle infection ("paronychia")  Please take a probiotic ( the generic equivalent of Align, Floraque or Culturelle) while you are on the antibiotic PLUS 2 MORE WEEKS AFTER THAT  to prevent a serious antibiotic associated diarrhea  Called clostirudium dificile colitis    Referral to Nutritionist for IBS diet  Try Mylanta Gas or Phasyme   Diet for Irritable Bowel Syndrome When you have irritable bowel syndrome (IBS), it is very important to eat the foods and follow the eating habits that are best for your condition. IBS may cause various symptoms such as pain in the abdomen, constipation, or diarrhea. Choosing the right foods can help to ease the discomfort from these symptoms. Work with your health care provider and diet and nutrition specialist (dietitian) to find the eating plan that will help to control your symptoms. What are tips for following this plan?      Keep a food diary. This will help you identify foods that cause symptoms. Write down: ? What you eat and when you eat it. ? What symptoms you have. ? When symptoms occur in relation to your meals, such as "pain in abdomen 2 hours after dinner."  Eat your meals slowly and in a relaxed setting.  Aim to eat 5-6 small meals per day. Do not skip meals.  Drink enough fluid to keep your urine pale yellow.  Ask your health care provider if you should take an over-the-counter probiotic to help restore healthy bacteria in your gut (digestive tract). ? Probiotics are foods that contain good bacteria and yeasts.  Your dietitian may have specific dietary recommendations for you based on your symptoms. He or she may recommend that you: ? Avoid foods that cause symptoms. Talk with your dietitian about other ways to get the same nutrients that are in those problem foods. ? Avoid foods with gluten. Gluten is a  protein that is found in rye, wheat, and barley. ? Eat more foods that contain soluble fiber. Examples of foods with high soluble fiber include oats, seeds, and certain fruits and vegetables. Take a fiber supplement if directed by your dietitian. ? Reduce or avoid certain foods called FODMAPs. These are foods that contain carbohydrates that are hard to digest. Ask your doctor which foods contain these carbohydrates. What foods are not recommended? The following are some foods and drinks that may make your symptoms worse:  Fatty foods, such as french fries.  Foods that contain gluten, such as pasta and cereal.  Dairy products, such as milk, cheese, and ice cream.  Chocolate.  Alcohol.  Products with caffeine, such as coffee.  Carbonated drinks, such as soda.  Foods that are high in FODMAPs. These include certain fruits and vegetables.  Products with sweeteners such as honey, high fructose corn syrup, sorbitol, and mannitol. The items listed above may not be a complete list of foods and beverages you should avoid. Contact a dietitian for more information. What foods are good sources of fiber? Your health care provider or dietitian may recommend that you eat more foods that contain fiber. Fiber can help to reduce constipation and other IBS symptoms. Add foods with fiber to your diet a little at a time so your body can get used to them. Too much fiber at one time might cause gas and swelling of your abdomen. The following are some foods that are  good sources of fiber:  Berries, such as raspberries, strawberries, and blueberries.  Tomatoes.  Carrots.  Brown rice.  Oats.  Seeds, such as chia and pumpkin seeds. The items listed above may not be a complete list of recommended sources of fiber. Contact your dietitian for more options. Where to find more information  International Foundation for Functional Gastrointestinal Disorders: www.iffgd.[employer] of Diabetes and  Digestive and Kidney Diseases: CarFli[employer].tn Summary  When you have irritable bowel syndrome (IBS), it is very important to eat the foods and follow the eating habits that are best for your condition.  IBS may cause various symptoms such as pain in the abdomen, constipation, or diarrhea.  Choosing the right foods can help to ease the discomfort that comes from symptoms.  Keep a food diary. This will help you identify foods that cause symptoms.  Your health care provider or diet and nutrition specialist (dietitian) may recommend that you eat more foods that contain fiber. This information is not intended to replace advice given to you by your health care provider. Make sure you discuss any questions you have with your health care provider. Document Revised: 03/09/2019 Document Reviewed: 07/21/2017 Elsevier Patient Education  2020 [employer].

## 2020-06-21 DIAGNOSIS — L03031 Cellulitis of right toe: Secondary | ICD-10-CM | POA: Insufficient documentation

## 2020-06-21 NOTE — Assessment & Plan Note (Signed)
Recurrent despite 6 months of lamisil.  Referral to podiatry for evaluation

## 2020-06-21 NOTE — Assessment & Plan Note (Signed)
Cephalexin four times daily

## 2020-06-21 NOTE — Assessment & Plan Note (Signed)
He is not following a low FodMAP diet and is requesting referral to nutritionist.  Continue dicyclomine

## 2020-06-28 ENCOUNTER — Ambulatory Visit: Payer: 59 | Admitting: Psychology

## 2020-07-02 MED ORDER — PAROXETINE HCL 10 MG PO TABS: 10.0000 mg | ORAL_TABLET | Freq: Every day | ORAL | 1 refills | Status: DC

## 2020-07-10 ENCOUNTER — Encounter: Payer: 59 | Attending: Internal Medicine | Admitting: Dietician

## 2020-07-10 ENCOUNTER — Encounter: Payer: Self-pay | Admitting: Dietician

## 2020-07-10 ENCOUNTER — Other Ambulatory Visit: Payer: Self-pay

## 2020-07-10 ENCOUNTER — Ambulatory Visit: Payer: 59 | Admitting: Podiatry

## 2020-07-10 VITALS — Ht 74.0 in | Wt 170.4 lb

## 2020-07-10 DIAGNOSIS — B351 Tinea unguium: Secondary | ICD-10-CM

## 2020-07-10 DIAGNOSIS — M79674 Pain in right toe(s): Secondary | ICD-10-CM | POA: Diagnosis not present

## 2020-07-10 DIAGNOSIS — Z79899 Other long term (current) drug therapy: Secondary | ICD-10-CM | POA: Diagnosis not present

## 2020-07-10 DIAGNOSIS — K589 Irritable bowel syndrome without diarrhea: Secondary | ICD-10-CM | POA: Diagnosis present

## 2020-07-10 MED ORDER — TERBINAFINE HCL 250 MG PO TABS: 250.0000 mg | ORAL_TABLET | Freq: Every day | ORAL | 0 refills | Status: DC

## 2020-07-10 NOTE — Progress Notes (Signed)
   Subjective: 29 y.o. male presenting today for evaluation of discoloration and thickening to the right hallux nail plate. Patient states that is been present for greater than 1 year. He denies injury or trauma to the area. Approximately 1 year ago he was on a prescription of Lamisil by his PCP and he took the pill for approximately 6 months. At that time the Lamisil was discontinued. He states that he noticed significant improvement with the oral Lamisil. He presents today for further treatment and evaluation  Past Medical History:  Diagnosis Date  . Gastric ulcer with obstruction April 2013   [facility]    Objective: Physical Exam General: The patient is alert and oriented x3 in no acute distress.  Dermatology: Hyperkeratotic, discolored, thickened, onychodystrophy noted to the right hallux nail plate. Skin is warm, dry and supple bilateral lower extremities. Negative for open lesions or macerations.  Vascular: Palpable pedal pulses bilaterally. No edema or erythema noted. Capillary refill within normal limits.  Neurological: Epicritic and protective threshold grossly intact bilaterally.   Musculoskeletal Exam: Range of motion within normal limits to all pedal and ankle joints bilateral. Muscle strength 5/5 in all groups bilateral.   Assessment: #1 Onychomycosis right hallux nail plate #2 Hyperkeratotic nail right hallux nail plate  Plan of Care:  #1 Patient was evaluated. #2 Today we explained the etiology of discolored nails. I do believe that the presentation of his nail is likely fungal etiology versus microtrauma. #3 orders placed for hepatic function panel. If liver function is abnormal office will contact patient to discontinue medication #4 prescription for Lamisil 250 mg #90 daily. #5 when the patient completes his 90-day supply of Lamisil, he can return to the office for additional paperwork for a repeat hepatic function panel. If the hepatic function panel is  within normal limits we will refill an additional 90-day supply. #6 return to clinic in 6 months  Felecia Shelling, DPM [facility]  Dr. Felecia Shelling, DPM    [address]                                        [place], [place] [address]                Office [phone]  Fax [phone]

## 2020-07-10 NOTE — Patient Instructions (Addendum)
   Make one meal a day at home   Switch from tea/soda to water for at least lunch or dinner   Decrease fried food- choose grilled chicken over fried, swap fries for fruit cup

## 2020-07-10 NOTE — Progress Notes (Signed)
Medical Nutrition Therapy: Visit start time: 1315  end time: 1415 Assessment:  Diagnosis: IBS Past medical history: GI reflux Psychosocial issues/ stress concerns: pt rates stress level as "moderate" and feels "ok" about self stress management  Preferred learning method:  . Auditory . Visual . Hands-on  Current weight: 170.4 lbs  Height: 6'2" Medications, supplements: reconciled in medical record   Progress and evaluation:   Pt reports having an ulcer caused by foods in diet about 8 years ago    Mentioned fried foods as possible cause   Pt states he is most interested in learning how to eat healthier   Pt reports IBS is most aggravated by fried foods, red meat, soda, and sometimes lettuce; pt does not eat dairy  Pt rates pain in office 2/10 and reports that after eating aggravating foods pain may increase and may last "a day or so"  Pt reports dining out for all meals everyday of the week  Pt denies trying a low FODMAP diet or elimination-style diet in the past, also states he is "not ready" to try a low FODMAP diet at this time   Physical activity: walk/run 1 a week   Dietary Intake:  Usual eating pattern includes 3 meals and 1-2 snacks per day. Dining out frequency: 21 meals per week.  Breakfast: chik-fil-a chicken nuggets, chicken sandwich fries and large sweet tea  Snack: crackers, chips, cantaloupe, watermelon, grapes  Lunch: chik-fil-a chicken nuggets, chicken sandwich fries and large sweet tea  Supper: same as above; steak/hamburger with baked potato/fries/corn  Beverages: sweet tea, 16 oz water, soda  Diet includes little/no F/V intake.    Nutrition Care Education:  Basic nutrition: basic food groups, appropriate nutrient balance, appropriate meal and snack schedule, general nutrition guidelines    Advanced nutrition:  dining out Gut health: low FODMAP diet, fiber, fluid intake Other lifestyle changes:  benefits of making changes, pinpointing motivation,  readiness for change, identifying habits that need to change, sleep quality, stress management  Nutritional Diagnosis:  NB-1.3 Not ready for diet/lifestyle change  As related to disinterested in learning/applying information for IBS management.  As evidenced by pt reported not wanting to try low FODMAP or elimination diet; reluctant to change eating habits;  Marland Kitchen  Intervention:  At this time, pt does not seem ready to make dietary changes that may help most with IBS symptoms.  Pt reports eating foods on a daily basis that increase IBS symptoms and does not want to try a low FODMAP diet or modified elimination diet, despite stating that he wants to improve his overall health and 'eat healthier.'  Discussed other areas he can work on to improve health- eating more meals at home, how stress and sleep also impact gut health.       Increase fruit and vegetable intake   Incorporate vegetables at lunch and dinner   Incorporate more F/V at snacks  Start eating breakfast at home, include fruit    Decrease sugar-sweetened beverage consumption   Switch from sweet tea and regular soda to more water  Drink at least 64oz water daily (add crystal light, lemon, or mio as needed)    Mindful eating principles  Chew food thoroughly   Eat meals and snacks that satisfy but do not eat past full   Think of hunger scale (eat at 3, stop eating at 7; avoid extremes of starving/stuffed)   Hydration   Drink at least 64 oz water daily   Try lemon water and ginger tea to  help with GI upset   Education Materials given:  Marland Kitchen Quick and balanced meals  . Low and High FODMAP food lists . Three Phases of low FODMAP diet handout . Goals/ instructions  Learner/ who was taught:  . Patient   Level of understanding: Marland Kitchen Verbalizes/ demonstrates competency  Demonstrated degree of understanding via:   Teach back Learning barriers: . None  Willingness to learn/ readiness for change: . Hesitance, contemplating  change  Monitoring and Evaluation:  Dietary intake, exercise, GI symptoms, and body weight      follow up: prn

## 2020-07-11 ENCOUNTER — Ambulatory Visit: Payer: 59 | Admitting: Dietician

## 2020-07-20 ENCOUNTER — Ambulatory Visit: Payer: Self-pay | Admitting: Psychology

## 2020-08-16 ENCOUNTER — Other Ambulatory Visit: Payer: Self-pay | Admitting: Internal Medicine

## 2020-08-21 ENCOUNTER — Ambulatory Visit: Payer: 59 | Admitting: Dietician

## 2020-08-26 ENCOUNTER — Ambulatory Visit (INDEPENDENT_AMBULATORY_CARE_PROVIDER_SITE_OTHER): Payer: 59 | Admitting: Psychology

## 2020-08-26 DIAGNOSIS — F4323 Adjustment disorder with mixed anxiety and depressed mood: Secondary | ICD-10-CM | POA: Diagnosis not present

## 2020-08-28 ENCOUNTER — Other Ambulatory Visit: Payer: Self-pay

## 2020-08-28 MED ORDER — DICYCLOMINE HCL 10 MG PO CAPS: ORAL_CAPSULE | ORAL | 1 refills | Status: DC

## 2020-08-29 ENCOUNTER — Other Ambulatory Visit: Payer: Self-pay | Admitting: Internal Medicine

## 2020-09-03 ENCOUNTER — Encounter: Payer: 59 | Attending: Internal Medicine | Admitting: Dietician

## 2020-09-03 DIAGNOSIS — K589 Irritable bowel syndrome without diarrhea: Secondary | ICD-10-CM | POA: Insufficient documentation

## 2020-09-17 DIAGNOSIS — N469 Male infertility, unspecified: Secondary | ICD-10-CM

## 2020-09-18 ENCOUNTER — Ambulatory Visit: Payer: 59 | Admitting: Psychology

## 2020-09-28 NOTE — Telephone Encounter (Signed)
Marcelino Duster from [facility] called and asked if we could fax the rx for sperm analysis again. She states that they did not receive it. Fax number is [phone]

## 2020-10-01 NOTE — Telephone Encounter (Signed)
Order re-faxed to the number given

## 2020-10-08 ENCOUNTER — Ambulatory Visit (INDEPENDENT_AMBULATORY_CARE_PROVIDER_SITE_OTHER): Payer: 59 | Admitting: Psychology

## 2020-10-08 DIAGNOSIS — F4323 Adjustment disorder with mixed anxiety and depressed mood: Secondary | ICD-10-CM | POA: Diagnosis not present

## 2020-10-10 ENCOUNTER — Other Ambulatory Visit: Payer: Self-pay | Admitting: Internal Medicine

## 2020-10-17 ENCOUNTER — Other Ambulatory Visit: Payer: Self-pay | Admitting: Internal Medicine

## 2020-10-18 ENCOUNTER — Ambulatory Visit (INDEPENDENT_AMBULATORY_CARE_PROVIDER_SITE_OTHER): Payer: 59 | Admitting: Psychology

## 2020-10-18 DIAGNOSIS — F4323 Adjustment disorder with mixed anxiety and depressed mood: Secondary | ICD-10-CM

## 2020-10-29 ENCOUNTER — Ambulatory Visit: Payer: 59 | Admitting: Psychology

## 2020-10-31 ENCOUNTER — Telehealth: Payer: Self-pay

## 2020-10-31 NOTE — Telephone Encounter (Signed)
Patient was instructed to go to [facility].       [facility] [place] TELEPHONE ADVICE RECORD AccessNurse Patient Name: KYL GIVLER Gender: Male DOB: [birth year] Age: 29 Y 10 M 21 D Return Phone Number: [phone] (Primary) Address: City/State/Zip: [place] [address] Client [facility] Client Site [facility] - Day Physician Duncan Dull - MD Contact Type Call Who Is Calling Patient / Member / Family / Caregiver Call Type Triage / Clinical Relationship To Patient Self Return Phone Number [phone] (Primary) Chief Complaint Walking difficulty Reason for Call Symptomatic / Request for Health Information Initial Comment Caller states he is fatigued for about 2 weeks now ,burning sensation in both his legs. Last night it was difficult to walk, and the more he walks the worse it gets Translation No Nurse Assessment Nurse: Kizzie Bane, RN, Marylene Land Date/Time (Eastern Time): 10/31/2020 9:53:50 AM Confirm and document reason for call. If symptomatic, describe symptoms. ---Caller states he is having pain and burning in both of his legs in the left sides of the thighs. He feels fatigued as well. No fever Does the patient have any new or worsening symptoms? ---Yes Will a triage be completed? ---Yes Related visit to physician within the last 2 weeks? ---No Does the PT have any chronic conditions? (i.e. diabetes, asthma, this includes High risk factors for pregnancy, etc.) ---No Is this a behavioral health or substance abuse call? ---No Guidelines Guideline Title Affirmed Question Affirmed Notes Nurse Date/Time Lamount Cohen Time) Leg Pain [1] Red area or streak AND [2] large (> 2 in. or 5 cm) Kizzie Bane, RN, Marylene Land 10/31/2020 9:55:21 AM Disp. Time Lamount Cohen Time) Disposition Final User 10/31/2020 9:50:59 AM Send to Urgent Queue Fayne Norrie 10/31/2020 9:57:56 AM See HCP within 4 Hours (or  PCP triage) Yes Kizzie Bane, RN, Marylene Land PLEASE NOTE: All timestamps contained within this report are represented as [place] Standard Time. CONFIDENTIALTY NOTICE: This fax transmission is intended only for the addressee. It contains information that is legally privileged, confidential or otherwise protected from use or disclosure. If you are not the intended recipient, you are strictly prohibited from reviewing, disclosing, copying using or disseminating any of this information or taking any action in reliance on or regarding this information. If you have received this fax in error, please notify us immediately by telephone so that we can arrange for its return to [place]. Phone: [phone], Toll-Free: [phone], Fax: [phone] Page: 2 of 2 Call Id: [id number] Caller Disagree/Comply Comply Caller Understands Yes PreDisposition Did not know what to do Care Advice Given Per Guideline SEE HCP (OR PCP TRIAGE) WITHIN 4 HOURS: * IF OFFICE WILL BE OPEN: You need to be seen within the next 3 or 4 hours. Call your doctor (or NP/PA) now or as soon as the office opens. CALL BACK IF: * You become worse CARE ADVICE given per Leg Pain (Adult) guideline. Referrals REFERRED TO PCP OFFICE

## 2020-10-31 NOTE — Telephone Encounter (Signed)
Pt said he is having a burning sensation in both of his legs and he is fatigued all the time. I transferred patient to access nurse to be triaged.

## 2020-11-06 ENCOUNTER — Ambulatory Visit: Payer: 59 | Admitting: Psychology

## 2020-11-15 ENCOUNTER — Other Ambulatory Visit: Payer: Self-pay | Admitting: Internal Medicine

## 2020-12-05 ENCOUNTER — Other Ambulatory Visit: Payer: Self-pay | Admitting: Internal Medicine

## 2020-12-07 ENCOUNTER — Encounter: Payer: Self-pay | Admitting: Internal Medicine

## 2020-12-07 ENCOUNTER — Telehealth (INDEPENDENT_AMBULATORY_CARE_PROVIDER_SITE_OTHER): Payer: 59 | Admitting: Internal Medicine

## 2020-12-07 ENCOUNTER — Other Ambulatory Visit: Payer: Self-pay

## 2020-12-07 DIAGNOSIS — F411 Generalized anxiety disorder: Secondary | ICD-10-CM

## 2020-12-07 DIAGNOSIS — F4321 Adjustment disorder with depressed mood: Secondary | ICD-10-CM | POA: Diagnosis not present

## 2020-12-07 DIAGNOSIS — G479 Sleep disorder, unspecified: Secondary | ICD-10-CM | POA: Diagnosis not present

## 2020-12-07 DIAGNOSIS — F430 Acute stress reaction: Secondary | ICD-10-CM | POA: Diagnosis not present

## 2020-12-07 NOTE — Progress Notes (Signed)
Virtual visit converted to Telephone  Note  This visit type was conducted due to national recommendations for restrictions regarding the COVID-19 pandemic (e.g. social distancing).  This format is felt to be most appropriate for this patient at this time.  All issues noted in this document were discussed and addressed.  No physical exam was performed (except for noted visual exam findings with Video Visits).   I connected with@ on 12/07/20 at  4:30 PM EST by a video enabled telemedicine application  and verified that I am speaking with the correct person using two identifiers. Location patient: home Location provider: work or home office Persons participating in the virtual visit: patient, provider  I discussed the limitations, risks, security and privacy concerns of performing an evaluation and management service by telephone and the availability of in person appointments. I also discussed with the patient that there may be a patient responsible charge related to this service. The patient expressed understanding and agreed to proceed.  Interactive audio and video telecommunications were attempted between this provider and patient, however failed, due to patient having technical difficulties .  We continued and completed visit with audio only.  Reason for visit: sleep issues  HPI:  30 yr old male with GAD complicated by grief , presents with sleep disorder.  He reports a 6 month history of altered sleep,  And excessive fatigue during the day,  Wife notices that he is hard to wake him up, and he talks and moves his arms and legs during sleep .  He has trouble waking up  in the mornings despite averaging 8 hours of sleep.  He  often sleeps through his alarm clock.  Feels the need to nap during the day .  Often takes Naps on breaks, notices that his eyes feel "heavy"during stop lights but denies falling asleep.  NO prior sleep study.   Regarding his GAD,  Since he started taking paxil , he has been  feeling better. Not worrying ,  No panic attacks.  Still grieving the death of his sister but has many days that he does not cry.   Having sinus surgery on Jan 18  BP 163/96 but labile down to 116 systolic    ROS: See pertinent positives and negatives per HPI.  Past Medical History:  Diagnosis Date  . Gastric ulcer with obstruction April 2013   hosp [facility]    Past Surgical History:  Procedure Laterality Date  . COSMETIC SURGERY      Family History  Problem Relation Age of Onset  . COPD Father   . Alcohol abuse Father   . Heart disease Father   . Diabetes Sister 56       gestational, not overweight   . Diabetes Maternal Grandmother   . Cancer Maternal Grandmother        endometrial  . Early death Maternal Grandmother        cause unknown  . Heart failure Paternal Grandmother     SOCIAL HX:  reports that he has never smoked. He has never used smokeless tobacco. He reports that he does not drink alcohol and does not use drugs.   Current Outpatient Medications:  .  dicyclomine (BENTYL) 10 MG capsule, TAKE 1 CAPSULE BY MOUTH FOUR TIMES A DAY, BEFORE MEALS AND BEDTIME, Disp: 90 capsule, Rfl: 1 .  metoprolol succinate (TOPROL-XL) 25 MG 24 hr tablet, TAKE 1 TABLET BY MOUTH EVERY DAY, Disp: 90 tablet, Rfl: 2 .  montelukast (SINGULAIR)  10 MG tablet, TAKE 1 TABLET BY MOUTH EVERY DAY AT BEDTIME, Disp: 90 tablet, Rfl: 3 .  pantoprazole (PROTONIX) 40 MG tablet, TAKE 1 TABLET BY MOUTH EVERY DAY, Disp: 90 tablet, Rfl: 1 .  PARoxetine (PAXIL) 10 MG tablet, Take 1 tablet (10 mg total) by mouth daily with supper., Disp: 90 tablet, Rfl: 1  EXAM:   General impression: alert, cooperative and articulate.  No signs of being in distress  Lungs: speech is fluent sentence length suggests that patient is not short of breath and not punctuated by cough, sneezing or sniffing. Marland Kitchen   Psych: affect normal.  speech is articulate and non pressured .  Denies suicidal thoughts    ASSESSMENT  AND PLAN:  Discussed the following assessment and plan:  Anxiety as acute reaction to exceptional stress  Grief  Sleep disorder - Plan: Ambulatory referral to Sleep Studies, Ambulatory referral to Neurology  Anxiety as acute reaction to exceptional stress Improving with Paxil.  No changes today   Grief Improved with  counselling  By Lennice Sites.   Sleep disorder There is no report of snoring, but his wife reports increased nocturnal activity including talking and moving of arms and legs. He does not use stimulants other than caffeine during work,  And does not use any hypnotics/  Referral to Neurology and sleep study ordered.    I discussed the assessment and treatment plan with the patient. The patient was provided an opportunity to ask questions and all were answered. The patient agreed with the plan and demonstrated an understanding of the instructions.   The patient was advised to call back or seek an in-person evaluation if the symptoms worsen or if the condition fails to improve as anticipated.   Video connection was lost at < 50% of the duration of the visit ,  At which time the remainder of the visit was completed via audio only.   I provided 30 minutes of non-face-to-face time during this encounter.   Sherlene Shams, MD

## 2020-12-09 DIAGNOSIS — G479 Sleep disorder, unspecified: Secondary | ICD-10-CM | POA: Insufficient documentation

## 2020-12-09 NOTE — Assessment & Plan Note (Signed)
Improved with  counselling  By Lennice Sites.

## 2020-12-09 NOTE — Assessment & Plan Note (Addendum)
There is no report of snoring, but his wife reports increased nocturnal activity including talking and moving of arms and legs. He does not use stimulants other than caffeine during work,  And does not use any hypnotics/  Referral to Neurology and sleep study ordered.

## 2020-12-09 NOTE — Assessment & Plan Note (Signed)
Improving with Paxil.  No changes today

## 2020-12-10 ENCOUNTER — Other Ambulatory Visit: Payer: Self-pay

## 2020-12-10 ENCOUNTER — Encounter: Payer: Self-pay | Admitting: Otolaryngology

## 2020-12-14 ENCOUNTER — Other Ambulatory Visit: Payer: 59

## 2020-12-21 ENCOUNTER — Other Ambulatory Visit: Payer: Self-pay

## 2020-12-21 ENCOUNTER — Ambulatory Visit: Payer: 59 | Admitting: Internal Medicine

## 2020-12-21 ENCOUNTER — Other Ambulatory Visit
Admission: RE | Admit: 2020-12-21 | Discharge: 2020-12-21 | Disposition: A | Discharge status: Home / Self Care | Payer: 59 | Source: Ambulatory Visit | Attending: Otolaryngology | Admitting: Otolaryngology

## 2020-12-21 DIAGNOSIS — Z20822 Contact with and (suspected) exposure to covid-19: Secondary | ICD-10-CM | POA: Diagnosis not present

## 2020-12-21 DIAGNOSIS — Z01812 Encounter for preprocedural laboratory examination: Secondary | ICD-10-CM | POA: Insufficient documentation

## 2020-12-21 LAB — SARS CORONAVIRUS 2 (TAT 6-24 HRS): SARS Coronavirus 2: NEGATIVE

## 2020-12-24 ENCOUNTER — Ambulatory Visit (INDEPENDENT_AMBULATORY_CARE_PROVIDER_SITE_OTHER): Payer: 59 | Admitting: Psychology

## 2020-12-24 DIAGNOSIS — F4323 Adjustment disorder with mixed anxiety and depressed mood: Secondary | ICD-10-CM | POA: Diagnosis not present

## 2020-12-25 ENCOUNTER — Encounter
Admission: RE | Disposition: A | Discharge status: Home / Self Care | Payer: Self-pay | Source: Home / Self Care | Attending: Otolaryngology

## 2020-12-25 ENCOUNTER — Encounter: Payer: Self-pay | Admitting: Otolaryngology

## 2020-12-25 ENCOUNTER — Ambulatory Visit: Payer: 59 | Admitting: Anesthesiology

## 2020-12-25 ENCOUNTER — Other Ambulatory Visit: Payer: Self-pay | Admitting: Internal Medicine

## 2020-12-25 ENCOUNTER — Ambulatory Visit
Admission: RE | Admit: 2020-12-25 | Discharge: 2020-12-25 | Disposition: A | Discharge status: Home / Self Care | Payer: 59 | Attending: Otolaryngology | Admitting: Otolaryngology

## 2020-12-25 ENCOUNTER — Other Ambulatory Visit: Payer: Self-pay

## 2020-12-25 DIAGNOSIS — Z79899 Other long term (current) drug therapy: Secondary | ICD-10-CM | POA: Diagnosis not present

## 2020-12-25 DIAGNOSIS — J343 Hypertrophy of nasal turbinates: Secondary | ICD-10-CM | POA: Insufficient documentation

## 2020-12-25 DIAGNOSIS — J342 Deviated nasal septum: Secondary | ICD-10-CM | POA: Insufficient documentation

## 2020-12-25 HISTORY — DX: Tachycardia, unspecified: R00.0

## 2020-12-25 HISTORY — DX: Gilbert syndrome: E80.4

## 2020-12-25 HISTORY — DX: Irritable bowel syndrome, unspecified: K58.9

## 2020-12-25 HISTORY — DX: Anxiety disorder, unspecified: F41.9

## 2020-12-25 HISTORY — PX: NASAL SEPTOPLASTY W/ TURBINOPLASTY: SHX2070

## 2020-12-25 HISTORY — DX: Gastro-esophageal reflux disease without esophagitis: K21.9

## 2020-12-25 SURGERY — SEPTOPLASTY, NOSE, WITH NASAL TURBINATE REDUCTION
Anesthesia: General | Site: Nose | Laterality: Bilateral

## 2020-12-25 MED ORDER — ACETAMINOPHEN 10 MG/ML IV SOLN
1000.0000 mg | Freq: Once | INTRAVENOUS | Status: AC
  Administered 2020-12-25: 1000 mg via INTRAVENOUS

## 2020-12-25 MED ORDER — PROPOFOL 10 MG/ML IV BOLUS
INTRAVENOUS | Status: DC | PRN
  Administered 2020-12-25: 200 mg via INTRAVENOUS

## 2020-12-25 MED ORDER — DEXAMETHASONE SODIUM PHOSPHATE 4 MG/ML IJ SOLN
INTRAMUSCULAR | Status: DC | PRN
  Administered 2020-12-25: 8 mg via INTRAVENOUS

## 2020-12-25 MED ORDER — LIDOCAINE HCL (CARDIAC) PF 100 MG/5ML IV SOSY
PREFILLED_SYRINGE | INTRAVENOUS | Status: DC | PRN
  Administered 2020-12-25: 40 mg via INTRAVENOUS

## 2020-12-25 MED ORDER — LIDOCAINE-EPINEPHRINE 1 %-1:100000 IJ SOLN
INTRAMUSCULAR | Status: DC | PRN
  Administered 2020-12-25: 10 mL

## 2020-12-25 MED ORDER — DOXYCYCLINE HYCLATE 100 MG PO CAPS: ORAL_CAPSULE | ORAL | 0 refills | Status: DC

## 2020-12-25 MED ORDER — HYDROCODONE-ACETAMINOPHEN 5-325 MG PO TABS: 1.0000 | ORAL_TABLET | Freq: Four times a day (QID) | ORAL | 0 refills | Status: DC | PRN

## 2020-12-25 MED ORDER — OXYCODONE HCL 5 MG/5ML PO SOLN
5.0000 mg | Freq: Once | ORAL | Status: AC | PRN
  Administered 2020-12-25: 5 mg via ORAL

## 2020-12-25 MED ORDER — DEXMEDETOMIDINE HCL 200 MCG/2ML IV SOLN
INTRAVENOUS | Status: DC | PRN
  Administered 2020-12-25: 15 ug via INTRAVENOUS

## 2020-12-25 MED ORDER — FENTANYL CITRATE (PF) 100 MCG/2ML IJ SOLN
INTRAMUSCULAR | Status: DC | PRN
  Administered 2020-12-25 (×2): 50 ug via INTRAVENOUS

## 2020-12-25 MED ORDER — ACETAMINOPHEN 325 MG PO TABS: 325.0000 mg | ORAL_TABLET | ORAL | Status: DC | PRN

## 2020-12-25 MED ORDER — SUCCINYLCHOLINE CHLORIDE 20 MG/ML IJ SOLN
INTRAMUSCULAR | Status: DC | PRN
  Administered 2020-12-25: 100 mg via INTRAVENOUS

## 2020-12-25 MED ORDER — ONDANSETRON HCL 4 MG/2ML IJ SOLN
4.0000 mg | Freq: Once | INTRAMUSCULAR | Status: AC
  Administered 2020-12-25: 4 mg via INTRAVENOUS

## 2020-12-25 MED ORDER — SCOPOLAMINE 1 MG/3DAYS TD PT72
1.0000 | MEDICATED_PATCH | Freq: Once | TRANSDERMAL | Status: DC
  Administered 2020-12-25: 1.5 mg via TRANSDERMAL

## 2020-12-25 MED ORDER — MIDAZOLAM HCL 5 MG/5ML IJ SOLN
INTRAMUSCULAR | Status: DC | PRN
  Administered 2020-12-25: 2 mg via INTRAVENOUS

## 2020-12-25 MED ORDER — PROMETHAZINE HCL 25 MG/ML IJ SOLN: 6.2500 mg | Freq: Once | INTRAMUSCULAR | Status: DC | PRN

## 2020-12-25 MED ORDER — OXYMETAZOLINE HCL 0.05 % NA SOLN
NASAL | Status: DC | PRN
  Administered 2020-12-25: 1

## 2020-12-25 MED ORDER — GLYCOPYRROLATE 0.2 MG/ML IJ SOLN
INTRAMUSCULAR | Status: DC | PRN
  Administered 2020-12-25: .1 mg via INTRAVENOUS

## 2020-12-25 MED ORDER — ACETAMINOPHEN 160 MG/5ML PO SOLN: 325.0000 mg | ORAL | Status: DC | PRN

## 2020-12-25 MED ORDER — ONDANSETRON HCL 4 MG/2ML IJ SOLN
INTRAMUSCULAR | Status: DC | PRN
  Administered 2020-12-25: 4 mg via INTRAVENOUS

## 2020-12-25 MED ORDER — LACTATED RINGERS IV SOLN: INTRAVENOUS | Status: DC

## 2020-12-25 MED ORDER — FENTANYL CITRATE (PF) 100 MCG/2ML IJ SOLN
25.0000 ug | INTRAMUSCULAR | Status: DC | PRN
  Administered 2020-12-25: 50 ug via INTRAVENOUS
  Administered 2020-12-25: 25 ug via INTRAVENOUS

## 2020-12-25 MED ORDER — OXYCODONE HCL 5 MG PO TABS: 5.0000 mg | ORAL_TABLET | Freq: Once | ORAL | Status: AC | PRN

## 2020-12-25 SURGICAL SUPPLY — 17 items
CANISTER SUCT 1200ML W/VALVE (MISCELLANEOUS) ×2 IMPLANT
COAG SUCT 10F 3.5MM HAND CTRL (MISCELLANEOUS) ×2 IMPLANT
ELECT REM PT RETURN 9FT ADLT (ELECTROSURGICAL) ×2
ELECTRODE REM PT RTRN 9FT ADLT (ELECTROSURGICAL) ×1 IMPLANT
GLOVE BIO SURGEON STRL SZ7.5 (GLOVE) ×4 IMPLANT
GOWN STRL REUS W/ TWL LRG LVL3 (GOWN DISPOSABLE) ×1 IMPLANT
GOWN STRL REUS W/TWL LRG LVL3 (GOWN DISPOSABLE) ×2
KIT TURNOVER KIT A (KITS) ×2 IMPLANT
PACK ENT CUSTOM (PACKS) ×2 IMPLANT
PATTIES SURGICAL .5 X3 (DISPOSABLE) ×2 IMPLANT
SPLINT NASAL SEPTAL PRE-CUT (MISCELLANEOUS) ×2 IMPLANT
SUT CHROMIC 4 0 RB 1X27 (SUTURE) ×1 IMPLANT
SUT ETHILON 4-0 (SUTURE) ×2
SUT ETHILON 4-0 FS2 18XMFL BLK (SUTURE) ×1
SUT PLAIN GUT 4-0 (SUTURE) ×1 IMPLANT
SUTURE ETHLN 4-0 FS2 18XMF BLK (SUTURE) IMPLANT
WATER STERILE IRR 250ML POUR (IV SOLUTION) ×2 IMPLANT

## 2020-12-25 NOTE — Anesthesia Procedure Notes (Signed)
Procedure Name: Intubation Date/Time: 12/25/2020 8:14 AM Performed by: Jimmy Picket, CRNA Pre-anesthesia Checklist: Patient identified, Emergency Drugs available, Suction available, Patient being monitored and Timeout performed Patient Re-evaluated:Patient Re-evaluated prior to induction Oxygen Delivery Method: Circle system utilized Preoxygenation: Pre-oxygenation with 100% oxygen Induction Type: IV induction Ventilation: Mask ventilation without difficulty Laryngoscope Size: Miller and 3 Grade View: Grade I Tube type: Oral Rae Tube size: 8.0 mm Number of attempts: 1 Placement Confirmation: ETT inserted through vocal cords under direct vision,  positive ETCO2 and breath sounds checked- equal and bilateral Tube secured with: Tape Dental Injury: Teeth and Oropharynx as per pre-operative assessment

## 2020-12-25 NOTE — Transfer of Care (Signed)
Immediate Anesthesia Transfer of Care Note  Patient: Christian Montgomery  Procedure(s) Performed: NASAL SEPTOPLASTY WITH TURBINATE REDUCTION (Bilateral Nose)  Patient Location: PACU  Anesthesia Type: General  Level of Consciousness: awake, alert  and patient cooperative  Airway and Oxygen Therapy: Patient Spontanous Breathing and Patient connected to supplemental oxygen  Post-op Assessment: Post-op Vital signs reviewed, Patient's Cardiovascular Status Stable, Respiratory Function Stable, Patent Airway and No signs of Nausea or vomiting  Post-op Vital Signs: Reviewed and stable  Complications: No complications documented.

## 2020-12-25 NOTE — Anesthesia Postprocedure Evaluation (Signed)
Anesthesia Post Note  Patient: Christian Montgomery  Procedure(s) Performed: NASAL SEPTOPLASTY WITH TURBINATE REDUCTION (Bilateral Nose)     Patient location during evaluation: PACU Anesthesia Type: General Level of consciousness: awake Pain management: pain level controlled Vital Signs Assessment: post-procedure vital signs reviewed and stable Respiratory status: respiratory function stable Cardiovascular status: stable Postop Assessment: no signs of nausea or vomiting Anesthetic complications: no   No complications documented.  Jola Babinski

## 2020-12-25 NOTE — Anesthesia Preprocedure Evaluation (Signed)
Anesthesia Evaluation  Patient identified by MRN, date of birth, ID band Patient awake    Reviewed: Allergy & Precautions, NPO status , Patient's Chart, lab work & pertinent test results  Airway Mallampati: II  TM Distance: >3 FB     Dental   Pulmonary neg pulmonary ROS, neg recent URI,    breath sounds clear to auscultation       Cardiovascular Exercise Tolerance: Good hypertension,  Rhythm:Regular Rate:Normal  Dysautonomia/POTS   Neuro/Psych  Headaches, Anxiety    GI/Hepatic GERD  ,Nonalcoholic fatty liver IBS  Sullivan Lone syndrome   Endo/Other  Hypothyroidism   Renal/GU      Musculoskeletal  (+) Arthritis ,   Abdominal   Peds  Hematology   Anesthesia Other Findings   Reproductive/Obstetrics                             Anesthesia Physical Anesthesia Plan  ASA: II  Anesthesia Plan: General   Post-op Pain Management:    Induction: Intravenous  PONV Risk Score and Plan: Ondansetron, Dexamethasone, Midazolam and Treatment may vary due to age or medical condition  Airway Management Planned: Oral ETT  Additional Equipment:   Intra-op Plan:   Post-operative Plan:   Informed Consent: I have reviewed the patients History and Physical, chart, labs and discussed the procedure including the risks, benefits and alternatives for the proposed anesthesia with the patient or authorized representative who has indicated his/her understanding and acceptance.     Dental advisory given  Plan Discussed with: CRNA  Anesthesia Plan Comments:         Anesthesia Quick Evaluation

## 2020-12-25 NOTE — Op Note (Signed)
12/25/2020  9:24 AM    [name]  [id number]   Pre-Op Diagnosis:  Nasal obstruction with septal deviation and inferior turbinate hypertrophy  Post-op Diagnosis: Same  Procedure: 1) Nasal Septoplasty, 2) Bilateral submucous resection of the inferior turbinates  Surgeon:  Sandi Mealy  Anesthesia:  General endotracheal  EBL:  Less than 20cc  Complications:  None  Findings: Right septal deviation. Hypertrophy of the inferior turbinates  Procedure: The patient was taken to the Operating Room and placed in the supine position.  After induction of general endotracheal anesthesia, the table was turned 90 degrees. A time-out was issued to confirm the site and procedure. The nasal septum and inferior turbinates where then injected with 1% lidocaine with epiniephrine, 1:100,000. The nose was decongested with Afrin soaked pledgets. The patient was then prepped and draped in the usual sterile fashion. Beginning on the left hand side a hemitransfixion incision was then created on the leading edge of the septum on the left.  A subperichondrial plane was elevated posteriorly on the left and taken back to the perpendicular plate of the ethmoid where subperiosteal plane was elevated posteriorly on the left. A large septal spur was identified on the right hand side.  An inferior rim of redundant septal cartilage was removed from where it deviated over the maxillary crest. The perpendicular plate of the ethmoid was separated from the quadrangular cartilage, and a subperiosteal plane elevated on the right of the bony septum. The large septal spur was removed, dividing the septal bone superiorly with Knight scissors, and inferiorly from the maxillary crest with a chisel.    The left side of the septal cartilage was scored to weaken its memory, and relax the rightward curvature of the septal cartilage. The septum was then replaced in the midline.  Reinspection through each nostril showed excellent  reduction of the septal deformity. The septal incision was closed with 4-0 chromic gut suture. A 4-0 plain gut suture was used to reapproximate the septal flaps to the underlying cartilage, utilizing a running, quilting type stitch.   With the septoplasty completed, beginning on the left-hand side, a 15 blade was used to incise along the inferior edge of the inferior turbinate. A superior laterally based flap of the medial turbinate mucosa was then elevated. A portion of the underlying conchal bone and lateral mucosa was excised using Knight scissors. The flap was then laid back over the turbinate stump and the bleeding edge of the turbinate cauterized using suction cautery. In a similar fashion the submucous resection was performed on the right.  With the submucous resection completed bilaterally and no active bleeding, nasal septal splints were placed within each nostril and affixed to the septum using a 3-0 nylon suture.     The patient was then returned to the anesthesiologist for awakening, and was taken to the Recovery Room in stable condition.  Disposition:   PACU to home  Plan: Take pain medications and antibiotics as prescribed. May apply ice to nasal dorsum as needed for pain/swelling. Begin saline irrigation 3 times daily starting tomorrow. No strenuous activity for 2 weeks. Follow-up in 1 week for splint removal.   Sandi Mealy 12/25/2020 9:24 AM

## 2020-12-25 NOTE — Discharge Instructions (Signed)
[facility] °[facility] °ENDOSCOPIC SINUS SURGERY °[facility] ° °What is Functional Endoscopic Sinus Surgery? ° The Surgery involves making the natural openings of the sinuses larger by removing the bony partitions that separate the sinuses from the nasal cavity.  The natural sinus lining is preserved as much as possible to allow the sinuses to resume normal function after the surgery.  In some patients nasal polyps (excessively swollen lining of the sinuses) may be removed to relieve obstruction of the sinus openings.  The surgery is performed through the nose using lighted scopes, which eliminates the need for incisions on the face.  A septoplasty is a different procedure which is sometimes performed with sinus surgery.  It involves straightening the boy partition that separates the two sides of your nose.  A crooked or deviated septum may need repair if is obstructing the sinuses or nasal airflow.  Turbinate reduction is also often performed during sinus surgery.  The turbinates are bony proturberances from the side walls of the nose which swell and can obstruct the nose in patients with sinus and allergy problems.  Their size can be surgically reduced to help relieve nasal obstruction. ° °What Can Sinus Surgery Do For Me? ° Sinus surgery can reduce the frequency of sinus infections requiring antibiotic treatment.  This can provide improvement in nasal congestion, post-nasal drainage, facial pressure and nasal obstruction.  Surgery will NOT prevent you from ever having an infection again, so it usually only for patients who get infections 4 or more times yearly requiring antibiotics, or for infections that do not clear with antibiotics.  It will not cure nasal allergies, so patients with allergies may still require medication to treat their allergies after surgery. Surgery may improve headaches related to sinusitis, however, some people will continue to  require medication to control sinus headaches related to allergies.  Surgery will do nothing for other forms of headache (migraine, tension or cluster). ° °What Are the Risks of Endoscopic Sinus Surgery? ° Current techniques allow surgery to be performed safely with little risk, however, there are rare complications that patients should be aware of.  Because the sinuses are located around the eyes, there is risk of eye injury, including blindness, though again, this would be quite rare. This is usually a result of bleeding behind the eye during surgery, which puts the vision oat risk, though there are treatments to protect the vision and prevent permanent disrupted by surgery causing a leak of the spinal fluid that surrounds the brain.  More serious complications would include bleeding inside the brain cavity or damage to the brain.  Again, all of these complications are uncommon, and spinal fluid leaks can be safely managed surgically if they occur.  The most common complication of sinus surgery is bleeding from the nose, which may require packing or cauterization of the nose.  Continued sinus have polyps may experience recurrence of the polyps requiring revision surgery.  Alterations of sense of smell or injury to the tear ducts are also rare complications.  ° °What is the Surgery Like, and what is the Recovery? ° The Surgery usually takes a couple of hours to perform, and is usually performed under a general anesthetic (completely asleep).  Patients are usually discharged home after a couple of hours.  Sometimes during surgery it is necessary to pack the nose to control bleeding, and the packing is left in place for 24 - 48 hours, and removed by your surgeon.    If a septoplasty was performed during the procedure, there is often a splint placed which must be removed after 5-7 days.   °Discomfort: Pain is usually mild to moderate, and can be controlled by prescription pain medication or acetaminophen (Tylenol).   Aspirin, Ibuprofen (Advil, Motrin), or Naprosyn (Aleve) should be avoided, as they can cause increased bleeding.  Most patients feel sinus pressure like they have a bad head cold for several days.  Sleeping with your head elevated can help reduce swelling and facial pressure, as can ice packs over the face.  A humidifier may be helpful to keep the mucous and blood from drying in the nose.  ° °Diet: There are no specific diet restrictions, however, you should generally start with clear liquids and a light diet of bland foods because the anesthetic can cause some nausea.  Advance your diet depending on how your stomach feels.  Taking your pain medication with food will often help reduce stomach upset which pain medications can cause. ° °Nasal Saline Irrigation: It is important to remove blood clots and dried mucous from the nose as it is healing.  This is done by having you irrigate the nose at least 3 - 4 times daily with a salt water solution.  We recommend using NeilMed Sinus Rinse (available at the drug store).  Fill the squeeze bottle with the solution, bend over a sink, and insert the tip of the squeeze bottle into the nose ½ of an inch.  Point the tip of the squeeze bottle towards the inside corner of the eye on the same side your irrigating.  Squeeze the bottle and gently irrigate the nose.  If you bend forward as you do this, most of the fluid will flow back out of the nose, instead of down your throat.   The solution should be warm, near body temperature, when you irrigate.   Each time you irrigate, you should use a full squeeze bottle.  ° °Note that if you are instructed to use Nasal Steroid Sprays at any time after your surgery, irrigate with saline BEFORE using the steroid spray, so you do not wash it all out of the nose. °Another product, Nasal Saline Gel (such as AYR Nasal Saline Gel) can be applied in each nostril 3 - 4 times daily to moisture the nose and reduce scabbing or crusting. ° °Bleeding:   Bloody drainage from the nose can be expected for several days, and patients are instructed to irrigate their nose frequently with salt water to help remove mucous and blood clots.  The drainage may be dark red or brown, though some fresh blood may be seen intermittently, especially after irrigation.  Do not blow you nose, as bleeding may occur. If you must sneeze, keep your mouth open to allow air to escape through your mouth. ° °If heavy bleeding occurs: Irrigate the nose with saline to rinse out clots, then spray the nose 3 - 4 times with Afrin Nasal Decongestant Spray.  The spray will constrict the blood vessels to slow bleeding.  Pinch the lower half of your nose shut to apply pressure, and lay down with your head elevated.  Ice packs over the nose may help as well. If bleeding persists despite these measures, you should notify your doctor.  Do not use the Afrin routinely to control nasal congestion after surgery, as it can result in worsening congestion and may affect healing.  ° ° ° °Activity: Return to work varies among patients. Most patients will be   work at least 5 - 7 days to recover.  Patient may return to work after they are off of narcotic pain medication, and feeling well enough to perform the functions of their job.  Patients must avoid heavy lifting (over 10 pounds) or strenuous physical for 2 weeks after surgery, so your employer may need to assign you to light duty, or keep you out of work longer if light duty is not possible.  NOTE: you should not drive, operate dangerous machinery, do any mentally demanding tasks or make any important legal or financial decisions while on narcotic pain medication and recovering from the general anesthetic.    Call Your Doctor Immediately if You Have Any of the Following: 1. Bleeding that you cannot control with the above measures 2. Loss of vision, double vision, bulging of the eye or black eyes. 3. Fever over 101 degrees 4. Neck stiffness with severe  headache, fever, nausea and change in mental state. You are always encourage to call anytime with concerns, however, please call with requests for pain medication refills during office hours.  Office Endoscopy: During follow-up visits your doctor will remove any packing or splints that may have been placed and evaluate and clean your sinuses endoscopically.  Topical anesthetic will be used to make this as comfortable as possible, though you may want to take your pain medication prior to the visit.  How often this will need to be done varies from patient to patient.  After complete recovery from the surgery, you may need follow-up endoscopy from time to time, particularly if there is concern of recurrent infection or nasal polyps.  Scopolamine skin patches REMOVE PATCH IN 72 HOURS AND WASH HANDS IMMEDIATELY  What is this medicine? SCOPOLAMINE (skoe POL a meen) is used to prevent nausea and vomiting caused by motion sickness, anesthesia and surgery. This medicine may be used for other purposes; ask your health care provider or pharmacist if you have questions. COMMON BRAND NAME(S): Transderm Scop What should I tell my health care provider before I take this medicine? They need to know if you have any of these conditions:  are scheduled to have a gastric secretion test  glaucoma  heart disease  kidney disease  liver disease  lung or breathing disease, like asthma  mental illness  prostate disease  seizures  stomach or intestine problems  trouble passing urine  an unusual or allergic reaction to scopolamine, atropine, other medicines, foods, dyes, or preservatives  pregnant or trying to get pregnant  breast-feeding How should I use this medicine? This medicine is for external use only. Follow the directions on the prescription label. Wear only 1 patch at a time. Choose an area behind the ear, that is clean, dry, hairless and free from any cuts or irritation. Wipe the area with  a clean dry tissue. Peel off the plastic backing of the skin patch, trying not to touch the adhesive side with your hands. Do not cut the patches. Firmly apply to the area you have chosen, with the metallic side of the patch to the skin and the tan-colored side showing. Once firmly in place, wash your hands well with soap and water. Do not get this medicine into your eyes. After removing the patch, wash your hands and the area behind your ear thoroughly with soap and water. The patch will still contain some medicine after use. To avoid accidental contact or ingestion by children or pets, fold the used patch in half with the sticky side together   and throw away in the trash out of the reach of children and pets. If you need to use a second patch after you remove the first, place it behind the other ear. A special MedGuide will be given to you by the pharmacist with each prescription and refill. Be sure to read this information carefully each time. Talk to your pediatrician regarding the use of this medicine in children. Special care may be needed. Overdosage: If you think you have taken too much of this medicine contact a poison control center or emergency room at once. NOTE: This medicine is only for you. Do not share this medicine with others. What if I miss a dose? This does not apply. This medicine is not for regular use. What may interact with this medicine?  alcohol  antihistamines for allergy cough and cold  atropine  certain medicines for anxiety or sleep  certain medicines for bladder problems like oxybutynin, tolterodine  certain medicines for depression like amitriptyline, fluoxetine, sertraline  certain medicines for stomach problems like dicyclomine, hyoscyamine  certain medicines for Parkinson's disease like benztropine, trihexyphenidyl  certain medicines for seizures like phenobarbital, primidone  general anesthetics like halothane, isoflurane, methoxyflurane,  propofol  ipratropium  local anesthetics like lidocaine, pramoxine, tetracaine  medicines that relax muscles for surgery  phenothiazines like chlorpromazine, mesoridazine, prochlorperazine, thioridazine  narcotic medicines for pain  other belladonna alkaloids This list may not describe all possible interactions. Give your health care provider a list of all the medicines, herbs, non-prescription drugs, or dietary supplements you use. Also tell them if you smoke, drink alcohol, or use illegal drugs. Some items may interact with your medicine. What should I watch for while using this medicine? Limit contact with water while swimming and bathing because the patch may fall off. If the patch falls off, throw it away and put a new one behind the other ear. You may get drowsy or dizzy. Do not drive, use machinery, or do anything that needs mental alertness until you know how this medicine affects you. Do not stand or sit up quickly, especially if you are an older patient. This reduces the risk of dizzy or fainting spells. Alcohol may interfere with the effect of this medicine. Avoid alcoholic drinks. Your mouth may get dry. Chewing sugarless gum or sucking hard candy, and drinking plenty of water may help. Contact your healthcare professional if the problem does not go away or is severe. This medicine may cause dry eyes and blurred vision. If you wear contact lenses, you may feel some discomfort. Lubricating drops may help. See your healthcare professional if the problem does not go away or is severe. If you are going to need surgery, an MRI, CT scan, or other procedure, tell your healthcare professional that you are using this medicine. You may need to remove the patch before the procedure. What side effects may I notice from receiving this medicine? Side effects that you should report to your doctor or health care professional as soon as possible:  allergic reactions like skin rash, itching or  hives; swelling of the face, lips, or tongue  blurred vision  changes in vision  confusion  dizziness  eye pain  fast, irregular heartbeat  hallucinations, loss of contact with reality  nausea, vomiting  pain or trouble passing urine  restlessness  seizures  skin irritation  stomach pain Side effects that usually do not require medical attention (report to your doctor or health care professional if they continue or are bothersome):    drowsiness  dry mouth  headache  sore throat This list may not describe all possible side effects. Call your doctor for medical advice about side effects. You may report side effects to FDA at 1-800-FDA-1088. Where should I keep my medicine? Keep out of the reach of children. Store at room temperature between 20 and 25 degrees C (68 and 77 degrees F). Keep this medicine in the foil package until ready to use. Throw away any unused medicine after the expiration date. NOTE: This sheet is a summary. It may not cover all possible information. If you have questions about this medicine, talk to your doctor, pharmacist, or health care provider.  2021 Elsevier/Gold Standard (2018-02-05 16:14:46)   General Anesthesia, Adult, Care After This sheet gives you information about how to care for yourself after your procedure. Your health care provider may also give you more specific instructions. If you have problems or questions, contact your health care provider. What can I expect after the procedure? After the procedure, the following side effects are common:  Pain or discomfort at the IV site.  Nausea.  Vomiting.  Sore throat.  Trouble concentrating.  Feeling cold or chills.  Feeling weak or tired.  Sleepiness and fatigue.  Soreness and body aches. These side effects can affect parts of the body that were not involved in surgery. Follow these instructions at home: For the time period you were told by your health care  provider:  Rest.  Do not participate in activities where you could fall or become injured.  Do not drive or use machinery.  Do not drink alcohol.  Do not take sleeping pills or medicines that cause drowsiness.  Do not make important decisions or sign legal documents.  Do not take care of children on your own.   Eating and drinking  Follow any instructions from your health care provider about eating or drinking restrictions.  When you feel hungry, start by eating small amounts of foods that are soft and easy to digest (bland), such as toast. Gradually return to your regular diet.  Drink enough fluid to keep your urine pale yellow.  If you vomit, rehydrate by drinking water, juice, or clear broth. General instructions  If you have sleep apnea, surgery and certain medicines can increase your risk for breathing problems. Follow instructions from your health care provider about wearing your sleep device: ? Anytime you are sleeping, including during daytime naps. ? While taking prescription pain medicines, sleeping medicines, or medicines that make you drowsy.  Have a responsible adult stay with you for the time you are told. It is important to have someone help care for you until you are awake and alert.  Return to your normal activities as told by your health care provider. Ask your health care provider what activities are safe for you.  Take over-the-counter and prescription medicines only as told by your health care provider.  If you smoke, do not smoke without supervision.  Keep all follow-up visits as told by your health care provider. This is important. Contact a health care provider if:  You have nausea or vomiting that does not get better with medicine.  You cannot eat or drink without vomiting.  You have pain that does not get better with medicine.  You are unable to pass urine.  You develop a skin rash.  You have a fever.  You have redness around your IV site  that gets worse. Get help right away if:  You have difficulty breathing.    You have chest pain.  You have blood in your urine or stool, or you vomit blood. Summary  After the procedure, it is common to have a sore throat or nausea. It is also common to feel tired.  Have a responsible adult stay with you for the time you are told. It is important to have someone help care for you until you are awake and alert.  When you feel hungry, start by eating small amounts of foods that are soft and easy to digest (bland), such as toast. Gradually return to your regular diet.  Drink enough fluid to keep your urine pale yellow.  Return to your normal activities as told by your health care provider. Ask your health care provider what activities are safe for you. This information is not intended to replace advice given to you by your health care provider. Make sure you discuss any questions you have with your health care provider. Document Revised: 08/02/2020 Document Reviewed: 03/01/2020 Elsevier Patient Education  2021 [employer].

## 2020-12-25 NOTE — H&P (Signed)
History and physical reviewed and will be scanned in later. No change in medical status reported by the patient or family, appears stable for surgery. All questions regarding the procedure answered, and patient (or family if a child) expressed understanding of the procedure. ? ?Christian Montgomery S Christian Montgomery ?[email or web address]@ ?

## 2020-12-26 ENCOUNTER — Encounter: Payer: Self-pay | Admitting: Otolaryngology

## 2020-12-29 ENCOUNTER — Other Ambulatory Visit: Payer: Self-pay | Admitting: Internal Medicine

## 2020-12-31 ENCOUNTER — Telehealth: Payer: Self-pay

## 2020-12-31 NOTE — Telephone Encounter (Signed)
Notes have been faxed to [facility]

## 2020-12-31 NOTE — Telephone Encounter (Signed)
[name] with the sleep study center called and states that they need more clinicals for the pt so [employer] would approve the sleep study. She wants them by tomorrow. Please [phone] ext [phone]

## 2020-12-31 NOTE — Telephone Encounter (Signed)
Called and spoke to [place]. She asks for more clinical notes referencing the patients sleep related issues. She asks for the notes to be faxed to [phone].

## 2021-01-04 ENCOUNTER — Other Ambulatory Visit: Payer: Self-pay

## 2021-01-07 ENCOUNTER — Ambulatory Visit (INDEPENDENT_AMBULATORY_CARE_PROVIDER_SITE_OTHER): Payer: 59 | Admitting: Internal Medicine

## 2021-01-07 ENCOUNTER — Other Ambulatory Visit: Payer: Self-pay

## 2021-01-07 ENCOUNTER — Encounter: Payer: Self-pay | Admitting: Internal Medicine

## 2021-01-07 ENCOUNTER — Other Ambulatory Visit: Payer: 59

## 2021-01-07 ENCOUNTER — Ambulatory Visit (INDEPENDENT_AMBULATORY_CARE_PROVIDER_SITE_OTHER): Payer: 59 | Admitting: Psychology

## 2021-01-07 VITALS — BP 92/70 | HR 71 | Temp 98.1°F | Ht 74.02 in | Wt 167.8 lb

## 2021-01-07 DIAGNOSIS — K760 Fatty (change of) liver, not elsewhere classified: Secondary | ICD-10-CM | POA: Diagnosis not present

## 2021-01-07 DIAGNOSIS — G479 Sleep disorder, unspecified: Secondary | ICD-10-CM

## 2021-01-07 DIAGNOSIS — Z113 Encounter for screening for infections with a predominantly sexual mode of transmission: Secondary | ICD-10-CM | POA: Diagnosis not present

## 2021-01-07 DIAGNOSIS — F4323 Adjustment disorder with mixed anxiety and depressed mood: Secondary | ICD-10-CM

## 2021-01-07 DIAGNOSIS — R258 Other abnormal involuntary movements: Secondary | ICD-10-CM

## 2021-01-07 DIAGNOSIS — R400 Somnolence: Secondary | ICD-10-CM

## 2021-01-07 DIAGNOSIS — G901 Familial dysautonomia [Riley-Day]: Secondary | ICD-10-CM | POA: Diagnosis not present

## 2021-01-07 DIAGNOSIS — Z72821 Inadequate sleep hygiene: Secondary | ICD-10-CM

## 2021-01-07 DIAGNOSIS — R000 Tachycardia, unspecified: Secondary | ICD-10-CM

## 2021-01-07 DIAGNOSIS — Z0283 Encounter for blood-alcohol and blood-drug test: Secondary | ICD-10-CM

## 2021-01-07 NOTE — Progress Notes (Signed)
Presents to [facility] [employer] [facility] for on-site pre-employment drug screen.  [facility] Acct # [id number] [facility] Specimen # [id number]  AMD

## 2021-01-07 NOTE — Progress Notes (Signed)
Subjective:  Patient ID: Christian Montgomery, male    DOB: 11-Dec-1990  Age: 30 y.o. MRN: [id number]  CC: There were no encounter diagnoses.  HPI MAVEN VARELAS presents for follow up on multiple issues , including anxiety/depression, hypertension and sleep disorder.  This visit occurred during the SARS-CoV-2 public health emergency.  Safety protocols were in place, including screening questions prior to the visit, additional usage of staff PPE, and extensive cleaning of exam room while observing appropriate contact time as indicated for disinfecting solutions.     Sinus surgery jan 25 by Willeen Cass for turbinate reduction and septoplasty.  He had the packing removed after one week . Rinsing with neil meds sinus rinse twice daily .  Feeling better    Sleep disorder: he was referred for an in lab study but his Insurance would not approve the sleep study with a home test first.   At last visit was Rhe was referred for sleep and and neurology for sleep issues.  He states today that he and his wife have also been also noticing a tic (head movement )  Occurring for years  BP has been low since starting metoprolol .   Requesting testing for HIV/Hep C    Outpatient Medications Prior to Visit  Medication Sig Dispense Refill   azelastine (ASTELIN) 0.1 % nasal spray Place into both nostrils 2 (two) times daily. Use in each nostril as directed     dicyclomine (BENTYL) 10 MG capsule TAKE 1 CAPSULE BY MOUTH FOUR TIMES A DAY, BEFORE MEALS AND BEDTIME 90 capsule 1   metoprolol succinate (TOPROL-XL) 25 MG 24 hr tablet TAKE 1 TABLET BY MOUTH EVERY DAY 90 tablet 2   montelukast (SINGULAIR) 10 MG tablet TAKE 1 TABLET BY MOUTH EVERY DAY AT BEDTIME 90 tablet 3   Multiple Vitamins-Minerals (MULTIVITAMIN ADULTS PO) Take by mouth.     pantoprazole (PROTONIX) 40 MG tablet TAKE 1 TABLET BY MOUTH EVERY DAY 90 tablet 1   PARoxetine (PAXIL) 10 MG tablet TAKE 1 TABLET (10 MG TOTAL) BY MOUTH DAILY WITH SUPPER. 90  tablet 1   Probiotic Product (PROBIOTIC DAILY PO) Take by mouth.     Simethicone (GAS-X PO) Take by mouth.     doxycycline (VIBRAMYCIN) 100 MG capsule 100 mg PO BID x 10 days (Patient not taking: Reported on 01/07/2021) 20 capsule 0   HYDROcodone-acetaminophen (NORCO/VICODIN) 5-325 MG tablet Take 1-2 tablets by mouth every 6 (six) hours as needed for moderate pain. (Patient not taking: Reported on 01/07/2021) 40 tablet 0   No facility-administered medications prior to visit.    Review of Systems;  Patient denies headache, fevers, malaise, unintentional weight loss, skin rash, eye pain, sinus congestion and sinus pain, sore throat, dysphagia,  hemoptysis , cough, dyspnea, wheezing, chest pain, palpitations, orthopnea, edema, abdominal pain, nausea, melena, diarrhea, constipation, flank pain, dysuria, hematuria, urinary  Frequency, nocturia, numbness, tingling, seizures,  Focal weakness, Loss of consciousness,  Tremor, insomnia, depression, anxiety, and suicidal ideation.      Objective:  BP 92/70 (BP Location: Left Arm, Patient Position: Sitting)    Pulse 71    Temp 98.1 F (36.7 C)    Ht 6' 2.02" (1.88 m)    Wt 167 lb 12.8 oz (76.1 kg)    SpO2 99%    BMI 21.53 kg/m   BP Readings from Last 3 Encounters:  01/07/21 92/70  12/25/20 121/80  12/07/20 (!) 163/95    Wt Readings from Last 3 Encounters:  01/07/21 167  lb 12.8 oz (76.1 kg)  12/25/20 165 lb (74.8 kg)  12/07/20 165 lb (74.8 kg)    General appearance: alert, cooperative and appears stated age Ears: normal TM's and external ear canals both ears Throat: lips, mucosa, and tongue normal; teeth and gums normal Neck: no adenopathy, no carotid bruit, supple, symmetrical, trachea midline and thyroid not enlarged, symmetric, no tenderness/mass/nodules Back: symmetric, no curvature. ROM normal. No CVA tenderness. Lungs: clear to auscultation bilaterally Heart: regular rate and rhythm, S1, S2 normal, no murmur, click, rub or  gallop Abdomen: soft, non-tender; bowel sounds normal; no masses,  no organomegaly Pulses: 2+ and symmetric Skin: Skin color, texture, turgor normal. No rashes or lesions Lymph nodes: Cervical, supraclavicular, and axillary nodes normal.  Lab Results  Component Value Date   HGBA1C 5.4 05/27/2013   HGBA1C 5.4 09/21/2012    Lab Results  Component Value Date   CREATININE 1.13 01/23/2020   CREATININE 0.97 12/13/2019   CREATININE 0.94 10/19/2019    Lab Results  Component Value Date   WBC 3.7 (L) 01/23/2020   HGB 15.6 01/23/2020   HCT 46.4 01/23/2020   PLT 168.0 01/23/2020   GLUCOSE 102 (H) 01/23/2020   CHOL 113 01/23/2020   TRIG 56.0 01/23/2020   HDL 49.70 01/23/2020   LDLCALC 52 01/23/2020   ALT 12 01/23/2020   AST 13 01/23/2020   NA 140 01/23/2020   K 4.4 01/23/2020   CL 102 01/23/2020   CREATININE 1.13 01/23/2020   BUN 14 01/23/2020   CO2 30 01/23/2020   TSH 2.10 01/23/2020   INR 1.3 (H) 02/24/2014   HGBA1C 5.4 05/27/2013    No results found.  Assessment & Plan:   Problem List Items Addressed This Visit   None     I have discontinued Dorene Sorrow L. Niess's HYDROcodone-acetaminophen and doxycycline. I am also having him maintain his montelukast, pantoprazole, metoprolol succinate, Multiple Vitamins-Minerals (MULTIVITAMIN ADULTS PO), Probiotic Product (PROBIOTIC DAILY PO), Simethicone (GAS-X PO), azelastine, dicyclomine, and PARoxetine.  No orders of the defined types were placed in this encounter.   Medications Discontinued During This Encounter  Medication Reason   doxycycline (VIBRAMYCIN) 100 MG capsule Error   HYDROcodone-acetaminophen (NORCO/VICODIN) 5-325 MG tablet Error    Follow-up: No follow-ups on file.   Sherlene Shams, MD

## 2021-01-07 NOTE — Patient Instructions (Addendum)
Suspend the metoprolol for blood pressure since your blood pressure is too low  Try eating oranges (raw or burned in skillet) to get your taste back  Home Sleep study has been ordered

## 2021-01-08 LAB — CBC WITH DIFFERENTIAL/PLATELET
Basophils Absolute: 0 10*3/uL (ref 0.0–0.1)
Basophils Relative: 0.7 % (ref 0.0–3.0)
Eosinophils Absolute: 0.1 10*3/uL (ref 0.0–0.7)
Eosinophils Relative: 1.3 % (ref 0.0–5.0)
HCT: 43.4 % (ref 39.0–52.0)
Hemoglobin: 14.7 g/dL (ref 13.0–17.0)
Lymphocytes Relative: 23.8 % (ref 12.0–46.0)
Lymphs Abs: 1.2 10*3/uL (ref 0.7–4.0)
MCHC: 33.8 g/dL (ref 30.0–36.0)
MCV: 91.4 fl (ref 78.0–100.0)
Monocytes Absolute: 0.4 10*3/uL (ref 0.1–1.0)
Monocytes Relative: 7.4 % (ref 3.0–12.0)
Neutro Abs: 3.4 10*3/uL (ref 1.4–7.7)
Neutrophils Relative %: 66.8 % (ref 43.0–77.0)
Platelets: 226 10*3/uL (ref 150.0–400.0)
RBC: 4.75 Mil/uL (ref 4.22–5.81)
RDW: 12.2 % (ref 11.5–15.5)
WBC: 5 10*3/uL (ref 4.0–10.5)

## 2021-01-08 LAB — COMPREHENSIVE METABOLIC PANEL
ALT: 16 U/L (ref 0–53)
AST: 15 U/L (ref 0–37)
Albumin: 4.5 g/dL (ref 3.5–5.2)
Alkaline Phosphatase: 49 U/L (ref 39–117)
BUN: 11 mg/dL (ref 6–23)
CO2: 33 mEq/L — ABNORMAL HIGH (ref 19–32)
Calcium: 9.3 mg/dL (ref 8.4–10.5)
Chloride: 101 mEq/L (ref 96–112)
Creatinine, Ser: 0.96 mg/dL (ref 0.40–1.50)
GFR: 104.9 mL/min (ref >60.00)
Glucose, Bld: 81 mg/dL (ref 70–99)
Potassium: 4.2 mEq/L (ref 3.5–5.1)
Sodium: 138 mEq/L (ref 135–145)
Total Bilirubin: 0.8 mg/dL (ref 0.2–1.2)
Total Protein: 7 g/dL (ref 6.0–8.3)

## 2021-01-08 LAB — LIPID PANEL
Cholesterol: 134 mg/dL (ref 0–200)
HDL: 44.4 mg/dL (ref >39.00)
LDL Cholesterol: 76 mg/dL (ref 0–99)
NonHDL: 89.38
Total CHOL/HDL Ratio: 3
Triglycerides: 69 mg/dL (ref 0.0–149.0)
VLDL: 13.8 mg/dL (ref 0.0–40.0)

## 2021-01-08 LAB — TSH: TSH: 2.69 u[IU]/mL (ref 0.35–4.50)

## 2021-01-08 NOTE — Assessment & Plan Note (Signed)
Sleep study and neurologicfollow up needed.  May have RLS or PLM along with OSA

## 2021-01-08 NOTE — Assessment & Plan Note (Addendum)
He notes low BP readings since starting low dose metoprolol .    Will suspend medicatio n and follow pulse ,  BP

## 2021-01-08 NOTE — Assessment & Plan Note (Signed)
Unclear if recent recurrent episodes are due to return of POTS.  Prn propranolol given hypotension with scheduled metoprolol

## 2021-01-09 LAB — HEPATITIS C ANTIBODY
Hepatitis C Ab: NONREACTIVE
SIGNAL TO CUT-OFF: 0 (ref <1.00)

## 2021-01-09 LAB — HIV ANTIBODY (ROUTINE TESTING W REFLEX): HIV 1&2 Ab, 4th Generation: NONREACTIVE

## 2021-01-11 ENCOUNTER — Encounter: Payer: Self-pay | Admitting: Podiatry

## 2021-01-11 ENCOUNTER — Ambulatory Visit (INDEPENDENT_AMBULATORY_CARE_PROVIDER_SITE_OTHER): Payer: 59 | Admitting: Podiatry

## 2021-01-11 ENCOUNTER — Other Ambulatory Visit: Payer: Self-pay

## 2021-01-11 DIAGNOSIS — M79674 Pain in right toe(s): Secondary | ICD-10-CM | POA: Diagnosis not present

## 2021-01-11 DIAGNOSIS — B351 Tinea unguium: Secondary | ICD-10-CM

## 2021-01-11 NOTE — Progress Notes (Signed)
   Subjective: 30 y.o. male presenting today for follow-up evaluation of onychomycosis to the right hallux nail plate.  Patient states that oral Lamisil daily which resolved the issue.  Patient is requesting some information and advice for prevention to prevent recurrence of the onychomycosis of the toenail.  He presents for further treatment evaluation  Past Medical History:  Diagnosis Date  . Anxiety   . Dysautonomia/POTS 09/06/2013  . Gastric ulcer with obstruction April 2013   hosp [facility] , [facility]  . GERD (gastroesophageal reflux disease)   . Gilbert syndrome   . IBS (irritable bowel syndrome)   . Tachycardia     Objective: Physical Exam General: The patient is alert and oriented x3 in no acute distress.  Dermatology: The onychomycosis and thickening with discoloration to the right hallux nail plate appears to be mostly resolved.  There is some dystrophic portions to the very distal tip of the hallux nail plate which I presume should grow out with time. Skin is warm, dry and supple bilateral lower extremities. Negative for open lesions or macerations.  Vascular: Palpable pedal pulses bilaterally. No edema or erythema noted. Capillary refill within normal limits.  Neurological: Epicritic and protective threshold grossly intact bilaterally.   Musculoskeletal Exam: Range of motion within normal limits to all pedal and ankle joints bilateral. Muscle strength 5/5 in all groups bilateral.   Assessment: #1 Onychomycosis right hallux nail plate #2 Hyperkeratotic nail right hallux nail plate  Plan of Care:  #1 Patient was evaluated. #2 the oral Lamisil seem to help tremendously with the patient's onychomycosis of his toenails.  He states that it is completely resolved. #3 recommend OTC antifungal foot spray daily for prevention #4 return to clinic as needed  Felecia Shelling, DPM [facility]  Dr. Felecia Shelling, DPM    [address]                                         [place], [place] [address]                Office [phone]  Fax [phone]

## 2021-01-14 ENCOUNTER — Ambulatory Visit: Payer: 59 | Admitting: Psychology

## 2021-01-16 ENCOUNTER — Ambulatory Visit (INDEPENDENT_AMBULATORY_CARE_PROVIDER_SITE_OTHER): Payer: 59 | Admitting: Psychology

## 2021-01-16 DIAGNOSIS — F4323 Adjustment disorder with mixed anxiety and depressed mood: Secondary | ICD-10-CM

## 2021-01-21 ENCOUNTER — Telehealth: Payer: Self-pay | Admitting: Internal Medicine

## 2021-01-21 NOTE — Addendum Note (Signed)
Addended by: Sherlene Shams on: 01/21/2021 12:58 PM   Modules accepted: Orders

## 2021-01-21 NOTE — Telephone Encounter (Signed)
See message about sleep study  The order was originally written as a home sleep test so I don't understand why I have to re write it.    Nevertheless I have gone back into feb 7 visit and included the info in the message  from [facility] because I'm not sure what else to do

## 2021-01-21 NOTE — Telephone Encounter (Signed)
Please see note below. 

## 2021-01-21 NOTE — Telephone Encounter (Signed)
[name] from [facility], [phone], Ext # [phone]. Patient is only approved for a home sleep study, with not authorizations required. [name] needs ordered refaxed with in home sleep study. Fax number is [phone].

## 2021-01-21 NOTE — Telephone Encounter (Signed)
Ok, order was faxed to [phone]. Thank you!

## 2021-02-01 ENCOUNTER — Ambulatory Visit: Payer: Self-pay | Admitting: Psychology

## 2021-02-05 ENCOUNTER — Other Ambulatory Visit: Payer: Self-pay | Admitting: Internal Medicine

## 2021-02-08 ENCOUNTER — Encounter: Payer: Self-pay | Admitting: Internal Medicine

## 2021-02-08 ENCOUNTER — Ambulatory Visit (INDEPENDENT_AMBULATORY_CARE_PROVIDER_SITE_OTHER): Payer: 59 | Admitting: Internal Medicine

## 2021-02-08 ENCOUNTER — Other Ambulatory Visit: Payer: Self-pay

## 2021-02-08 VITALS — BP 98/64 | HR 64 | Temp 98.0°F | Ht 74.0 in | Wt 168.8 lb

## 2021-02-08 DIAGNOSIS — Z113 Encounter for screening for infections with a predominantly sexual mode of transmission: Secondary | ICD-10-CM

## 2021-02-08 DIAGNOSIS — F959 Tic disorder, unspecified: Secondary | ICD-10-CM | POA: Diagnosis not present

## 2021-02-08 DIAGNOSIS — G479 Sleep disorder, unspecified: Secondary | ICD-10-CM | POA: Diagnosis not present

## 2021-02-08 DIAGNOSIS — K760 Fatty (change of) liver, not elsewhere classified: Secondary | ICD-10-CM

## 2021-02-08 NOTE — Progress Notes (Addendum)
Patient ID: Christian Montgomery, male    DOB: 09/08/1991  Age: 30 y.o. MRN: [id number]  The patient is here for annual wellness examination and management of other chronic and acute problems.   The risk factors are reflected in the social history.  The roster of all physicians providing medical care to patient - is listed in the Snapshot section of the chart.  Activities of daily living:  The patient is 100% independent in all ADLs: dressing, toileting, feeding as well as independent mobility  Home safety : The patient has smoke detectors in the home. They wear seatbelts.  There are no firearms at home. There is no violence in the home.   There is no risks for hepatitis, STDs or HIV. There is no   history of blood transfusion. They have no travel history to infectious disease endemic areas of the world.  The patient has seen their dentist in the last six month. They have seen their eye doctor in the last year.  He denies  hearing difficulty with regard to whispered voices and some television programs.  They have deferred audiologic testing in the last year.  They do not  have excessive sun exposure. Discussed the need for sun protection: hats, long sleeves and use of sunscreen if there is significant sun exposure.   Diet: the importance of a healthy diet is discussed. They do have a healthy diet.  The benefits of regular aerobic exercise were discussed. He walks 4 times per week ,  20 minutes.   Depression screen: there are no signs or vegative symptoms of depression- irritability, change in appetite, anhedonia, sadness/tearfullness.   The following portions of the patient's history were reviewed and updated as appropriate: allergies, current medications, past family history, past medical history,  past surgical history, past social history  and problem list.  Visual acuity was not assessed per patient preference since she has regular follow up with her ophthalmologist. Hearing and body mass index  were assessed and reviewed.   During the course of the visit the patient was educated and counseled about appropriate screening and preventive services including : fall prevention , diabetes screening, nutrition counseling, colorectal cancer screening, and recommended immunizations.    CC: The primary encounter diagnosis was Tic. Diagnoses of Nonalcoholic fatty liver disease without nonalcoholic steatohepatitis (NASH), Screen for STD (sexually transmitted disease), and Sleep disorder were also pertinent to this visit.  1) still waiting  On the results of home sleep test.  Done FEB 22  2) Has changed jobs and is now working for the [employer],  Lots of outside work .  Better benefits.    History Christian Montgomery has a past medical history of Anxiety, Dysautonomia/POTS (09/06/2013), Gastric ulcer with obstruction (April 2013), GERD (gastroesophageal reflux disease), Sullivan Lone syndrome, IBS (irritable bowel syndrome), and Tachycardia.   He has a past surgical history that includes Cosmetic surgery; Colonoscopy; and Nasal septoplasty w/ turbinoplasty (Bilateral, 12/25/2020).   His family history includes Alcohol abuse in his father; COPD in his father; Cancer in his maternal grandmother; Diabetes in his maternal grandmother; Diabetes (age of onset: 64) in his sister; Early death in his maternal grandmother; Heart disease in his father; Heart failure in his paternal grandmother.He reports that he has never smoked. He has never used smokeless tobacco. He reports that he does not drink alcohol and does not use drugs.  Outpatient Medications Prior to Visit  Medication Sig Dispense Refill  . azelastine (ASTELIN) 0.1 % nasal spray Place into  both nostrils 2 (two) times daily. Use in each nostril as directed    . dicyclomine (BENTYL) 10 MG capsule TAKE 1 CAPSULE BY MOUTH FOUR TIMES A DAY, BEFORE MEALS AND BEDTIME 90 capsule 3  . montelukast (SINGULAIR) 10 MG tablet TAKE 1 TABLET BY MOUTH EVERY DAY AT BEDTIME 90  tablet 3  . Multiple Vitamins-Minerals (MULTIVITAMIN ADULTS PO) Take by mouth.    . pantoprazole (PROTONIX) 40 MG tablet TAKE 1 TABLET BY MOUTH EVERY DAY 90 tablet 1  . PARoxetine (PAXIL) 10 MG tablet TAKE 1 TABLET (10 MG TOTAL) BY MOUTH DAILY WITH SUPPER. 90 tablet 1  . Probiotic Product (PROBIOTIC DAILY PO) Take by mouth.    . Simethicone (GAS-X PO) Take by mouth.     No facility-administered medications prior to visit.    Review of Systems   Patient denies headache, fevers, malaise, unintentional weight loss, skin rash, eye pain, sinus congestion and sinus pain, sore throat, dysphagia,  hemoptysis , cough, dyspnea, wheezing, chest pain, palpitations, orthopnea, edema, abdominal pain, nausea, melena, diarrhea, constipation, flank pain, dysuria, hematuria, urinary  Frequency, nocturia, numbness, tingling, seizures,  Focal weakness, Loss of consciousness,  Tremor, insomnia, depression, anxiety, and suicidal ideation.     Objective:  BP 98/64   Pulse 64   Temp 98 F (36.7 C)   Ht 6\' 2"  (1.88 m)   Wt 168 lb 12.8 oz (76.6 kg)   SpO2 99%   BMI 21.67 kg/m   Physical Exam  General appearance: alert, cooperative and appears stated age Ears: normal TM's and external ear canals both ears Throat: lips, mucosa, and tongue normal; teeth and gums normal Neck: no adenopathy, no carotid bruit, supple, symmetrical, trachea midline and thyroid not enlarged, symmetric, no tenderness/mass/nodules Back: symmetric, no curvature. ROM normal. No CVA tenderness. Lungs: clear to auscultation bilaterally Heart: regular rate and rhythm, S1, S2 normal, no murmur, click, rub or gallop Abdomen: soft, non-tender; bowel sounds normal; no masses,  no organomegaly Pulses: 2+ and symmetric Skin: Skin color, texture, turgor normal. No rashes or lesions Lymph nodes: Cervical, supraclavicular, and axillary nodes normal.  Assessment & Plan:   Problem List Items Addressed This Visit      Unprioritized    Nonalcoholic fatty liver disease without nonalcoholic steatohepatitis (NASH)    LFTs normal  Except for mild stable elevation of T Bili  No significant change during use of lamisil.  He is no longer using the medication.    Lab Results  Component Value Date   ALT 16 01/07/2021   AST 15 01/07/2021   ALKPHOS 49 01/07/2021   BILITOT 0.8 01/07/2021         Screen for STD (sexually transmitted disease)    He has passed the requested STD screening tests       Sleep disorder    Sleep study has been done;  But results have not been received.       Other Visit Diagnoses    Tic    -  Primary   Relevant Orders   Ambulatory referral to Neurology      I am having 03/07/2021 L. Wayson maintain his montelukast, pantoprazole, Multiple Vitamins-Minerals (MULTIVITAMIN ADULTS PO), Probiotic Product (PROBIOTIC DAILY PO), Simethicone (GAS-X PO), azelastine, PARoxetine, and dicyclomine.  No orders of the defined types were placed in this encounter.   There are no discontinued medications.  Follow-up: No follow-ups on file.   Dorene Sorrow, MD

## 2021-02-08 NOTE — Patient Instructions (Signed)
Tic Disorders A tic disorder is a condition in which a person makes sudden and repeated movements or sounds (tics). There are three types of tic disorders:  Transient or provisional tic disorder (common). This type usually goes away within a year or two.  Chronic or persistent tic disorder. This type may last all through childhood and continue into the adult years.  Tourette syndrome (rare). This type lasts through all of life. It often occurs with other disorders. Tic disorders start before age 15, usually between age 16 and 32. These disorders cannot be cured, but there are many treatments that can help manage tics. Most tic disorders get better over time. What are the causes? The cause of this condition is not known. What are the signs or symptoms? The main symptom of this condition is experiencing tics. There are four types of tics:  Simple motor tics. These are movements in one area of the body.  Complex motor tics. These are movements in large areas or in several areas of the body.  Simple vocal tics. These are single sounds.  Complex vocal tics. These are sounds that include several words or phrases. Tics range in severity and may be more severe when you are stressed or tired. Tics can change over time. Symptoms of simple motor tics  Blinking, squinting, or eyebrow raising.  Nose wrinkling.  Mouth twitching, grimacing, or making tongue movements.  Head nodding or twisting.  Shoulder shrugging.  Arm jerking.  Foot shaking. Symptoms of complex motor tics  Grooming behavior, such as combing one's hair.  Smelling objects.  Jumping.  Imitating others' behavior.  Making rude or obscene gestures. Symptoms of simple vocal tics  Coughing.  Humming.  Throat clearing.  Grunting.  Yawning.  Sniffing.  Barking.  Snorting. Symptoms of complex vocal tics  Imitating what others say.  Saying words and sentences that may: ? Seem out of context. ? Being  rude. How is this diagnosed? This condition is diagnosed based on:  Your symptoms.  Your medical history.  A physical exam.  An exam of your nervous system (neurological exam).  Tests. These may be done to rule out other conditions that cause symptoms like tics. Tests may include: ? Blood tests. ? Brain imaging tests. Your health care provider will ask you about:  The type of tics you have.  When the tics started and how often they happen.  How the tics affect your daily activities.  Other medical issues you may have.  Whether you take over-the-counter or prescription medicines.  Whether you use any drugs. You may be referred to a brain and nerve specialist (neurologist) or a mental health specialist for further evaluation. How is this treated? Treatment for this condition depends on how severe your tics are. If they are mild, you may not need treatment. If they are more severe, you may benefit from treatment. Some treatments include:  Cognitive behavioral therapy. This kind of therapy involves talking to a mental health professional. The therapist can help you to: ? Become more aware of your tics. ? Learn ways to control your tics. ? Know how to disguise your tics.  Family therapy. This kind of therapy provides education and emotional support for your family members.  Medicine that helps to control tics.  Medicine that is injected into the body to relax muscles (botulinum toxin). This may be a treatment option if your tics are severe.  Electrical stimulation of the brain (deep brain stimulation). This may be a treatment option  if your tics are severe.   Follow these instructions at home:  Take over-the-counter and prescription medicines only as told by your health care provider.  Check with your health care provider before using any new prescription or over-the-counter medicines.  Keep all follow-up visits as told by your health care provider. This is  important. Contact a health care provider if:  You are not able to take your medicines as prescribed.  Your symptoms get worse.  Your symptoms are interfering with your ability to function normally at home, work, or school.  You have new or unusual symptoms like pain or weakness.  Your symptoms make you feel depressed or anxious. Summary  A tic disorder is a condition in which a person makes sudden and repeated movements or sounds.  Tic disorders start before age 18, usually between the age of 5 and 10.  Many tic disorders are mild and do not need treatment.  These disorders cannot be cured, but there are many treatments that can help manage tics. This information is not intended to replace advice given to you by your health care provider. Make sure you discuss any questions you have with your health care provider. Document Revised: 08/28/2020 Document Reviewed: 08/28/2020 Elsevier Patient Education  2021 Elsevier Inc.  

## 2021-02-10 NOTE — Assessment & Plan Note (Signed)
Sleep study has been done;  But results have not been received.

## 2021-02-10 NOTE — Assessment & Plan Note (Signed)
He has passed the requested STD screening tests

## 2021-02-10 NOTE — Assessment & Plan Note (Signed)
LFTs normal  Except for mild stable elevation of T Bili  No significant change during use of lamisil.  He is no longer using the medication.    Lab Results  Component Value Date   ALT 16 01/07/2021   AST 15 01/07/2021   ALKPHOS 49 01/07/2021   BILITOT 0.8 01/07/2021

## 2021-02-24 ENCOUNTER — Telehealth: Payer: Self-pay | Admitting: Internal Medicine

## 2021-02-24 DIAGNOSIS — G4733 Obstructive sleep apnea (adult) (pediatric): Secondary | ICD-10-CM | POA: Insufficient documentation

## 2021-02-24 NOTE — Telephone Encounter (Signed)
His 2 night sleep study was  positive for OSA.  A CPAP titration study is advised and the referral has been placed

## 2021-02-24 NOTE — Assessment & Plan Note (Signed)
His 2 night sleep study was  positive for OSA.  A CPAP titration study is advised and the referral has been placed  

## 2021-02-25 NOTE — Telephone Encounter (Signed)
Good afternoon!  Order received and sent. Thank you!

## 2021-03-02 DIAGNOSIS — Z01818 Encounter for other preprocedural examination: Secondary | ICD-10-CM

## 2021-03-27 ENCOUNTER — Telehealth: Payer: Self-pay

## 2021-03-27 NOTE — Telephone Encounter (Signed)
I called [name] at [facility] & asked if she could fax [place] results from patient's study again. I also asked if she would call back to clarify a few things.

## 2021-04-14 ENCOUNTER — Other Ambulatory Visit: Payer: Self-pay | Admitting: Internal Medicine

## 2021-05-30 ENCOUNTER — Telehealth: Payer: Self-pay | Admitting: Internal Medicine

## 2021-05-30 NOTE — Telephone Encounter (Signed)
Spoke with Christian Montgomery and she stated that they actually did not need anything more and that they have sent off the paperwork to get authorization.

## 2021-05-30 NOTE — Telephone Encounter (Signed)
[facility] [name] called in regards to info needed about clinical notes. They can be reached at [phone] ext [phone] for more info.

## 2021-06-14 DIAGNOSIS — H52223 Regular astigmatism, bilateral: Secondary | ICD-10-CM | POA: Diagnosis not present

## 2021-06-20 DIAGNOSIS — G4733 Obstructive sleep apnea (adult) (pediatric): Secondary | ICD-10-CM

## 2021-06-21 NOTE — Telephone Encounter (Signed)
CPAP ORDERE PRINTED AND SIGNED,  PLEASE SEND TO [facility] AND LET PATIET KNOW

## 2021-06-24 NOTE — Telephone Encounter (Signed)
faxed

## 2021-06-29 ENCOUNTER — Other Ambulatory Visit: Payer: Self-pay | Admitting: Internal Medicine

## 2021-07-30 MED ORDER — PANTOPRAZOLE SODIUM 40 MG PO TBEC: 40.0000 mg | DELAYED_RELEASE_TABLET | Freq: Every day | ORAL | 1 refills | Status: DC

## 2021-07-31 ENCOUNTER — Telehealth: Payer: Self-pay

## 2021-07-31 NOTE — Telephone Encounter (Signed)
Pt has question about referral for CPAP and [facility].

## 2021-08-01 NOTE — Telephone Encounter (Signed)
Spoke with pt and he stated that he called yesterday to follow up on the CPAP machine with [facility] because they had told him a month ago that they were on back order. When pt called back over there yesterday they told him that he had been taken out of the system because they had not received enough information. Let pt know that I would speak with Rasheedah tomorrow when she is back in the office about the referral that she already placed.

## 2021-08-06 NOTE — Telephone Encounter (Signed)
Patient called about the status of his CPAP machine. The company says they do not have all the provider's information.

## 2021-08-07 NOTE — Telephone Encounter (Signed)
Spoke with pt to let him know that we have corrected the DME order and faxed it back to [facility].

## 2021-08-07 NOTE — Telephone Encounter (Signed)
LMTCB

## 2021-08-07 NOTE — Telephone Encounter (Signed)
Christian Montgomery with [facility] states that they need settings on rx for auto titration-insurance requires a range. It usually is between 4-20. Her call back number [phone].

## 2021-09-19 ENCOUNTER — Ambulatory Visit (INDEPENDENT_AMBULATORY_CARE_PROVIDER_SITE_OTHER): Payer: 59 | Admitting: Psychology

## 2021-09-19 DIAGNOSIS — F4323 Adjustment disorder with mixed anxiety and depressed mood: Secondary | ICD-10-CM | POA: Diagnosis not present

## 2021-09-19 DIAGNOSIS — R69 Illness, unspecified: Secondary | ICD-10-CM | POA: Diagnosis not present

## 2021-09-25 ENCOUNTER — Ambulatory Visit: Payer: Self-pay | Admitting: Internal Medicine

## 2021-09-27 ENCOUNTER — Other Ambulatory Visit: Payer: Self-pay

## 2021-09-27 ENCOUNTER — Encounter: Payer: Self-pay | Admitting: Internal Medicine

## 2021-09-27 ENCOUNTER — Ambulatory Visit (INDEPENDENT_AMBULATORY_CARE_PROVIDER_SITE_OTHER): Payer: 59 | Admitting: Internal Medicine

## 2021-09-27 VITALS — BP 126/74 | HR 70 | Temp 97.0°F | Ht 74.0 in | Wt 168.8 lb

## 2021-09-27 DIAGNOSIS — R69 Illness, unspecified: Secondary | ICD-10-CM | POA: Diagnosis not present

## 2021-09-27 DIAGNOSIS — G4733 Obstructive sleep apnea (adult) (pediatric): Secondary | ICD-10-CM | POA: Diagnosis not present

## 2021-09-27 DIAGNOSIS — Z8489 Family history of other specified conditions: Secondary | ICD-10-CM | POA: Diagnosis not present

## 2021-09-27 DIAGNOSIS — F4321 Adjustment disorder with depressed mood: Secondary | ICD-10-CM

## 2021-09-27 DIAGNOSIS — K760 Fatty (change of) liver, not elsewhere classified: Secondary | ICD-10-CM

## 2021-09-27 NOTE — Progress Notes (Signed)
Subjective:  Patient ID: Christian Montgomery, male    DOB: Mar 11, 1991  Age: 30 y.o. MRN: [id number]  CC: The primary encounter diagnosis was Nonalcoholic fatty liver disease without nonalcoholic steatohepatitis (NASH). Diagnoses of Family history of recurrent miscarriage, OSA (obstructive sleep apnea), and Grief associated with loss of fetus were also pertinent to this visit.  HPI Christian Montgomery presents for discussion of the need for genetic testing  Chief Complaint  Patient presents with   Follow-up    Pt would like to discuss the need for a referral for genetic testing.    This visit occurred during the SARS-CoV-2 public health emergency.  Safety protocols were in place, including screening questions prior to the visit, additional usage of staff PPE, and extensive cleaning of exam room while observing appropriate contact time as indicated for disinfecting solutions.   Patient and his wife are grieving her recent miscarriage.  They have  lost 3 babies since February.  One was a set of twins that she miscarried at [redacted] weeks gestation ;   the second one occurred  on Oct 15 , fetal age was 16 weeks .  States that genetic testing was done on his wife and was reportedly negative for chromosomal abnormalities .  The  twins had a chromosomal abnormality on #18.  So far on the second fetus,  no testing has been available or resulted.  He is requesting referral to a [facility] for testing  .    Patient  states that his  sister  who died last year from diabetes complications had a twin sister that was stillborn.  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       Outpatient Medications Prior to Visit  Medication Sig Dispense Refill   azelastine (ASTELIN) 0.1 % nasal  spray Place into both nostrils 2 (two) times daily. Use in each nostril as directed     dicyclomine (BENTYL) 10 MG capsule TAKE 1 CAPSULE BY MOUTH FOUR TIMES A DAY, BEFORE MEALS AND BEDTIME 90 capsule 3   montelukast (SINGULAIR) 10 MG tablet TAKE 1 TABLET BY MOUTH EVERY DAY AT BEDTIME 90 tablet 3   Multiple Vitamins-Minerals (MULTIVITAMIN ADULTS PO) Take by mouth.     pantoprazole (PROTONIX) 40 MG tablet Take 1 tablet (40 mg total) by mouth daily. 90 tablet 1   PARoxetine (PAXIL) 10 MG tablet TAKE 1 TABLET (10 MG TOTAL) BY MOUTH DAILY WITH SUPPER. 90 tablet 1   Probiotic Product (PROBIOTIC DAILY PO) Take by mouth.     Simethicone (GAS-X PO) Take by mouth.     No facility-administered medications prior to visit.    Review of Systems;  Patient denies headache, fevers, malaise, unintentional weight loss, skin rash, eye pain, sinus congestion and sinus pain, sore throat, dysphagia,  hemoptysis , cough, dyspnea, wheezing, chest pain, palpitations, orthopnea, edema, abdominal pain, nausea, melena, diarrhea, constipation, flank pain, dysuria, hematuria, urinary  Frequency, nocturia, numbness, tingling, seizures,  Focal weakness, Loss of consciousness,  Tremor, insomnia, depression, anxiety, and suicidal ideation.      Objective:  BP 126/74 (BP Location: Left Arm, Patient Position: Sitting, Cuff Size: Normal)   Pulse 70   Temp Marland Kitchen)  97 F (36.1 C) (Temporal)   Ht 6\' 2"  (1.88 m)   Wt 168 lb 12.8 oz (76.6 kg)   SpO2 99%   BMI 21.67 kg/m   BP Readings from Last 3 Encounters:  09/27/21 126/74  02/08/21 98/64  01/07/21 92/70    Wt Readings from Last 3 Encounters:  09/27/21 168 lb 12.8 oz (76.6 kg)  02/08/21 168 lb 12.8 oz (76.6 kg)  01/07/21 167 lb 12.8 oz (76.1 kg)    General appearance: alert, cooperative and appears stated age Ears: normal TM's and external ear canals both ears Throat: lips, mucosa, and tongue normal; teeth and gums normal Neck: no adenopathy, no carotid bruit,  supple, symmetrical, trachea midline and thyroid not enlarged, symmetric, no tenderness/mass/nodules Back: symmetric, no curvature. ROM normal. No CVA tenderness. Lungs: clear to auscultation bilaterally Heart: regular rate and rhythm, S1, S2 normal, no murmur, click, rub or gallop Abdomen: soft, non-tender; bowel sounds normal; no masses,  no organomegaly Pulses: 2+ and symmetric Skin: Skin color, texture, turgor normal. No rashes or lesions Lymph nodes: Cervical, supraclavicular, and axillary nodes normal.  Lab Results  Component Value Date   HGBA1C 5.4 05/27/2013   HGBA1C 5.4 09/21/2012    Lab Results  Component Value Date   CREATININE 0.96 01/07/2021   CREATININE 1.13 01/23/2020   CREATININE 0.97 12/13/2019    Lab Results  Component Value Date   WBC 5.0 01/07/2021   HGB 14.7 01/07/2021   HCT 43.4 01/07/2021   PLT 226.0 01/07/2021   GLUCOSE 81 01/07/2021   CHOL 134 01/07/2021   TRIG 69.0 01/07/2021   HDL 44.40 01/07/2021   LDLCALC 76 01/07/2021   ALT 16 01/07/2021   AST 15 01/07/2021   NA 138 01/07/2021   K 4.2 01/07/2021   CL 101 01/07/2021   CREATININE 0.96 01/07/2021   BUN 11 01/07/2021   CO2 33 (H) 01/07/2021   TSH 2.69 01/07/2021   INR 1.3 (H) 02/24/2014   HGBA1C 5.4 05/27/2013    No results found.  Assessment & Plan:   Problem List Items Addressed This Visit     Family history of recurrent miscarriage    His wife has had a second miscarriage recently at [redacted] weeks gestational age.   The first was a set of twins in February at [redacted] weeks gestational age.  Genetics referral requested       Relevant Orders   Ambulatory referral to Genetics   Grief associated with loss of fetus    Patient is dealing with the unexpected loss of a third fetus and has adequate coping skills and emotional support .      Nonalcoholic fatty liver disease without nonalcoholic steatohepatitis (NASH) - Primary   Relevant Orders   Lipid panel   OSA (obstructive sleep apnea)     He has finally received his autotirating  CPAP machine (today) . Advised to follow up with me in a few weeks        I am having March L. Wigle maintain his montelukast, Multiple Vitamins-Minerals (MULTIVITAMIN ADULTS PO), Probiotic Product (PROBIOTIC DAILY PO), Simethicone (GAS-X PO), azelastine, dicyclomine, PARoxetine, and pantoprazole.  No orders of the defined types were placed in this encounter.   There are no discontinued medications.  Follow-up: No follow-ups on file.   Dorene Sorrow, MD

## 2021-09-27 NOTE — Patient Instructions (Signed)
I am so sorry for your losses.  I have made a referral to genetics counselling as requested   If you have trouble adjusting to the CPAP, let me know and I will prescribe something to help you relax

## 2021-09-29 DIAGNOSIS — Z8489 Family history of other specified conditions: Secondary | ICD-10-CM | POA: Insufficient documentation

## 2021-09-29 NOTE — Assessment & Plan Note (Signed)
Patient is dealing with the unexpected loss of a third fetus and has adequate coping skills and emotional support .

## 2021-09-29 NOTE — Assessment & Plan Note (Addendum)
His wife has had a second miscarriage recently at [redacted] weeks gestational age.   The first was a set of twins in February at [redacted] weeks gestational age.  Genetics referral requested

## 2021-09-29 NOTE — Assessment & Plan Note (Signed)
He has finally received his autotirating  CPAP machine (today) . Advised to follow up with me in a few weeks

## 2021-09-30 ENCOUNTER — Telehealth: Payer: Self-pay | Admitting: Internal Medicine

## 2021-09-30 NOTE — Telephone Encounter (Signed)
Error

## 2021-10-09 ENCOUNTER — Ambulatory Visit
Admission: EM | Admit: 2021-10-09 | Discharge: 2021-10-09 | Disposition: A | Discharge status: Home / Self Care | Payer: 59 | Attending: Emergency Medicine | Admitting: Emergency Medicine

## 2021-10-09 ENCOUNTER — Other Ambulatory Visit: Payer: Self-pay

## 2021-10-09 ENCOUNTER — Encounter: Payer: Self-pay | Admitting: Emergency Medicine

## 2021-10-09 DIAGNOSIS — J069 Acute upper respiratory infection, unspecified: Secondary | ICD-10-CM | POA: Insufficient documentation

## 2021-10-09 LAB — RAPID INFLUENZA A&B ANTIGENS
Influenza A (ARMC): NEGATIVE
Influenza B (ARMC): NEGATIVE

## 2021-10-09 MED ORDER — BENZONATATE 100 MG PO CAPS: 200.0000 mg | ORAL_CAPSULE | Freq: Three times a day (TID) | ORAL | 0 refills | Status: DC

## 2021-10-09 MED ORDER — PROMETHAZINE-DM 6.25-15 MG/5ML PO SYRP: 5.0000 mL | ORAL_SOLUTION | Freq: Four times a day (QID) | ORAL | 0 refills | Status: DC | PRN

## 2021-10-09 MED ORDER — IPRATROPIUM BROMIDE 0.06 % NA SOLN
2.0000 | Freq: Four times a day (QID) | NASAL | 12 refills | Status: DC
Start: 2021-10-09 — End: 2022-01-08

## 2021-10-09 NOTE — Discharge Instructions (Addendum)

## 2021-10-09 NOTE — ED Provider Notes (Signed)
[facility]    CSN: [id number] Arrival date & time: 10/09/21  1731      History   Chief Complaint Chief Complaint  Patient presents with   Cough   Fever    HPI Christian Montgomery is a 30 y.o. male.   HPI  30 year old male here for evaluation of respiratory complaints.  Patient reports that he developed a fever today with a T-max of 105 that was taken at home just prior to arrival.  When patient arrived here his temperature is 99.8.  He has not taken any medication for his fever.  He is also had associated symptoms of sore throat, headache, nonproductive cough, and body aches.  He also endorses mild nausea but no vomiting or diarrhea.  He denies runny nose or nasal congestion, ear pain, shortness of breath or wheezing.  No known sick contacts.  Past Medical History:  Diagnosis Date   Anxiety    Dysautonomia/POTS 09/06/2013   Gastric ulcer with obstruction April 2013   hosp [facility]   GERD (gastroesophageal reflux disease)    Rosanna Randy syndrome    IBS (irritable bowel syndrome)    Tachycardia     Patient Active Problem List   Diagnosis Date Noted   Family history of recurrent miscarriage 09/29/2021   OSA (obstructive sleep apnea) 02/24/2021   Sleep disorder 12/09/2020   Acromioclavicular joint pain 02/03/2020   Tachycardia 01/04/2020   Grief associated with loss of fetus 10/21/2019   Onychomycosis of toenail 07/27/2019   Flatulence 06/16/2019   Cervical radiculopathy 11/18/2018   Anxiety as acute reaction to exceptional stress 09/16/2018   Screen for STD (sexually transmitted disease) 09/16/2018   IBS (irritable bowel syndrome) 12/30/2015   Dysautonomia/POTS 09/06/2013   Allergic rhinitis 03/25/2013   Constipation - functional 123456   Nonalcoholic fatty liver disease without nonalcoholic steatohepatitis (NASH) 09/22/2012   Personal history of gastric ulcer 03/01/2012    Past Surgical History:  Procedure Laterality Date   COLONOSCOPY      COSMETIC SURGERY     NASAL SEPTOPLASTY W/ TURBINOPLASTY Bilateral 12/25/2020   Procedure: NASAL SEPTOPLASTY WITH TURBINATE REDUCTION;  Surgeon: Clyde Canterbury, MD;  Location: [facility];  Service: ENT;  Laterality: Bilateral;       Home Medications    Prior to Admission medications   Medication Sig Start Date End Date Taking? Authorizing Provider  benzonatate (TESSALON) 100 MG capsule Take 2 capsules (200 mg total) by mouth every 8 (eight) hours. 10/09/21  Yes Margarette Canada, NP  ipratropium (ATROVENT) 0.06 % nasal spray Place 2 sprays into both nostrils 4 (four) times daily. 10/09/21  Yes Margarette Canada, NP  promethazine-dextromethorphan (PROMETHAZINE-DM) 6.25-15 MG/5ML syrup Take 5 mLs by mouth 4 (four) times daily as needed. 10/09/21  Yes Margarette Canada, NP  azelastine (ASTELIN) 0.1 % nasal spray Place into both nostrils 2 (two) times daily. Use in each nostril as directed    [provider]  dicyclomine (BENTYL) 10 MG capsule TAKE 1 CAPSULE BY MOUTH FOUR TIMES A DAY, BEFORE MEALS AND BEDTIME 02/05/21   Crecencio Mc, MD  montelukast (SINGULAIR) 10 MG tablet TAKE 1 TABLET BY MOUTH EVERY DAY AT BEDTIME 08/16/20   Crecencio Mc, MD  Multiple Vitamins-Minerals (MULTIVITAMIN ADULTS PO) Take by mouth.    [provider]  pantoprazole (PROTONIX) 40 MG tablet Take 1 tablet (40 mg total) by mouth daily. 07/30/21   Crecencio Mc, MD  PARoxetine (PAXIL) 10 MG tablet TAKE 1 TABLET (  10 MG TOTAL) BY MOUTH DAILY WITH SUPPER. 07/01/21   Sherlene Shams, MD  Probiotic Product (PROBIOTIC DAILY PO) Take by mouth.    [provider]  Simethicone (GAS-X PO) Take by mouth.    [provider]    Family History Family History  Problem Relation Age of Onset   COPD Father    Alcohol abuse Father    Heart disease Father    Diabetes Sister 54       gestational, not overweight    Diabetes Maternal Grandmother    Cancer Maternal Grandmother        endometrial   Early death  Maternal Grandmother        cause unknown   Heart failure Paternal Grandmother     Social History Social History   Tobacco Use   Smoking status: Never   Smokeless tobacco: Never  Vaping Use   Vaping Use: Never used  Substance Use Topics   Alcohol use: No   Drug use: No     Allergies   Milk-related compounds   Review of Systems Review of Systems  Constitutional:  Positive for fever. Negative for activity change and appetite change.  HENT:  Positive for sore throat. Negative for congestion, ear pain and rhinorrhea.   Respiratory:  Positive for cough. Negative for shortness of breath and wheezing.   Gastrointestinal:  Positive for nausea. Negative for diarrhea and vomiting.  Musculoskeletal:  Positive for arthralgias and myalgias.  Skin:  Negative for rash.  Neurological:  Positive for headaches.  Hematological: Negative.   Psychiatric/Behavioral: Negative.      Physical Exam Triage Vital Signs ED Triage Vitals  Enc Vitals Group     BP 10/09/21 1818 108/86     Pulse Rate 10/09/21 1818 (!) 123     Resp 10/09/21 1818 18     Temp 10/09/21 1818 99.8 F (37.7 C)     Temp Source 10/09/21 1818 Oral     SpO2 10/09/21 1818 99 %     Weight --      Height --      Head Circumference --      Peak Flow --      Pain Score 10/09/21 1819 7     Pain Loc --      Pain Edu? --      Excl. in GC? --    No data found.  Updated Vital Signs BP 108/86 (BP Location: Right Arm)   Pulse (!) 123   Temp 99.8 F (37.7 C) (Oral)   Resp 18   SpO2 99%   Visual Acuity Right Eye Distance:   Left Eye Distance:   Bilateral Distance:    Right Eye Near:   Left Eye Near:    Bilateral Near:     Physical Exam Vitals and nursing note reviewed.  Constitutional:      General: He is not in acute distress.    Appearance: Normal appearance. He is not ill-appearing.  HENT:     Head: Normocephalic and atraumatic.     Right Ear: Tympanic membrane, ear canal and external ear normal. There is  no impacted cerumen.     Left Ear: Tympanic membrane, ear canal and external ear normal. There is no impacted cerumen.     Nose: Congestion and rhinorrhea present.     Mouth/Throat:     Mouth: Mucous membranes are moist.     Pharynx: Oropharynx is clear. Posterior oropharyngeal erythema present.  Cardiovascular:  Rate and Rhythm: Normal rate and regular rhythm.     Pulses: Normal pulses.     Heart sounds: Normal heart sounds. No murmur heard.   No gallop.  Pulmonary:     Effort: Pulmonary effort is normal.     Breath sounds: Normal breath sounds. No wheezing, rhonchi or rales.  Musculoskeletal:     Cervical back: Normal range of motion and neck supple.  Lymphadenopathy:     Cervical: No cervical adenopathy.  Skin:    General: Skin is warm and dry.     Capillary Refill: Capillary refill takes less than 2 seconds.     Findings: No erythema or rash.  Neurological:     General: No focal deficit present.     Mental Status: He is alert and oriented to person, place, and time.  Psychiatric:        Mood and Affect: Mood normal.        Behavior: Behavior normal.        Thought Content: Thought content normal.        Judgment: Judgment normal.     UC Treatments / Results  Labs (all labs ordered are listed, but only abnormal results are displayed) Labs Reviewed  RAPID INFLUENZA A&B ANTIGENS    EKG   Radiology No results found.  Procedures Procedures (including critical care time)  Medications Ordered in UC Medications - No data to display  Initial Impression / Assessment and Plan / UC Course  I have reviewed the triage vital signs and the nursing notes.  Pertinent labs & imaging results that were available during my care of the patient were reviewed by me and considered in my medical decision making (see chart for details).  Patient is a nontoxic-appearing 30 year old male here for evaluation of respiratory symptoms as outlined in HPI above.  Patient's physical exam  reveals pearly gray tympanic membranes bilaterally with normal light reflex and clear external auditory canals.  Nasal mucosa is mildly erythematous and edematous with scant clear nasal discharge in both nares.  Oropharyngeal exam reveals mild posterior oropharyngeal erythema with clear postnasal drip.  No cervical lymphadenopathy appreciated on exam.  Cardiopulmonary exam reveals clear lung sounds in all fields.  Patient's physical exam is consistent with a viral URI with a cough.  Will swab patient for influenza.  Rapid influenza is negative.  Will discharge patient home with a diagnosis of viral URI with a cough and will give Atrovent nasal spray to help with the nasal congestion, Tessalon Perles and Promethazine DM cough syrup to help with cough and congestion.  The cough syrup should also help with nausea.  Work note provided.   Final Clinical Impressions(s) / UC Diagnoses   Final diagnoses:  Viral URI with cough     Discharge Instructions      Use the Atrovent nasal spray, 2 squirts in each nostril every 6 hours, as needed for runny nose and postnasal drip.  Use the Tessalon Perles every 8 hours during the day.  Take them with a small sip of water.  They may give you some numbness to the base of your tongue or a metallic taste in your mouth, this is normal.  Use the Promethazine DM cough syrup at bedtime for cough and congestion.  It will make you drowsy so do not take it during the day.  Return for reevaluation or see your primary care provider for any new or worsening symptoms.      ED Prescriptions  Medication Sig Dispense Auth. Provider   benzonatate (TESSALON) 100 MG capsule Take 2 capsules (200 mg total) by mouth every 8 (eight) hours. 21 capsule Margarette Canada, NP   promethazine-dextromethorphan (PROMETHAZINE-DM) 6.25-15 MG/5ML syrup Take 5 mLs by mouth 4 (four) times daily as needed. 118 mL Margarette Canada, NP   ipratropium (ATROVENT) 0.06 % nasal spray Place 2 sprays  into both nostrils 4 (four) times daily. 15 mL Margarette Canada, NP      PDMP not reviewed this encounter.   Margarette Canada, NP 10/09/21 [phone]

## 2021-10-09 NOTE — ED Triage Notes (Signed)
Pt presents today with c/o fever and cough that began today. +nausea.

## 2021-10-10 ENCOUNTER — Other Ambulatory Visit: Payer: Self-pay | Admitting: Internal Medicine

## 2021-10-10 DIAGNOSIS — G4733 Obstructive sleep apnea (adult) (pediatric): Secondary | ICD-10-CM

## 2021-10-10 MED ORDER — ALPRAZOLAM 0.25 MG PO TABS: 0.2500 mg | ORAL_TABLET | Freq: Every evening | ORAL | 1 refills | Status: DC | PRN

## 2021-10-10 NOTE — Assessment & Plan Note (Signed)
Prescribing alprazolam to manage CPAP intolerance

## 2021-10-14 ENCOUNTER — Telehealth: Payer: Self-pay

## 2021-10-14 NOTE — Telephone Encounter (Signed)
Noted  

## 2021-10-14 NOTE — Telephone Encounter (Signed)
Pt returning call, Advise pt of note (Please tell Nael that I need more information from his wife's OB   about the chromosomal abnormalities found on their baby, because the genetics counsellor here  at [facility] cannot take the referral so I will need to send him to [facility] or [facility].)

## 2021-10-14 NOTE — Telephone Encounter (Signed)
LMTCB    Please tell Christian Montgomery that I need more information from his wife's OB   about the chromosomal abnormalities found on their baby, because the genetics counsellor here  at [facility] cannot take the referral so I will need to send him to [facility] or [facility].

## 2021-10-15 ENCOUNTER — Other Ambulatory Visit: Payer: Self-pay | Admitting: Internal Medicine

## 2021-10-15 DIAGNOSIS — Z8489 Family history of other specified conditions: Secondary | ICD-10-CM

## 2021-10-17 ENCOUNTER — Ambulatory Visit: Payer: Self-pay | Admitting: Internal Medicine

## 2021-10-18 NOTE — Telephone Encounter (Signed)
Patient called and wanted to speak to Jessica. ?

## 2021-10-22 ENCOUNTER — Ambulatory Visit: Payer: Self-pay | Admitting: Psychology

## 2021-10-27 ENCOUNTER — Encounter: Payer: Self-pay | Admitting: Internal Medicine

## 2021-10-28 DIAGNOSIS — G4733 Obstructive sleep apnea (adult) (pediatric): Secondary | ICD-10-CM | POA: Diagnosis not present

## 2021-11-07 ENCOUNTER — Encounter: Payer: Self-pay | Admitting: Internal Medicine

## 2021-11-12 NOTE — Telephone Encounter (Signed)
I have pended the referral unable to find the exact location he is stating in the [place] message.

## 2021-11-27 DIAGNOSIS — G4733 Obstructive sleep apnea (adult) (pediatric): Secondary | ICD-10-CM | POA: Diagnosis not present

## 2021-12-02 ENCOUNTER — Encounter: Payer: Self-pay | Admitting: Internal Medicine

## 2021-12-05 DIAGNOSIS — Z8489 Family history of other specified conditions: Secondary | ICD-10-CM | POA: Diagnosis not present

## 2021-12-05 DIAGNOSIS — Z3169 Encounter for other general counseling and advice on procreation: Secondary | ICD-10-CM | POA: Diagnosis not present

## 2021-12-06 ENCOUNTER — Other Ambulatory Visit: Payer: Self-pay

## 2021-12-06 ENCOUNTER — Ambulatory Visit (INDEPENDENT_AMBULATORY_CARE_PROVIDER_SITE_OTHER): Payer: 59 | Admitting: Internal Medicine

## 2021-12-06 ENCOUNTER — Encounter: Payer: Self-pay | Admitting: Internal Medicine

## 2021-12-06 DIAGNOSIS — K582 Mixed irritable bowel syndrome: Secondary | ICD-10-CM | POA: Diagnosis not present

## 2021-12-06 DIAGNOSIS — Z8489 Family history of other specified conditions: Secondary | ICD-10-CM

## 2021-12-06 NOTE — Patient Instructions (Addendum)
I think your IBS is acting  up,  so I propose three things:   1) a trial of IBGuard  90 mg (you can buy this  online ). This is a peppermint extract that has been very helpful for some people  2) daily use of a probiotic or daily serving of yogurt  (you can use lactase supplement before eating it if the dairy bothers you)  2) following a low Fod[place] diet (to modify and omit the foods known to bother you)  ) referral to Dr Mia Creek at [facility] (where Dr Mechele Collin was )

## 2021-12-06 NOTE — Progress Notes (Signed)
Subjective:  Patient ID: Christian Montgomery, male    DOB: 12-18-1990  Age: 31 y.o. MRN: [id number]  CC: Diagnoses of Family history of recurrent miscarriage and Irritable bowel syndrome with both constipation and diarrhea were pertinent to this visit.   This visit occurred during the SARS-CoV-2 public health emergency.  Safety protocols were in place, including screening questions prior to the visit, additional usage of staff PPE, and extensive cleaning of exam room while observing appropriate contact time as indicated for disinfecting solutions.    HPI Christian Montgomery presents for  Chief Complaint  Patient presents with   Acute Visit    Stomach cramps that come and go, changes in bowel movements sometimes the stool is hard and sometimes it is watery, has a bowel movement every other day.    This visit occurred during the SARS-CoV-2 public health emergency.  Safety protocols were in place, including screening questions prior to the visit, additional usage of staff PPE, and extensive cleaning of exam room while observing appropriate contact time as indicated for disinfecting solutions.   One month history of intermittent bowel changes  alternating with loose stools.   Has a history of IBS,  normal colonoscopy in 2013 (Elliott,no tics) ,  with chronic food intolerances to lettuce (not salad dressing). , popcorn ,nuts  all cause cramps but not diarrhea. Using dicyclomine tid, which used to  control his symptoms,  but not recently  Last Friday ate tacos at a [employer] and had 5 days of crampy bilateral abdominal pain and recurrent loose stools without blood, fevers or nausea.  Family ate the same food without symptoms of colitis.   Outpatient Medications Prior to Visit  Medication Sig Dispense Refill   ALPRAZolam (XANAX) 0.25 MG tablet Take 1 tablet (0.25 mg total) by mouth at bedtime as needed for anxiety. 30 tablet 1   azelastine (ASTELIN) 0.1 % nasal spray Place into both nostrils 2 (two)  times daily. Use in each nostril as directed     benzonatate (TESSALON) 100 MG capsule Take 2 capsules (200 mg total) by mouth every 8 (eight) hours. 21 capsule 0   dicyclomine (BENTYL) 10 MG capsule TAKE 1 CAPSULE BY MOUTH FOUR TIMES A DAY, BEFORE MEALS AND BEDTIME 90 capsule 3   ipratropium (ATROVENT) 0.06 % nasal spray Place 2 sprays into both nostrils 4 (four) times daily. 15 mL 12   montelukast (SINGULAIR) 10 MG tablet TAKE 1 TABLET BY MOUTH EVERY DAY AT BEDTIME 90 tablet 3   Multiple Vitamins-Minerals (MULTIVITAMIN ADULTS PO) Take by mouth.     pantoprazole (PROTONIX) 40 MG tablet Take 1 tablet (40 mg total) by mouth daily. 90 tablet 1   PARoxetine (PAXIL) 10 MG tablet TAKE 1 TABLET (10 MG TOTAL) BY MOUTH DAILY WITH SUPPER. 90 tablet 1   Probiotic Product (PROBIOTIC DAILY PO) Take by mouth.     promethazine-dextromethorphan (PROMETHAZINE-DM) 6.25-15 MG/5ML syrup Take 5 mLs by mouth 4 (four) times daily as needed. 118 mL 0   Simethicone (GAS-X PO) Take by mouth.     No facility-administered medications prior to visit.    Review of Systems;  Patient denies headache, fevers, malaise, unintentional weight loss, skin rash, eye pain, sinus congestion and sinus pain, sore throat, dysphagia,  hemoptysis , cough, dyspnea, wheezing, chest pain, palpitations, orthopnea, edema, abdominal pain, nausea, melena, diarrhea, constipation, flank pain, dysuria, hematuria, urinary  Frequency, nocturia, numbness, tingling, seizures,  Focal weakness, Loss of consciousness,  Tremor, insomnia, depression, anxiety, and  suicidal ideation.      Objective:  BP 120/86 (BP Location: Left Arm, Patient Position: Sitting, Cuff Size: Normal)    Pulse 85    Temp 97.9 F (36.6 C) (Oral)    Ht 6\' 2"  (1.88 m)    Wt 169 lb 9.6 oz (76.9 kg)    SpO2 98%    BMI 21.78 kg/m   BP Readings from Last 3 Encounters:  12/06/21 120/86  10/09/21 108/86  09/27/21 126/74    Wt Readings from Last 3 Encounters:  12/06/21 169 lb 9.6  oz (76.9 kg)  09/27/21 168 lb 12.8 oz (76.6 kg)  02/08/21 168 lb 12.8 oz (76.6 kg)    General appearance: alert, cooperative and appears stated age Ears: normal TM's and external ear canals both ears Throat: lips, mucosa, and tongue normal; teeth and gums normal Neck: no adenopathy, no carotid bruit, supple, symmetrical, trachea midline and thyroid not enlarged, symmetric, no tenderness/mass/nodules Back: symmetric, no curvature. ROM normal. No CVA tenderness. Lungs: clear to auscultation bilaterally Heart: regular rate and rhythm, S1, S2 normal, no murmur, click, rub or gallop Abdomen: soft, non-tender; bowel sounds normal; no masses,  no organomegaly Pulses: 2+ and symmetric Skin: Skin color, texture, turgor normal. No rashes or lesions Lymph nodes: Cervical, supraclavicular, and axillary nodes normal.  Lab Results  Component Value Date   HGBA1C 5.4 05/27/2013   HGBA1C 5.4 09/21/2012    Lab Results  Component Value Date   CREATININE 0.96 01/07/2021   CREATININE 1.13 01/23/2020   CREATININE 0.97 12/13/2019    Lab Results  Component Value Date   WBC 5.0 01/07/2021   HGB 14.7 01/07/2021   HCT 43.4 01/07/2021   PLT 226.0 01/07/2021   GLUCOSE 81 01/07/2021   CHOL 134 01/07/2021   TRIG 69.0 01/07/2021   HDL 44.40 01/07/2021   LDLCALC 76 01/07/2021   ALT 16 01/07/2021   AST 15 01/07/2021   NA 138 01/07/2021   K 4.2 01/07/2021   CL 101 01/07/2021   CREATININE 0.96 01/07/2021   BUN 11 01/07/2021   CO2 33 (H) 01/07/2021   TSH 2.69 01/07/2021   INR 1.3 (H) 02/24/2014   HGBA1C 5.4 05/27/2013    No results found.  Assessment & Plan:   Problem List Items Addressed This Visit     IBS (irritable bowel syndrome)    Using dicyclomine without any significant improvement.  Stress of 2nd miscarriage may be contributing.  Recommending trial of IBGard ,  Low Fodmap diet and referral to [facility]      Relevant Orders   Ambulatory referral to Gastroenterology    Family history of recurrent miscarriage    He has had an initial prenatal consultation at [facility] on Jan 5 and was advised that genetic testing was not indicated until a couple has 3 miscarriages (he and Candace have had 2) .        I am having Christian Montgomery maintain his montelukast, Multiple Vitamins-Minerals (MULTIVITAMIN ADULTS PO), Probiotic Product (PROBIOTIC DAILY PO), Simethicone (GAS-X PO), azelastine, dicyclomine, PARoxetine, pantoprazole, benzonatate, promethazine-dextromethorphan, ipratropium, and ALPRAZolam.  No orders of the defined types were placed in this encounter.    I provided  30 minutes of  face-to-face time during this encounter reviewing patient's current problems and past surgeries, labs and imaging studies, providing counseling on the above mentioned problems , and coordination  of care .   Follow-up: No follow-ups on file.   Crecencio Mc, MD

## 2021-12-06 NOTE — Assessment & Plan Note (Signed)
Using dicyclomine without any significant improvement.  Stress of 2nd miscarriage may be contributing.  Recommending trial of IBGard ,  Low Fodmap diet and referral to [facility]

## 2021-12-06 NOTE — Assessment & Plan Note (Signed)
He has had an initial prenatal consultation at [facility] on Jan 5 and was advised that genetic testing was not indicated until a couple has 3 miscarriages (he and Candace have had 2) .

## 2021-12-26 DIAGNOSIS — G4733 Obstructive sleep apnea (adult) (pediatric): Secondary | ICD-10-CM | POA: Diagnosis not present

## 2021-12-27 DIAGNOSIS — Z1371 Encounter for nonprocreative screening for genetic disease carrier status: Secondary | ICD-10-CM | POA: Diagnosis not present

## 2021-12-28 DIAGNOSIS — G4733 Obstructive sleep apnea (adult) (pediatric): Secondary | ICD-10-CM | POA: Diagnosis not present

## 2022-01-06 ENCOUNTER — Other Ambulatory Visit: Payer: Self-pay

## 2022-01-06 ENCOUNTER — Other Ambulatory Visit (INDEPENDENT_AMBULATORY_CARE_PROVIDER_SITE_OTHER): Payer: 59

## 2022-01-06 DIAGNOSIS — K760 Fatty (change of) liver, not elsewhere classified: Secondary | ICD-10-CM

## 2022-01-06 DIAGNOSIS — Z01818 Encounter for other preprocedural examination: Secondary | ICD-10-CM | POA: Diagnosis not present

## 2022-01-06 LAB — CBC WITH DIFFERENTIAL/PLATELET
Basophils Absolute: 0 10*3/uL (ref 0.0–0.1)
Basophils Relative: 0.6 % (ref 0.0–3.0)
Eosinophils Absolute: 0.1 10*3/uL (ref 0.0–0.7)
Eosinophils Relative: 1.8 % (ref 0.0–5.0)
HCT: 45.8 % (ref 39.0–52.0)
Hemoglobin: 15.3 g/dL (ref 13.0–17.0)
Lymphocytes Relative: 27.3 % (ref 12.0–46.0)
Lymphs Abs: 1 10*3/uL (ref 0.7–4.0)
MCHC: 33.3 g/dL (ref 30.0–36.0)
MCV: 89.9 fl (ref 78.0–100.0)
Monocytes Absolute: 0.3 10*3/uL (ref 0.1–1.0)
Monocytes Relative: 7.6 % (ref 3.0–12.0)
Neutro Abs: 2.3 10*3/uL (ref 1.4–7.7)
Neutrophils Relative %: 62.7 % (ref 43.0–77.0)
Platelets: 163 10*3/uL (ref 150.0–400.0)
RBC: 5.1 Mil/uL (ref 4.22–5.81)
RDW: 13.2 % (ref 11.5–15.5)
WBC: 3.7 10*3/uL — ABNORMAL LOW (ref 4.0–10.5)

## 2022-01-06 LAB — HEMOGLOBIN A1C: Hgb A1c MFr Bld: 5.5 % (ref 4.6–6.5)

## 2022-01-06 LAB — LIPID PANEL
Cholesterol: 139 mg/dL (ref 0–200)
HDL: 43.3 mg/dL (ref >39.00)
LDL Cholesterol: 84 mg/dL (ref 0–99)
NonHDL: 95.41
Total CHOL/HDL Ratio: 3
Triglycerides: 55 mg/dL (ref 0.0–149.0)
VLDL: 11 mg/dL (ref 0.0–40.0)

## 2022-01-06 LAB — BASIC METABOLIC PANEL
BUN: 11 mg/dL (ref 6–23)
CO2: 31 mEq/L (ref 19–32)
Calcium: 9.3 mg/dL (ref 8.4–10.5)
Chloride: 101 mEq/L (ref 96–112)
Creatinine, Ser: 0.9 mg/dL (ref 0.40–1.50)
GFR: 112.56 mL/min (ref >60.00)
Glucose, Bld: 97 mg/dL (ref 70–99)
Potassium: 4.4 mEq/L (ref 3.5–5.1)
Sodium: 140 mEq/L (ref 135–145)

## 2022-01-08 ENCOUNTER — Encounter: Payer: Self-pay | Admitting: Internal Medicine

## 2022-01-08 ENCOUNTER — Ambulatory Visit (INDEPENDENT_AMBULATORY_CARE_PROVIDER_SITE_OTHER): Payer: 59 | Admitting: Internal Medicine

## 2022-01-08 ENCOUNTER — Other Ambulatory Visit: Payer: Self-pay

## 2022-01-08 VITALS — BP 104/78 | HR 66 | Temp 97.8°F | Ht 74.0 in | Wt 172.8 lb

## 2022-01-08 DIAGNOSIS — K760 Fatty (change of) liver, not elsewhere classified: Secondary | ICD-10-CM

## 2022-01-08 DIAGNOSIS — H52203 Unspecified astigmatism, bilateral: Secondary | ICD-10-CM | POA: Insufficient documentation

## 2022-01-08 DIAGNOSIS — G4733 Obstructive sleep apnea (adult) (pediatric): Secondary | ICD-10-CM | POA: Diagnosis not present

## 2022-01-08 DIAGNOSIS — Z0000 Encounter for general adult medical examination without abnormal findings: Secondary | ICD-10-CM | POA: Diagnosis not present

## 2022-01-08 DIAGNOSIS — K582 Mixed irritable bowel syndrome: Secondary | ICD-10-CM | POA: Diagnosis not present

## 2022-01-08 MED ORDER — DICYCLOMINE HCL 20 MG PO TABS: 20.0000 mg | ORAL_TABLET | Freq: Two times a day (BID) | ORAL | 2 refills | Status: DC

## 2022-01-08 MED ORDER — PAROXETINE HCL 20 MG PO TABS: 20.0000 mg | ORAL_TABLET | Freq: Every day | ORAL | 0 refills | Status: DC

## 2022-01-08 MED ORDER — PANTOPRAZOLE SODIUM 40 MG PO TBEC: 40.0000 mg | DELAYED_RELEASE_TABLET | Freq: Every day | ORAL | 2 refills | Status: DC

## 2022-01-08 NOTE — Assessment & Plan Note (Signed)
LFTs normal  Except for mild stable elevation of T Bili  No significant change during use of lamisil.  He is no longer using the medication.    Lab Results  Component Value Date   ALT 16 01/07/2021   AST 15 01/07/2021   ALKPHOS 49 01/07/2021   BILITOT 0.8 01/07/2021    

## 2022-01-08 NOTE — Assessment & Plan Note (Signed)

## 2022-01-08 NOTE — Assessment & Plan Note (Addendum)
Diagnosed by prior sleep study. Patient is using CPAP every night a minimum of 6 hours per night and notes improved daytime wakefulness and decreased fatigue  

## 2022-01-08 NOTE — Progress Notes (Signed)
The patient is here for annual preventive examination and management of other chronic and acute problems.  This visit occurred during the SARS-CoV-2 public health emergency.  Safety protocols were in place, including screening questions prior to the visit, additional usage of staff PPE, and extensive cleaning of exam room while observing appropriate contact time as indicated for disinfecting solutions.     The risk factors are reflected in the social history.   The roster of all physicians providing medical care to patient - is listed in the Snapshot section of the chart.   Activities of daily living:  The patient is 100% independent in all ADLs: dressing, toileting, feeding as well as independent mobility   Home safety : The patient has smoke detectors in the home. They wear seatbelts.  There are no unsecured firearms at home. There is no violence in the home.    There is no risks for hepatitis, STDs or HIV. There is no   history of blood transfusion. They have no travel history to infectious disease endemic areas of the world.   The patient has seen their dentist in the last six month. They have seen their eye doctor in the last year. The patinet  denies slight hearing difficulty with regard to whispered voices and some television programs.  They have deferred audiologic testing in the last year.  They do not  have excessive sun exposure. Discussed the need for sun protection: hats, long sleeves and use of sunscreen if there is significant sun exposure.    Diet: the importance of a healthy diet is discussed. They do have a healthy diet.   The benefits of regular aerobic exercise were discussed. The patient  exercises  3 to 5 days per week  for  60 minutes.    Depression screen: there are no signs or vegative symptoms of depression- irritability, change in appetite, anhedonia, sadness/tearfullness.   The following portions of the patient's history were reviewed and updated as appropriate:  allergies, current medications, past family history, past medical history,  past surgical history, past social history  and problem list.   Visual acuity was not assessed per patient preference since the patient has regular follow up with an  ophthalmologist. Hearing and body mass index were assessed and reviewed.    During the course of the visit the patient was educated and counseled about appropriate screening and preventive services including : fall prevention , diabetes screening, nutrition counseling, colorectal cancer screening, and recommended immunizations.    Chief Complaint:  HPI     Annual Exam    Additional comments: Physical       Last edited by Sandy Salaam, CMA on 01/08/2022  9:00 AM.      1) he and wife had their third miscarriage at < 9 weeks.  Genetic testing has been done bu [facility] and results are pending   2)Depression : symptoms have been aggravated by recurrent miscarriages he and his wife Candace have experienced  3) IBS: aggravated by grief of 3rd miscarriage.  IBGard helping,  taking it twice daily but still having post lunch and post dinner cramping  with stools alternating between very loose and semi formed.  More loose lately.  He  Has not been contacted by [facility] (referral made Jan 6 )  4) OSA:  positive study last fall. He has been  wearing her CPAP every night since late October a minimum of 6 hours per night and notes improved daytime wakefulness and decreased fatigue  Review of Symptoms  Patient denies headache, fevers, malaise, unintentional weight loss, skin rash, eye pain, sinus congestion and sinus pain, sore throat, dysphagia,  hemoptysis , cough, dyspnea, wheezing, chest pain, palpitations, orthopnea, edema, abdominal pain, nausea, melena, diarrhea, constipation, flank pain, dysuria, hematuria, urinary  Frequency, nocturia, numbness, tingling, seizures,  Focal weakness, Loss of consciousness,  Tremor, insomnia, depression, anxiety, and suicidal  ideation.    Physical Exam:  BP 104/78 (BP Location: Left Arm, Patient Position: Sitting, Cuff Size: Normal)    Pulse 66    Temp 97.8 F (36.6 C) (Oral)    Ht 6\' 2"  (1.88 m)    Wt 172 lb 12.8 oz (78.4 kg)    SpO2 99%    BMI 22.19 kg/m     General appearance: alert, cooperative and appears stated age Ears: normal TM's and external ear canals both ears Throat: lips, mucosa, and tongue normal; teeth and gums normal Neck: no adenopathy, no carotid bruit, supple, symmetrical, trachea midline and thyroid not enlarged, symmetric, no tenderness/mass/nodules Back: symmetric, no curvature. ROM normal. No CVA tenderness. Lungs: clear to auscultation bilaterally Heart: regular rate and rhythm, S1, S2 normal, no murmur, click, rub or gallop Abdomen: soft, non-tender; bowel sounds normal; no masses,  no organomegaly Pulses: 2+ and symmetric Skin: Skin color, texture, turgor normal. No rashes or lesions Lymph nodes: Cervical, supraclavicular, and axillary nodes normal.   Assessment and Plan:  OSA (obstructive sleep apnea) Diagnosed by prior sleep study. Patient is using CPAP every night a minimum of 6 hours per night and notes improved daytime wakefulness and decreased fatigue   Nonalcoholic fatty liver disease without nonalcoholic steatohepatitis (NASH) LFTs normal  Except for mild stable elevation of T Bili  No significant change during use of lamisil.  He is no longer using the medication.    Lab Results  Component Value Date   ALT 16 01/07/2021   AST 15 01/07/2021   ALKPHOS 49 01/07/2021   BILITOT 0.8 01/07/2021     Encounter for preventive health examination age appropriate education and counseling updated, referrals for preventative services and immunizations addressed, dietary and smoking counseling addressed, most recent labs reviewed.  I have personally reviewed and have noted:   1) the patient's medical and social history 2) The pt's use of alcohol, tobacco, and illicit drugs 3)  The patient's current medications and supplements 4) Functional ability including ADL's, fall risk, home safety risk, hearing and visual impairment 5) Diet and physical activities 6) Evidence for depression or mood disorder 7) The patient's height, weight, and BMI have been recorded in the chart    I have made referrals, and provided counseling and education based on review of the above  IBS (irritable bowel syndrome) Slightly improved with use of IBGard.  Dicyclomine dose increased to 20 mg for lack of effect.  Referral to GI  Updated Medication List Outpatient Encounter Medications as of 01/08/2022  Medication Sig   ALPRAZolam (XANAX) 0.25 MG tablet Take 1 tablet (0.25 mg total) by mouth at bedtime as needed for anxiety.   azelastine (ASTELIN) 0.1 % nasal spray Place into both nostrils 2 (two) times daily. Use in each nostril as directed   dicyclomine (BENTYL) 20 MG tablet Take 1 tablet (20 mg total) by mouth 2 (two) times daily. Before lunch and dinner   montelukast (SINGULAIR) 10 MG tablet TAKE 1 TABLET BY MOUTH EVERY DAY AT BEDTIME   Multiple Vitamins-Minerals (MULTIVITAMIN ADULTS PO) Take by mouth.   Probiotic Product (PROBIOTIC DAILY PO)  Take by mouth.   Simethicone (GAS-X PO) Take by mouth.   [DISCONTINUED] dicyclomine (BENTYL) 10 MG capsule TAKE 1 CAPSULE BY MOUTH FOUR TIMES A DAY, BEFORE MEALS AND BEDTIME   [DISCONTINUED] pantoprazole (PROTONIX) 40 MG tablet Take 1 tablet (40 mg total) by mouth daily.   [DISCONTINUED] PARoxetine (PAXIL) 10 MG tablet TAKE 1 TABLET (10 MG TOTAL) BY MOUTH DAILY WITH SUPPER.   pantoprazole (PROTONIX) 40 MG tablet Take 1 tablet (40 mg total) by mouth daily.   PARoxetine (PAXIL) 20 MG tablet Take 1 tablet (20 mg total) by mouth daily with supper.   [DISCONTINUED] benzonatate (TESSALON) 100 MG capsule Take 2 capsules (200 mg total) by mouth every 8 (eight) hours. (Patient not taking: Reported on 01/08/2022)   [DISCONTINUED] ipratropium (ATROVENT) 0.06 %  nasal spray Place 2 sprays into both nostrils 4 (four) times daily. (Patient not taking: Reported on 01/08/2022)   [DISCONTINUED] promethazine-dextromethorphan (PROMETHAZINE-DM) 6.25-15 MG/5ML syrup Take 5 mLs by mouth 4 (four) times daily as needed. (Patient not taking: Reported on 01/08/2022)   No facility-administered encounter medications on file as of 01/08/2022.

## 2022-01-08 NOTE — Assessment & Plan Note (Signed)
Slightly improved with use of IBGard.  Dicyclomine dose increased to 20 mg for lack of effect.  Referral to GI

## 2022-01-08 NOTE — Patient Instructions (Signed)
I have increased your Paxil dose to 20 mg to help manage your depression  I have increased the dicyclomine to 20 mg to help the spasms you have after eating   Your appt with Dr Mia Creek is Feb 15 at 9:30 at [facility] (he is the gastroenterologist)

## 2022-01-26 DIAGNOSIS — G4733 Obstructive sleep apnea (adult) (pediatric): Secondary | ICD-10-CM | POA: Diagnosis not present

## 2022-01-27 ENCOUNTER — Encounter: Payer: Self-pay | Admitting: Internal Medicine

## 2022-01-28 DIAGNOSIS — G4733 Obstructive sleep apnea (adult) (pediatric): Secondary | ICD-10-CM | POA: Diagnosis not present

## 2022-02-23 DIAGNOSIS — G4733 Obstructive sleep apnea (adult) (pediatric): Secondary | ICD-10-CM | POA: Diagnosis not present

## 2022-02-25 DIAGNOSIS — G4733 Obstructive sleep apnea (adult) (pediatric): Secondary | ICD-10-CM | POA: Diagnosis not present

## 2022-02-28 DIAGNOSIS — K219 Gastro-esophageal reflux disease without esophagitis: Secondary | ICD-10-CM | POA: Diagnosis not present

## 2022-02-28 DIAGNOSIS — K582 Mixed irritable bowel syndrome: Secondary | ICD-10-CM | POA: Diagnosis not present

## 2022-03-11 ENCOUNTER — Encounter: Payer: Self-pay | Admitting: Internal Medicine

## 2022-03-14 ENCOUNTER — Encounter: Payer: Self-pay | Admitting: Internal Medicine

## 2022-03-14 ENCOUNTER — Ambulatory Visit (INDEPENDENT_AMBULATORY_CARE_PROVIDER_SITE_OTHER): Payer: 59 | Admitting: Internal Medicine

## 2022-03-14 DIAGNOSIS — K582 Mixed irritable bowel syndrome: Secondary | ICD-10-CM | POA: Diagnosis not present

## 2022-03-14 DIAGNOSIS — F411 Generalized anxiety disorder: Secondary | ICD-10-CM | POA: Diagnosis not present

## 2022-03-14 DIAGNOSIS — R197 Diarrhea, unspecified: Secondary | ICD-10-CM | POA: Diagnosis not present

## 2022-03-14 DIAGNOSIS — L02419 Cutaneous abscess of limb, unspecified: Secondary | ICD-10-CM | POA: Insufficient documentation

## 2022-03-14 DIAGNOSIS — F430 Acute stress reaction: Secondary | ICD-10-CM

## 2022-03-14 DIAGNOSIS — L03119 Cellulitis of unspecified part of limb: Secondary | ICD-10-CM | POA: Diagnosis not present

## 2022-03-14 DIAGNOSIS — J301 Allergic rhinitis due to pollen: Secondary | ICD-10-CM | POA: Diagnosis not present

## 2022-03-14 DIAGNOSIS — R69 Illness, unspecified: Secondary | ICD-10-CM | POA: Diagnosis not present

## 2022-03-14 MED ORDER — DOXYCYCLINE HYCLATE 100 MG PO TABS: 100.0000 mg | ORAL_TABLET | Freq: Two times a day (BID) | ORAL | 0 refills | Status: DC

## 2022-03-14 MED ORDER — TRIAMCINOLONE ACETONIDE 0.1 % EX CREA: 1.0000 "application " | TOPICAL_CREAM | Freq: Two times a day (BID) | CUTANEOUS | 0 refills | Status: DC

## 2022-03-14 MED ORDER — PREDNISONE 10 MG PO TABS: ORAL_TABLET | ORAL | 0 refills | Status: DC

## 2022-03-14 NOTE — Assessment & Plan Note (Signed)
Symptoms improved with increase in paxil dose to 20 mg daily.  Using alprazolam rarely ?

## 2022-03-14 NOTE — Assessment & Plan Note (Signed)
Seeing [facility] GI.  Regimen changed from dicyclomine  to levsin  And bulk forming laxatives  ?

## 2022-03-14 NOTE — Assessment & Plan Note (Signed)
Advised to increase zyrtec to twice daily  ?

## 2022-03-14 NOTE — Patient Instructions (Addendum)
THE RASH IS DUE TO INSECT BITES (CHIGGERS, MOST LIKELY )  ? ?INCREASE ZYRTEC TO EVERY 8 HOURS AND USE THE TRIAMCINOLONE CREAM TWICE DAILY  FOR ITCHING  ? ?YOU HAVE DEVELOPED A MILD CELLULITIS (SKIN INFECTION FROM SCRATCHING) SO START THE DOXYCYCLINE AND TAKE IT TWICE DAILY FOR 7 DAYS  ? ? ?PREDNISONE 6 MG DAILY X 3,  THEN REDUCE BY TEN MG EACH  DAY UNTIL GONE ? ?DO NOT TAKE YOUR POWDERED LAXATIVE WITHIN 2 HOURS OF YOUR OTHER PILLS; BECAUSE IT MAY INTERFERE WITH THE WAY YOUR OTHER MEDICATIONS GET ABSORBED  ? ? ?

## 2022-03-14 NOTE — Assessment & Plan Note (Signed)
SECONDARY TO INFECTED CHIGGER BITES .  Doxycycline prednisone taper,  Zyrtec and triamcinolone prescribed ?

## 2022-03-14 NOTE — Progress Notes (Signed)
? ?Subjective:  ?Patient ID: Christian Montgomery, male    DOB: [birth year]  Age: 31 y.o. MRN: [id number] ? ?CC: Diagnoses of Cellulitis and abscess of lower extremity, Seasonal allergic rhinitis due to pollen, Anxiety as acute reaction to exceptional stress, and Irritable bowel syndrome with both constipation and diarrhea were pertinent to this visit. ? ? ?This visit occurred during the SARS-CoV-2 public health emergency.  Safety protocols were in place, including screening questions prior to the visit, additional usage of staff PPE, and extensive cleaning of exam room while observing appropriate contact time as indicated for disinfecting solutions.   ? ?HPI ?[name] presents for follow up on GAD/insomnia,  IBS and new onset rash  ?Chief Complaint  ?Patient presents with  ? Rash  ?  Rash that is located on both lower legs about mid calf to the top of his feet x 2 weeks. No new detergents, soaps, no new socks.  ? ?2 week history of intensely pruritic rash affecting bottom third of legs and tops of feet.  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  No known history of insect bites.  But a fellow coworker had similar symptoms and chigger bites were suspected.  . No fevers or headache.  No rash elsewhere.   No change in soaps, products.  Using benadryl cream   ? ?GAD:  triggered by life stressors (recurrent fetal losses).  Feels better on current paxil dose.  Some days still feels overwhelmed  by home stressors, but enjoys his work and remains devoted to his wife He is uing alprazolam sparingly to sleep ? ?IBS:  seeing [facility].  Having soft to loose stools several times daily. Workup in progress ? ? ?Outpatient Medications Prior to Visit  ?Medication Sig Dispense Refill  ? ALPRAZolam (XANAX) 0.25 MG tablet Take 1 tablet (0.25 mg total) by mouth at bedtime as needed for anxiety. 30 tablet 1  ? azelastine (ASTELIN) 0.1 % nasal spray Place into both nostrils 2 (two) times daily. Use in each nostril as directed    ?  dicyclomine (BENTYL) 20 MG tablet Take 1 tablet (20 mg total) by mouth 2 (two) times daily. Before lunch and dinner 60 tablet 2  ? hyoscyamine (LEVSIN SL) 0.125 MG SL tablet Place under the tongue.    ? montelukast (SINGULAIR) 10 MG tablet TAKE 1 TABLET BY MOUTH EVERY DAY AT BEDTIME 90 tablet 3  ? Multiple Vitamins-Minerals (MULTIVITAMIN ADULTS PO) Take by mouth.    ? pantoprazole (PROTONIX) 40 MG tablet Take 1 tablet (40 mg total) by mouth daily. 90 tablet 2  ? PARoxetine (PAXIL) 20 MG tablet Take 1 tablet (20 mg total) by mouth daily with supper. 90 tablet 0  ? Probiotic Product (PROBIOTIC DAILY PO) Take by mouth.    ? Simethicone (GAS-X PO) Take by mouth.    ? ?No facility-administered medications prior to visit.  ? ? ?Review of Systems; ? ?Patient denies headache, fevers, malaise, unintentional weight loss, skin rash, eye pain, sinus congestion and sinus pain, sore throat, dysphagia,  hemoptysis , cough, dyspnea, wheezing, chest pain, palpitations, orthopnea, edema, abdominal pain, nausea, melena, diarrhea, constipation, flank pain, dysuria, hematuria, urinary  Frequency, nocturia, numbness, tingling, seizures,  Focal weakness, Loss of consciousness,  Tremor, insomnia, depression, anxiety, and suicidal ideation.   ? ? ? ?Objective:  ?BP 108/74 (BP Location: Left Arm, Patient Position: Sitting, Cuff Size: Normal)   Pulse 82   Temp 97.8 ?F (36.6 ?C) (Oral)   Ht 6\' 2"  (  1.88 m)   Wt 174 lb 6.4 oz (79.1 kg)   SpO2 97%   BMI 22.39 kg/m?  ? ?BP Readings from Last 3 Encounters:  ?03/14/22 108/74  ?01/08/22 104/78  ?12/06/21 120/86  ? ? ?Wt Readings from Last 3 Encounters:  ?03/14/22 174 lb 6.4 oz (79.1 kg)  ?01/08/22 172 lb 12.8 oz (78.4 kg)  ?12/06/21 169 lb 9.6 oz (76.9 kg)  ? ? ?General appearance: alert, cooperative and appears stated age ?Ears: normal TM's and external ear canals both ears ?Throat: lips, mucosa, and tongue normal; teeth and gums normal ?Neck: no adenopathy, no carotid bruit, supple,  symmetrical, trachea midline and thyroid not enlarged, symmetric, no tenderness/mass/nodules ?Back: symmetric, no curvature. ROM normal. No CVA tenderness. ?Lungs: clear to auscultation bilaterally ?Heart: regular rate and rhythm, S1, S2 normal, no murmur, click, rub or gallop ?Abdomen: soft, non-tender; bowel sounds normal; no masses,  no organomegaly ?Pulses: 2+ and symmetric ?Skin: mutiple excoriated and bleeding papules on both lower extremities.  Mild erythema of skin with warmth noted.   ?Lymph nodes: Cervical, supraclavicular, and axillary nodes normal. ? ?Lab Results  ?Component Value Date  ? HGBA1C 5.5 01/06/2022  ? HGBA1C 5.4 05/27/2013  ? HGBA1C 5.4 09/21/2012  ? ? ?Lab Results  ?Component Value Date  ? CREATININE 0.90 01/06/2022  ? CREATININE 0.96 01/07/2021  ? CREATININE 1.13 01/23/2020  ? ? ?Lab Results  ?Component Value Date  ? WBC 3.7 (L) 01/06/2022  ? HGB 15.3 01/06/2022  ? HCT 45.8 01/06/2022  ? PLT 163.0 01/06/2022  ? GLUCOSE 97 01/06/2022  ? CHOL 139 01/06/2022  ? TRIG 55.0 01/06/2022  ? HDL 43.30 01/06/2022  ? LDLCALC 84 01/06/2022  ? ALT 16 01/07/2021  ? AST 15 01/07/2021  ? NA 140 01/06/2022  ? K 4.4 01/06/2022  ? CL 101 01/06/2022  ? CREATININE 0.90 01/06/2022  ? BUN 11 01/06/2022  ? CO2 31 01/06/2022  ? TSH 2.69 01/07/2021  ? INR 1.3 (H) 02/24/2014  ? HGBA1C 5.5 01/06/2022  ? ? ?No results found. ? ?Assessment & Plan:  ? ?Problem List Items Addressed This Visit   ? ? Allergic rhinitis  ?  Advised to increase zyrtec to twice daily  ?  ?  ? Anxiety as acute reaction to exceptional stress  ?  Symptoms improved with increase in paxil dose to 20 mg daily.  Using alprazolam rarely ?  ?  ? Cellulitis and abscess of lower extremity  ?  SECONDARY TO INFECTED CHIGGER BITES .  Doxycycline prednisone taper,  Zyrtec and triamcinolone prescribed ?  ?  ? IBS (irritable bowel syndrome)  ?  Seeing [facility].  Regimen changed from dicyclomine  to levsin  And bulk forming laxatives  ?  ?  ? Relevant  Medications  ? hyoscyamine (LEVSIN SL) 0.125 MG SL tablet  ? ? ?I spent a total of 34  minutes on this visit ,on the date of this encounter to include pre-visit review of patient's medical history,  with review of 01/08/2022  visit with me  and  his recent GI visit for IBS workup,  and most recent lab tests  Face-to-face time with the patient , and post visit ordering of testing and therapeutics.   ? ?Follow-up: No follow-ups on file. ? ? ?Sherlene Shams, MD ?

## 2022-03-24 DIAGNOSIS — R194 Change in bowel habit: Secondary | ICD-10-CM | POA: Diagnosis not present

## 2022-03-24 DIAGNOSIS — R140 Abdominal distension (gaseous): Secondary | ICD-10-CM | POA: Diagnosis not present

## 2022-03-24 DIAGNOSIS — K582 Mixed irritable bowel syndrome: Secondary | ICD-10-CM | POA: Diagnosis not present

## 2022-03-24 DIAGNOSIS — R197 Diarrhea, unspecified: Secondary | ICD-10-CM | POA: Diagnosis not present

## 2022-03-26 DIAGNOSIS — G4733 Obstructive sleep apnea (adult) (pediatric): Secondary | ICD-10-CM | POA: Diagnosis not present

## 2022-03-28 DIAGNOSIS — G4733 Obstructive sleep apnea (adult) (pediatric): Secondary | ICD-10-CM | POA: Diagnosis not present

## 2022-04-05 ENCOUNTER — Other Ambulatory Visit: Payer: Self-pay | Admitting: Internal Medicine

## 2022-04-06 ENCOUNTER — Other Ambulatory Visit: Payer: Self-pay | Admitting: Internal Medicine

## 2022-04-15 DIAGNOSIS — R197 Diarrhea, unspecified: Secondary | ICD-10-CM | POA: Diagnosis not present

## 2022-04-15 DIAGNOSIS — K6389 Other specified diseases of intestine: Secondary | ICD-10-CM | POA: Diagnosis not present

## 2022-04-15 DIAGNOSIS — R140 Abdominal distension (gaseous): Secondary | ICD-10-CM | POA: Diagnosis not present

## 2022-04-15 DIAGNOSIS — K5900 Constipation, unspecified: Secondary | ICD-10-CM | POA: Diagnosis not present

## 2022-04-15 DIAGNOSIS — R194 Change in bowel habit: Secondary | ICD-10-CM | POA: Diagnosis not present

## 2022-04-15 DIAGNOSIS — R1030 Lower abdominal pain, unspecified: Secondary | ICD-10-CM | POA: Diagnosis not present

## 2022-04-16 LAB — HM COLONOSCOPY

## 2022-04-25 DIAGNOSIS — G4733 Obstructive sleep apnea (adult) (pediatric): Secondary | ICD-10-CM | POA: Diagnosis not present

## 2022-04-27 DIAGNOSIS — G4733 Obstructive sleep apnea (adult) (pediatric): Secondary | ICD-10-CM | POA: Diagnosis not present

## 2022-04-30 ENCOUNTER — Other Ambulatory Visit: Payer: Self-pay | Admitting: Internal Medicine

## 2022-05-01 MED ORDER — TRIAMCINOLONE ACETONIDE 0.1 % EX CREA: 1.0000 "application " | TOPICAL_CREAM | Freq: Two times a day (BID) | CUTANEOUS | 0 refills | Status: DC

## 2022-05-09 ENCOUNTER — Other Ambulatory Visit: Payer: Self-pay | Admitting: Internal Medicine

## 2022-05-09 ENCOUNTER — Ambulatory Visit: Payer: 59 | Admitting: Internal Medicine

## 2022-05-09 MED ORDER — PAROXETINE HCL 20 MG PO TABS: 20.0000 mg | ORAL_TABLET | Freq: Every day | ORAL | 0 refills | Status: DC

## 2022-05-19 ENCOUNTER — Ambulatory Visit: Payer: 59 | Admitting: Internal Medicine

## 2022-05-20 ENCOUNTER — Ambulatory Visit: Payer: 59 | Admitting: Internal Medicine

## 2022-05-22 ENCOUNTER — Ambulatory Visit (INDEPENDENT_AMBULATORY_CARE_PROVIDER_SITE_OTHER): Payer: 59 | Admitting: Internal Medicine

## 2022-05-22 ENCOUNTER — Encounter: Payer: Self-pay | Admitting: Internal Medicine

## 2022-05-22 VITALS — BP 116/78 | HR 76 | Temp 97.6°F | Ht 74.0 in | Wt 184.0 lb

## 2022-05-22 DIAGNOSIS — R42 Dizziness and giddiness: Secondary | ICD-10-CM | POA: Diagnosis not present

## 2022-05-22 DIAGNOSIS — E538 Deficiency of other specified B group vitamins: Secondary | ICD-10-CM

## 2022-05-22 DIAGNOSIS — J301 Allergic rhinitis due to pollen: Secondary | ICD-10-CM | POA: Diagnosis not present

## 2022-05-22 DIAGNOSIS — K580 Irritable bowel syndrome with diarrhea: Secondary | ICD-10-CM | POA: Diagnosis not present

## 2022-05-22 LAB — CBC WITH DIFFERENTIAL/PLATELET
Basophils Absolute: 0 10*3/uL (ref 0.0–0.1)
Basophils Relative: 0.7 % (ref 0.0–3.0)
Eosinophils Absolute: 0.2 10*3/uL (ref 0.0–0.7)
Eosinophils Relative: 5.8 % — ABNORMAL HIGH (ref 0.0–5.0)
HCT: 45.1 % (ref 39.0–52.0)
Hemoglobin: 14.9 g/dL (ref 13.0–17.0)
Lymphocytes Relative: 15.9 % (ref 12.0–46.0)
Lymphs Abs: 0.6 10*3/uL — ABNORMAL LOW (ref 0.7–4.0)
MCHC: 33 g/dL (ref 30.0–36.0)
MCV: 92.4 fl (ref 78.0–100.0)
Monocytes Absolute: 0.5 10*3/uL (ref 0.1–1.0)
Monocytes Relative: 13.3 % — ABNORMAL HIGH (ref 3.0–12.0)
Neutro Abs: 2.6 10*3/uL (ref 1.4–7.7)
Neutrophils Relative %: 64.3 % (ref 43.0–77.0)
Platelets: 172 10*3/uL (ref 150.0–400.0)
RBC: 4.87 Mil/uL (ref 4.22–5.81)
RDW: 13 % (ref 11.5–15.5)
WBC: 4 10*3/uL (ref 4.0–10.5)

## 2022-05-22 LAB — C-REACTIVE PROTEIN: CRP: <1 mg/dL (ref 0.5–20.0)

## 2022-05-22 LAB — B12 AND FOLATE PANEL
Folate: 14.2 ng/mL (ref >5.9)
Vitamin B-12: 289 pg/mL (ref 211–911)

## 2022-05-22 LAB — SEDIMENTATION RATE: Sed Rate: 2 mm/hr (ref 0–15)

## 2022-05-22 LAB — TSH: TSH: 1.17 u[IU]/mL (ref 0.35–5.50)

## 2022-05-22 NOTE — Assessment & Plan Note (Signed)
Managed with azelastin, Singulair and xyrtec/

## 2022-05-22 NOTE — Assessment & Plan Note (Signed)
Managed with hi fiber supplementation and lo FODMAP diet.  Colonoscopy was normal May 2023

## 2022-05-22 NOTE — Progress Notes (Signed)
Subjective:  Patient ID: Christian Montgomery, male    DOB: 06-Feb-1991  Age: 31 y.o. MRN: [id number]  CC: The primary encounter diagnosis was Dizziness. Diagnoses of Irritable bowel syndrome with diarrhea, Seasonal allergic rhinitis due to pollen, and Light headedness were also pertinent to this visit.   HPI Christian Montgomery is a 31 yr old with history of OSA , IBS,  allergic rhinitis  and GAD presents for evaluation of positional dizziness that has been recurring for the past several months. Initially the symptoms were infrequent  and would last less than half a day and not recur for weeks  . However for the past several months the symptom have been occurring daily .  He denies true vertigo,  but feel that when he is walking he is veering to the left lasting for entire  Chief Complaint  Patient presents with   Acute Visit    Dizzy and lightheaded when turning head side to side and with position change   Orthostatic vital signs were normal with no significant change in BP or pulse today  Colonoscopy done Apr 15 2022 normal. Weight gain of 10 lbs since April visit  Stool consistency has improved with a high fiber pill.  Still has loose stools   allergic rhinitis: taking zyrtec bid  astelin and singulair with improved control.       Outpatient Medications Prior to Visit  Medication Sig Dispense Refill   ALPRAZolam (XANAX) 0.25 MG tablet Take 1 tablet (0.25 mg total) by mouth at bedtime as needed for anxiety. 30 tablet 1   azelastine (ASTELIN) 0.1 % nasal spray Place into both nostrils 2 (two) times daily. Use in each nostril as directed     dicyclomine (BENTYL) 20 MG tablet TAKE 1 TABLET (20 MG TOTAL) BY MOUTH 2 (TWO) TIMES DAILY. BEFORE LUNCH AND DINNER 180 tablet 0   doxycycline (VIBRA-TABS) 100 MG tablet Take 1 tablet (100 mg total) by mouth 2 (two) times daily. 14 tablet 0   hyoscyamine (LEVSIN SL) 0.125 MG SL tablet Place under the tongue.     montelukast (SINGULAIR) 10 MG tablet TAKE 1  TABLET BY MOUTH EVERY DAY AT BEDTIME 90 tablet 3   Multiple Vitamins-Minerals (MULTIVITAMIN ADULTS PO) Take by mouth.     pantoprazole (PROTONIX) 40 MG tablet Take 1 tablet (40 mg total) by mouth daily. 90 tablet 2   PARoxetine (PAXIL) 20 MG tablet Take 1 tablet (20 mg total) by mouth daily with supper. 90 tablet 0   predniSONE (DELTASONE) 10 MG tablet 6 tablets on Day 1 , then reduce by 1 tablet daily until gone 21 tablet 0   Probiotic Product (PROBIOTIC DAILY PO) Take by mouth.     Simethicone (GAS-X PO) Take by mouth.     triamcinolone cream (KENALOG) 0.1 % Apply 1 application. topically 2 (two) times daily. 80 g 0   No facility-administered medications prior to visit.    Review of Systems;  Patient denies headache, fevers, malaise, unintentional weight loss, skin rash, eye pain, sinus congestion and sinus pain, sore throat, dysphagia,  hemoptysis , cough, dyspnea, wheezing, chest pain, palpitations, orthopnea, edema, abdominal pain, nausea, melena, diarrhea, constipation, flank pain, dysuria, hematuria, urinary  Frequency, nocturia, numbness, tingling, seizures,  Focal weakness, Loss of consciousness,  Tremor, insomnia, depression, anxiety, and suicidal ideation.      Objective:  BP 116/78 (BP Location: Left Arm, Patient Position: Sitting, Cuff Size: Normal)   Pulse 76   Temp 97.6 F (36.4  C) (Oral)   Ht 6\' 2"  (1.88 m)   Wt 184 lb (83.5 kg)   SpO2 97%   BMI 23.62 kg/m   BP Readings from Last 3 Encounters:  05/22/22 116/78  03/14/22 108/74  01/08/22 104/78    Wt Readings from Last 3 Encounters:  05/22/22 184 lb (83.5 kg)  03/14/22 174 lb 6.4 oz (79.1 kg)  01/08/22 172 lb 12.8 oz (78.4 kg)    General appearance: alert, cooperative and appears stated age HEENT:  Pupils reactive but remain quite dilated in bright light  normal TM's and external ear canals both ears Throat: lips, mucosa, and tongue normal; teeth and gums normal Neck: no adenopathy, no carotid bruit, supple,  symmetrical, trachea midline and thyroid not enlarged, symmetric, no tenderness/mass/nodules Back: symmetric, no curvature. ROM normal. No CVA tenderness. Lungs: clear to auscultation bilaterally Heart: regular rate and rhythm, S1, S2 normal, no murmur, click, rub or gallop Abdomen: soft, non-tender; bowel sounds normal; no masses,  no organomegaly Pulses: 2+ and symmetric Skin: Skin color, texture, turgor normal. No rashes or lesions Lymph nodes: Cervical, supraclavicular, and axillary nodes normal.  Lab Results  Component Value Date   HGBA1C 5.5 01/06/2022   HGBA1C 5.4 05/27/2013   HGBA1C 5.4 09/21/2012    Lab Results  Component Value Date   CREATININE 0.90 01/06/2022   CREATININE 0.96 01/07/2021   CREATININE 1.13 01/23/2020    Lab Results  Component Value Date   WBC 3.7 (L) 01/06/2022   HGB 15.3 01/06/2022   HCT 45.8 01/06/2022   PLT 163.0 01/06/2022   GLUCOSE 97 01/06/2022   CHOL 139 01/06/2022   TRIG 55.0 01/06/2022   HDL 43.30 01/06/2022   LDLCALC 84 01/06/2022   ALT 16 01/07/2021   AST 15 01/07/2021   NA 140 01/06/2022   K 4.4 01/06/2022   CL 101 01/06/2022   CREATININE 0.90 01/06/2022   BUN 11 01/06/2022   CO2 31 01/06/2022   TSH 2.69 01/07/2021   INR 1.3 (H) 02/24/2014   HGBA1C 5.5 01/06/2022    No results found.  Assessment & Plan:   Problem List Items Addressed This Visit     Light headedness    He denies true vertigo ,  But reports daily symptoms of feeling off balance and "veering to the right" when walking.  Neurologic exam is normal except for inability to stand on one leg for more than 5 seconds, and pupils that react to light but remain dilated .  Given the absence of headaches and tinnitus, an MRI is not warranted.  Will rule out B12 deficiency, and refer to ENT for evaluation       Allergic rhinitis    Managed with azelastin, Singulair and xyrtec/      IBS (irritable bowel syndrome)    Managed with hi fiber supplementation and lo FODMAP  diet.  Colonoscopy was normal May 2023       Other Visit Diagnoses     Dizziness    -  Primary   Relevant Orders   TSH   B12 and Folate Panel   CBC with Differential/Platelet   Sedimentation rate   C-reactive protein       I spent a total of June 2023 with this patient in a face to face visit on the date of this encounter reviewing the last office visit with me in February,  his  most recent with patient's gastroenterologist and his colonoscopy report done in May n    ,  patient's  diet and eating habits,  and post visit ordering of testing and therapeutics.    Follow-up: No follow-ups on file.   Sherlene Shams, MD

## 2022-05-22 NOTE — Patient Instructions (Addendum)
If your labs today are normal,  I will make a referral to an ENT specialist to treat you for  vertigo

## 2022-05-22 NOTE — Assessment & Plan Note (Signed)
He denies true vertigo ,  But reports daily symptoms of feeling off balance and "veering to the right" when walking.  Neurologic exam is normal except for inability to stand on one leg for more than 5 seconds, and pupils that react to light but remain dilated .  Given the absence of headaches and tinnitus, an MRI is not warranted.  Will rule out B12 deficiency, and refer to ENT for evaluation

## 2022-05-24 DIAGNOSIS — E538 Deficiency of other specified B group vitamins: Secondary | ICD-10-CM | POA: Insufficient documentation

## 2022-05-26 ENCOUNTER — Ambulatory Visit: Payer: 59 | Admitting: Internal Medicine

## 2022-05-26 DIAGNOSIS — G4733 Obstructive sleep apnea (adult) (pediatric): Secondary | ICD-10-CM | POA: Diagnosis not present

## 2022-05-27 ENCOUNTER — Telehealth: Payer: Self-pay | Admitting: Internal Medicine

## 2022-05-27 NOTE — Telephone Encounter (Signed)
Pt called in requesting results... Pt requesting callback.Christian KitchenMarland Montgomery

## 2022-05-27 NOTE — Telephone Encounter (Signed)
Spoke with pt and went over his lab results with him. Pt gave a verbal understanding.

## 2022-05-28 DIAGNOSIS — G4733 Obstructive sleep apnea (adult) (pediatric): Secondary | ICD-10-CM | POA: Diagnosis not present

## 2022-05-30 ENCOUNTER — Ambulatory Visit: Payer: 59

## 2022-05-30 ENCOUNTER — Ambulatory Visit (INDEPENDENT_AMBULATORY_CARE_PROVIDER_SITE_OTHER): Payer: 59

## 2022-05-30 DIAGNOSIS — E538 Deficiency of other specified B group vitamins: Secondary | ICD-10-CM | POA: Diagnosis not present

## 2022-05-30 MED ORDER — CYANOCOBALAMIN 1000 MCG/ML IJ SOLN
1000.0000 ug | Freq: Once | INTRAMUSCULAR | Status: AC
  Administered 2022-05-30: 1000 ug via INTRAMUSCULAR

## 2022-05-30 NOTE — Progress Notes (Signed)
Patient presented for B 12 injection to left deltoid, patient voiced no concerns nor showed any signs of distress during injection. 

## 2022-06-02 ENCOUNTER — Encounter: Payer: Self-pay | Admitting: Internal Medicine

## 2022-06-03 LAB — INTRINSIC FACTOR ANTIBODIES: Intrinsic Factor: POSITIVE — AB

## 2022-06-04 NOTE — Telephone Encounter (Signed)
Pt returning call.... Advised pt of below note.... Pt was wondering if he can wait to be seen by his provider or the situation need to be address now... Pt requesting callback

## 2022-06-04 NOTE — Telephone Encounter (Signed)
LMTCB to see if patient willing to see someone at [facility] office today or go to UC to be checked out.

## 2022-06-04 NOTE — Telephone Encounter (Signed)
Noted. Will evaluate him in office

## 2022-06-05 ENCOUNTER — Ambulatory Visit: Payer: 59 | Admitting: Nurse Practitioner

## 2022-06-06 ENCOUNTER — Encounter: Payer: Self-pay | Admitting: Internal Medicine

## 2022-06-06 ENCOUNTER — Ambulatory Visit (INDEPENDENT_AMBULATORY_CARE_PROVIDER_SITE_OTHER): Payer: 59

## 2022-06-06 DIAGNOSIS — E538 Deficiency of other specified B group vitamins: Secondary | ICD-10-CM

## 2022-06-06 MED ORDER — CYANOCOBALAMIN 1000 MCG/ML IJ SOLN
1000.0000 ug | Freq: Once | INTRAMUSCULAR | Status: AC
  Administered 2022-06-06: 1000 ug via INTRAMUSCULAR

## 2022-06-06 NOTE — Progress Notes (Signed)
Patient presented for B 12 injection to right deltoid, patient voiced no concerns nor showed any signs of distress during injection. 

## 2022-06-13 ENCOUNTER — Ambulatory Visit: Payer: 59 | Admitting: Internal Medicine

## 2022-06-13 ENCOUNTER — Ambulatory Visit (INDEPENDENT_AMBULATORY_CARE_PROVIDER_SITE_OTHER): Payer: 59

## 2022-06-13 DIAGNOSIS — E538 Deficiency of other specified B group vitamins: Secondary | ICD-10-CM

## 2022-06-13 MED ORDER — CYANOCOBALAMIN 1000 MCG/ML IJ SOLN
1000.0000 ug | Freq: Once | INTRAMUSCULAR | Status: AC
  Administered 2022-06-13: 1000 ug via INTRAMUSCULAR

## 2022-06-13 NOTE — Progress Notes (Signed)
presents today for injection per MD orders. B12 injection administered IM in right Upper Arm. Administration without incident. Patient tolerated well.  Donoven Pett,cma  

## 2022-06-20 ENCOUNTER — Ambulatory Visit (INDEPENDENT_AMBULATORY_CARE_PROVIDER_SITE_OTHER): Payer: 59

## 2022-06-20 DIAGNOSIS — E538 Deficiency of other specified B group vitamins: Secondary | ICD-10-CM | POA: Diagnosis not present

## 2022-06-20 MED ORDER — CYANOCOBALAMIN 1000 MCG/ML IJ SOLN
1000.0000 ug | Freq: Once | INTRAMUSCULAR | Status: AC
  Administered 2022-06-20: 1000 ug via INTRAMUSCULAR

## 2022-06-20 NOTE — Progress Notes (Signed)
Pt presents today for b12 injection. Left deltoid, IM. Pt voiced no concerns nor showed any signs of distress during injection.  

## 2022-06-24 DIAGNOSIS — G4733 Obstructive sleep apnea (adult) (pediatric): Secondary | ICD-10-CM | POA: Diagnosis not present

## 2022-07-04 ENCOUNTER — Ambulatory Visit: Payer: 59 | Admitting: Internal Medicine

## 2022-07-07 ENCOUNTER — Ambulatory Visit: Payer: 59 | Admitting: Internal Medicine

## 2022-07-08 ENCOUNTER — Ambulatory Visit: Payer: 59 | Admitting: Internal Medicine

## 2022-07-09 ENCOUNTER — Ambulatory Visit (INDEPENDENT_AMBULATORY_CARE_PROVIDER_SITE_OTHER): Payer: 59 | Admitting: Internal Medicine

## 2022-07-09 ENCOUNTER — Encounter: Payer: Self-pay | Admitting: Internal Medicine

## 2022-07-09 VITALS — BP 126/82 | HR 88 | Temp 98.6°F | Ht 74.0 in | Wt 181.8 lb

## 2022-07-09 DIAGNOSIS — R42 Dizziness and giddiness: Secondary | ICD-10-CM

## 2022-07-09 DIAGNOSIS — E538 Deficiency of other specified B group vitamins: Secondary | ICD-10-CM | POA: Diagnosis not present

## 2022-07-09 DIAGNOSIS — I471 Supraventricular tachycardia: Secondary | ICD-10-CM

## 2022-07-09 MED ORDER — CYANOCOBALAMIN 1000 MCG/ML IJ SOLN
1000.0000 ug | Freq: Once | INTRAMUSCULAR | Status: AC
  Administered 2022-07-09: 1000 ug via INTRAMUSCULAR

## 2022-07-09 MED ORDER — PROPRANOLOL HCL 10 MG PO TABS: 10.0000 mg | ORAL_TABLET | Freq: Three times a day (TID) | ORAL | 1 refills | Status: DC

## 2022-07-09 NOTE — Progress Notes (Signed)
Subjective:  Patient ID: Christian Montgomery, male    DOB: 21-Sep-1991  Age: 31 y.o. MRN: [id number]  CC: The primary encounter diagnosis was B12 deficiency. Diagnoses of Light headedness and Paroxysmal SVT (supraventricular tachycardia) (HCC) were also pertinent to this visit.   HPI Christian Montgomery presents for follow up on chronic conditions  Chief Complaint  Patient presents with   Follow-up   1) B12 deficiency,  IF antibody positive.  Found in June during workup for c/o disequilibrium without true vertigo.   he has completed  4 weekly doses.  But 2 weeks ago the  disequilibrium episodes started to become less frequent,now occurring for shorter periods of time.    2) last month had a 10 second episode of right sided  scalp pain c/w cervical  radiculitis in the C3 area .Marland Kitchen The episode occurred at work  when  head was tilted back against a head rest and arms were on the steering wheel.    3)  Sinus tach has started to have episodes of rapid pulse again , lasting up to 30 minutes.  Occurring nearly every day , not at night or when asleep .  Wife has conceived and he is still fearing that she will miscarry (2 prior).    Outpatient Medications Prior to Visit  Medication Sig Dispense Refill   ALPRAZolam (XANAX) 0.25 MG tablet Take 1 tablet (0.25 mg total) by mouth at bedtime as needed for anxiety. 30 tablet 1   azelastine (ASTELIN) 0.1 % nasal spray Place into both nostrils 2 (two) times daily. Use in each nostril as directed     dicyclomine (BENTYL) 20 MG tablet TAKE 1 TABLET (20 MG TOTAL) BY MOUTH 2 (TWO) TIMES DAILY. BEFORE LUNCH AND DINNER 180 tablet 0   doxycycline (VIBRA-TABS) 100 MG tablet Take 1 tablet (100 mg total) by mouth 2 (two) times daily. 14 tablet 0   hyoscyamine (LEVSIN SL) 0.125 MG SL tablet Place under the tongue.     montelukast (SINGULAIR) 10 MG tablet TAKE 1 TABLET BY MOUTH EVERY DAY AT BEDTIME 90 tablet 3   Multiple Vitamins-Minerals (MULTIVITAMIN ADULTS PO) Take by mouth.      pantoprazole (PROTONIX) 40 MG tablet Take 1 tablet (40 mg total) by mouth daily. 90 tablet 2   PARoxetine (PAXIL) 20 MG tablet Take 1 tablet (20 mg total) by mouth daily with supper. 90 tablet 0   predniSONE (DELTASONE) 10 MG tablet 6 tablets on Day 1 , then reduce by 1 tablet daily until gone 21 tablet 0   Probiotic Product (PROBIOTIC DAILY PO) Take by mouth.     Simethicone (GAS-X PO) Take by mouth.     triamcinolone cream (KENALOG) 0.1 % Apply 1 application. topically 2 (two) times daily. 80 g 0   No facility-administered medications prior to visit.    Review of Systems;  Patient denies headache, fevers, malaise, unintentional weight loss, skin rash, eye pain, sinus congestion and sinus pain, sore throat, dysphagia,  hemoptysis , cough, dyspnea, wheezing, chest pain, palpitations, orthopnea, edema, abdominal pain, nausea, melena, diarrhea, constipation, flank pain, dysuria, hematuria, urinary  Frequency, nocturia, numbness, tingling, seizures,  Focal weakness, Loss of consciousness,  Tremor, insomnia, depression, anxiety, and suicidal ideation.      Objective:  BP 126/82 (BP Location: Left Arm, Patient Position: Sitting, Cuff Size: Normal)   Pulse 88   Temp 98.6 F (37 C) (Oral)   Ht 6\' 2"  (1.88 m)   Wt 181 lb 12.8 oz (82.5  kg)   SpO2 99%   BMI 23.34 kg/m   BP Readings from Last 3 Encounters:  07/09/22 126/82  05/22/22 116/78  03/14/22 108/74    Wt Readings from Last 3 Encounters:  07/09/22 181 lb 12.8 oz (82.5 kg)  05/22/22 184 lb (83.5 kg)  03/14/22 174 lb 6.4 oz (79.1 kg)    General appearance: alert, cooperative and appears stated age Ears: normal TM's and external ear canals both ears Throat: lips, mucosa, and tongue normal; teeth and gums normal Neck: no adenopathy, no carotid bruit, supple, symmetrical, trachea midline and thyroid not enlarged, symmetric, no tenderness/mass/nodules Back: symmetric, no curvature. ROM normal. No CVA tenderness. Lungs: clear to  auscultation bilaterally Heart: regular rate and rhythm, S1, S2 normal, no murmur, click, rub or gallop Abdomen: soft, non-tender; bowel sounds normal; no masses,  no organomegaly Pulses: 2+ and symmetric Skin: Skin color, texture, turgor normal. No rashes or lesions Lymph nodes: Cervical, supraclavicular, and axillary nodes normal.  Lab Results  Component Value Date   HGBA1C 5.5 01/06/2022   HGBA1C 5.4 05/27/2013   HGBA1C 5.4 09/21/2012    Lab Results  Component Value Date   CREATININE 0.90 01/06/2022   CREATININE 0.96 01/07/2021   CREATININE 1.13 01/23/2020    Lab Results  Component Value Date   WBC 4.0 05/22/2022   HGB 14.9 05/22/2022   HCT 45.1 05/22/2022   PLT 172.0 05/22/2022   GLUCOSE 97 01/06/2022   CHOL 139 01/06/2022   TRIG 55.0 01/06/2022   HDL 43.30 01/06/2022   LDLCALC 84 01/06/2022   ALT 16 01/07/2021   AST 15 01/07/2021   NA 140 01/06/2022   K 4.4 01/06/2022   CL 101 01/06/2022   CREATININE 0.90 01/06/2022   BUN 11 01/06/2022   CO2 31 01/06/2022   TSH 1.17 05/22/2022   INR 1.3 (H) 02/24/2014   HGBA1C 5.5 01/06/2022    No results found.  Assessment & Plan:   Problem List Items Addressed This Visit     Light headedness    Improved with supplementation of low B12       B12 deficiency - Primary    5th parenteral dose given today.  Wants to try oral supplements.  rec 2500 mcg dose DAILY and repeat level in November       Relevant Orders   Vitamin B12   Paroxysmal SVT (supraventricular tachycardia) (HCC)    Recurring after several years of quiescence.  Reviewed 2014 cardiac monitor.  Current episodes triggered by new of wife's pregnancy. Inderal prn prescribed.       Relevant Medications   propranolol (INDERAL) 10 MG tablet    I spent a total of  27 minutes with this patient in a face to face visit on the date of this encounter reviewing the last office visit with me in June,  recent labs,  2014 cardiac monitor,  post visit ordering of  testing and therapeutics.    Follow-up: No follow-ups on file.   Sherlene Shams, MD

## 2022-07-09 NOTE — Patient Instructions (Addendum)
I have prescribed propranolol to use for the episodes of rapid heart rate .  If your heart hasn't slowed down after 15-20 minutes,  take 1 more dose.    If you find that you are needing to take it more than once daily,  let me know and I will prescribe a longer acting form of the medication    You received a b12 injection today .  Please start taking an oral supplement 2500 mcG DAILY.  No more injections after today,  unless your level drops again when we  recheck your level in 3 months (make a lab visit)

## 2022-07-09 NOTE — Addendum Note (Signed)
Addended by: Sandy Salaam on: 07/09/2022 02:09 PM   Modules accepted: Orders

## 2022-07-09 NOTE — Assessment & Plan Note (Signed)
Improved with supplementation of low B12

## 2022-07-09 NOTE — Assessment & Plan Note (Signed)
5th parenteral dose given today.  Wants to try oral supplements.  rec 2500 mcg dose DAILY and repeat level in November

## 2022-07-09 NOTE — Assessment & Plan Note (Signed)
Recurring after several years of quiescence.  Reviewed 2014 cardiac monitor.  Current episodes triggered by new of wife's pregnancy. Inderal prn prescribed.

## 2022-07-25 ENCOUNTER — Ambulatory Visit: Payer: 59

## 2022-07-25 DIAGNOSIS — G4733 Obstructive sleep apnea (adult) (pediatric): Secondary | ICD-10-CM | POA: Diagnosis not present

## 2022-07-31 ENCOUNTER — Other Ambulatory Visit: Payer: Self-pay | Admitting: Internal Medicine

## 2022-08-18 ENCOUNTER — Other Ambulatory Visit: Payer: Self-pay | Admitting: Internal Medicine

## 2022-08-25 DIAGNOSIS — G4733 Obstructive sleep apnea (adult) (pediatric): Secondary | ICD-10-CM | POA: Diagnosis not present

## 2022-09-26 ENCOUNTER — Ambulatory Visit: Payer: 59 | Admitting: Internal Medicine

## 2022-10-01 ENCOUNTER — Other Ambulatory Visit: Payer: 59

## 2022-10-02 ENCOUNTER — Other Ambulatory Visit (INDEPENDENT_AMBULATORY_CARE_PROVIDER_SITE_OTHER): Payer: 59

## 2022-10-02 DIAGNOSIS — E538 Deficiency of other specified B group vitamins: Secondary | ICD-10-CM | POA: Diagnosis not present

## 2022-10-02 LAB — VITAMIN B12: Vitamin B-12: >1500 pg/mL — ABNORMAL HIGH (ref 211–911)

## 2022-10-03 ENCOUNTER — Other Ambulatory Visit: Payer: 59

## 2022-10-07 ENCOUNTER — Ambulatory Visit: Payer: 59 | Admitting: Internal Medicine

## 2022-10-10 DIAGNOSIS — H52223 Regular astigmatism, bilateral: Secondary | ICD-10-CM | POA: Diagnosis not present

## 2022-10-21 ENCOUNTER — Ambulatory Visit: Payer: 59 | Admitting: Internal Medicine

## 2022-10-22 ENCOUNTER — Ambulatory Visit: Payer: Self-pay

## 2022-10-22 ENCOUNTER — Ambulatory Visit
Admission: EM | Admit: 2022-10-22 | Discharge: 2022-10-22 | Disposition: A | Discharge status: Home / Self Care | Payer: 59 | Attending: Family Medicine | Admitting: Family Medicine

## 2022-10-22 DIAGNOSIS — S90852A Superficial foreign body, left foot, initial encounter: Secondary | ICD-10-CM

## 2022-10-22 DIAGNOSIS — Z23 Encounter for immunization: Secondary | ICD-10-CM | POA: Diagnosis not present

## 2022-10-22 DIAGNOSIS — S91302A Unspecified open wound, left foot, initial encounter: Secondary | ICD-10-CM

## 2022-10-22 MED ORDER — TETANUS-DIPHTH-ACELL PERTUSSIS 5-2.5-18.5 LF-MCG/0.5 IM SUSY
0.5000 mL | PREFILLED_SYRINGE | Freq: Once | INTRAMUSCULAR | Status: AC
  Administered 2022-10-22: 0.5 mL via INTRAMUSCULAR

## 2022-10-22 NOTE — Discharge Instructions (Addendum)
I would put triple antibiotic ointment on the spot where he dug out the splinter 2 times daily.  You are given a Tdap tetanus booster today

## 2022-10-22 NOTE — ED Provider Notes (Signed)
Renaldo Fiddler    CSN: [id number] Arrival date & time: 10/22/22  1234      History   Chief Complaint Chief Complaint  Patient presents with   Foreign Body in Skin    HPI Christian Montgomery is a 31 y.o. male.   HPI Here for splinter in his left foot.  When he was walking on his outdoor deck on November 19, he got a splinter in the sole of his left foot on the ball of the foot.  He has been able to get some out but he has a little speck still in  His last tetanus booster was 2016  Past Medical History:  Diagnosis Date   Anxiety    Dysautonomia/POTS 09/06/2013   Gastric ulcer with obstruction April 2013   hosp [facility]   GERD (gastroesophageal reflux disease)    Sullivan Lone syndrome    IBS (irritable bowel syndrome)    Tachycardia     Patient Active Problem List   Diagnosis Date Noted   Paroxysmal SVT (supraventricular tachycardia) 07/09/2022   B12 deficiency 05/24/2022   Astigmatism, bilateral 01/08/2022   Encounter for preventive health examination 01/08/2022   Family history of recurrent miscarriage 09/29/2021   OSA (obstructive sleep apnea) 02/24/2021   Acromioclavicular joint pain 02/03/2020   Grief associated with loss of fetus 10/21/2019   Cervical radiculopathy 11/18/2018   Anxiety as acute reaction to exceptional stress 09/16/2018   Screen for STD (sexually transmitted disease) 09/16/2018   IBS (irritable bowel syndrome) 12/30/2015   Allergic rhinitis 03/25/2013   Constipation - functional 10/14/2012   Nonalcoholic fatty liver disease without nonalcoholic steatohepatitis (NASH) 09/22/2012   Light headedness 09/22/2012   Personal history of gastric ulcer 03/01/2012    Past Surgical History:  Procedure Laterality Date   COLONOSCOPY     COSMETIC SURGERY     NASAL SEPTOPLASTY W/ TURBINOPLASTY Bilateral 12/25/2020   Procedure: NASAL SEPTOPLASTY WITH TURBINATE REDUCTION;  Surgeon: Geanie Logan, MD;  Location: [facility];  Service: ENT;   Laterality: Bilateral;       Home Medications    Prior to Admission medications   Medication Sig Start Date End Date Taking? Authorizing Provider  ALPRAZolam (XANAX) 0.25 MG tablet Take 1 tablet (0.25 mg total) by mouth at bedtime as needed for anxiety. 10/10/21   Sherlene Shams, MD  azelastine (ASTELIN) 0.1 % nasal spray Place into both nostrils 2 (two) times daily. Use in each nostril as directed    [provider]  dicyclomine (BENTYL) 20 MG tablet TAKE 1 TABLET (20 MG TOTAL) BY MOUTH 2 (TWO) TIMES DAILY. BEFORE LUNCH AND DINNER 04/07/22   Sherlene Shams, MD  hyoscyamine (LEVSIN SL) 0.125 MG SL tablet Place under the tongue. 02/28/22   [provider]  montelukast (SINGULAIR) 10 MG tablet TAKE 1 TABLET BY MOUTH EVERY DAY AT BEDTIME 08/16/20   Sherlene Shams, MD  Multiple Vitamins-Minerals (MULTIVITAMIN ADULTS PO) Take by mouth.    [provider]  pantoprazole (PROTONIX) 40 MG tablet Take 1 tablet (40 mg total) by mouth daily. 01/08/22   Sherlene Shams, MD  PARoxetine (PAXIL) 20 MG tablet Take 1 tablet (20 mg total) by mouth daily with supper. 05/09/22   Sherlene Shams, MD  predniSONE (DELTASONE) 10 MG tablet 6 tablets on Day 1 , then reduce by 1 tablet daily until gone 03/14/22   Sherlene Shams, MD  Probiotic Product (PROBIOTIC DAILY PO) Take by mouth.  [provider]  propranolol (INDERAL) 10 MG tablet TAKE 1 TABLET (10 MG TOTAL) BY MOUTH 3 (THREE) TIMES DAILY. AS NEEDED FOR RAPID HEART RATE 08/19/22   Sherlene Shams, MD  Simethicone (GAS-X PO) Take by mouth.    [provider]  triamcinolone cream (KENALOG) 0.1 % Apply 1 application. topically 2 (two) times daily. 05/01/22   Sherlene Shams, MD    Family History Family History  Problem Relation Age of Onset   COPD Father    Alcohol abuse Father    Heart disease Father    Diabetes Sister 2       gestational, not overweight    Diabetes Maternal Grandmother    Cancer Maternal  Grandmother        endometrial   Early death Maternal Grandmother        cause unknown   Heart failure Paternal Grandmother     Social History Social History   Tobacco Use   Smoking status: Never   Smokeless tobacco: Never  Vaping Use   Vaping Use: Never used  Substance Use Topics   Alcohol use: No   Drug use: No     Allergies   Milk-related compounds   Review of Systems Review of Systems   Physical Exam Triage Vital Signs ED Triage Vitals  Enc Vitals Group     BP 10/22/22 1435 121/78     Pulse Rate 10/22/22 1435 91     Resp 10/22/22 1435 18     Temp 10/22/22 1435 98.1 F (36.7 C)     Temp src --      SpO2 10/22/22 1435 97 %     Weight 10/22/22 1441 180 lb (81.6 kg)     Height 10/22/22 1441 6\' 2"  (1.88 m)     Head Circumference --      Peak Flow --      Pain Score 10/22/22 1439 2     Pain Loc --      Pain Edu? --      Excl. in GC? --    No data found.  Updated Vital Signs BP 121/78   Pulse 91   Temp 98.1 F (36.7 C)   Resp 18   Ht 6\' 2"  (1.88 m)   Wt 81.6 kg   SpO2 97%   BMI 23.11 kg/m   Visual Acuity Right Eye Distance:   Left Eye Distance:   Bilateral Distance:    Right Eye Near:   Left Eye Near:    Bilateral Near:     Physical Exam Vitals reviewed.  Constitutional:      General: He is not in acute distress.    Appearance: He is not ill-appearing, toxic-appearing or diaphoretic.  Skin:    Comments: There is a 1 to 2 mm circular wound where he is dug out some of the splinter.  There is no drainage and there is no induration erythema around it.  There is 1 tiny little black spot where there is some remaining foreign body, adjacent to the circular wound  Neurological:     Mental Status: He is alert and oriented to person, place, and time.      UC Treatments / Results  Labs (all labs ordered are listed, but only abnormal results are displayed) Labs Reviewed - No data to display  EKG   Radiology No results  found.  Procedures Procedures (including critical care time)  Medications Ordered in UC Medications - No data to display  Initial  Impression / Assessment and Plan / UC Course  I have reviewed the triage vital signs and the nursing notes.  Pertinent labs & imaging results that were available during my care of the patient were reviewed by me and considered in my medical decision making (see chart for details).        Discussed trying to get the rest of the foreign body out with some local anesthesia injected.  I discussed with him that it was a reasonable option to observe it as most likely this foreign body would work its way out.  He decided to just soak his foot in take care of the wound.  He did not want to have a procedure done today.  Tetanus is boosted today Final Clinical Impressions(s) / UC Diagnoses   Final diagnoses:  Foreign body in left foot, initial encounter  Wound of left foot     Discharge Instructions      I would put triple antibiotic ointment on the spot where he dug out the splinter 2 times daily.  You are given a Tdap tetanus booster today     ED Prescriptions   None    I have reviewed the PDMP during this encounter.   Zenia Resides, MD 10/22/22 1500

## 2022-10-22 NOTE — ED Triage Notes (Signed)
Patient to Urgent Care with complaints of a splinter present to the bottom of left foot. Reports stepping on it on Sunday, attempted to remove.   Last tdap: 2016.

## 2022-10-23 DIAGNOSIS — G4733 Obstructive sleep apnea (adult) (pediatric): Secondary | ICD-10-CM | POA: Diagnosis not present

## 2022-11-07 ENCOUNTER — Other Ambulatory Visit: Payer: Self-pay | Admitting: Internal Medicine

## 2022-11-07 ENCOUNTER — Encounter: Payer: Self-pay | Admitting: Internal Medicine

## 2022-11-07 MED ORDER — MONTELUKAST SODIUM 10 MG PO TABS: 10.0000 mg | ORAL_TABLET | Freq: Every day | ORAL | 3 refills | Status: DC

## 2022-11-07 MED ORDER — PAROXETINE HCL 20 MG PO TABS: 20.0000 mg | ORAL_TABLET | Freq: Every day | ORAL | 1 refills | Status: DC

## 2022-11-10 ENCOUNTER — Encounter: Payer: Self-pay | Admitting: Internal Medicine

## 2022-11-10 ENCOUNTER — Ambulatory Visit (INDEPENDENT_AMBULATORY_CARE_PROVIDER_SITE_OTHER): Payer: 59 | Admitting: Internal Medicine

## 2022-11-10 VITALS — BP 122/78 | HR 81 | Temp 98.1°F | Ht 74.0 in | Wt 185.2 lb

## 2022-11-10 DIAGNOSIS — J320 Chronic maxillary sinusitis: Secondary | ICD-10-CM | POA: Diagnosis not present

## 2022-11-10 DIAGNOSIS — J0100 Acute maxillary sinusitis, unspecified: Secondary | ICD-10-CM | POA: Insufficient documentation

## 2022-11-10 MED ORDER — PREDNISONE 10 MG PO TABS: ORAL_TABLET | ORAL | 0 refills | Status: DC

## 2022-11-10 MED ORDER — BENZONATATE 200 MG PO CAPS: 200.0000 mg | ORAL_CAPSULE | Freq: Three times a day (TID) | ORAL | 1 refills | Status: DC | PRN

## 2022-11-10 MED ORDER — LEVOFLOXACIN 500 MG PO TABS: 500.0000 mg | ORAL_TABLET | Freq: Every day | ORAL | 0 refills | Status: AC

## 2022-11-10 NOTE — Assessment & Plan Note (Signed)
Given chronicity of symptoms,   Will treat with empiric antibiotics, steroids  and  saline lavage.  Adding steroid nasal spray if not already taking.

## 2022-11-10 NOTE — Progress Notes (Signed)
Subjective:  Patient ID: TERIK DACANAY, male    DOB: 28-Dec-1990  Age: 31 y.o. MRN: [id number]  CC: The primary encounter diagnosis was Chronic maxillary sinusitis. A diagnosis of Acute non-recurrent maxillary sinusitis was also pertinent to this visit.   HPI Christian Montgomery presents for  Chief Complaint  Patient presents with   Acute Visit    Cough x 4 weeks    31 yr old male with history of OSA, allergic rhinitis managed with astelin and singulair, GERD managed with protonix presents with 4 week history of dry cough and sinus congestion   Started with  a sore throat,  runny nose mild body aches,  no fevers. No previous testing,  the cough is mostly nonproductive  but occasionally  produces   scant amounts of yellow/green  mucus .  Cough is aggravated by cold weather, by talking and by lying supine .  Has had persistent sinus pressure and congestion without pain ,  ears feel ok  Using OTC cough and cold medication with no significant change in symptoms weight stable     Outpatient Medications Prior to Visit  Medication Sig Dispense Refill   ALPRAZolam (XANAX) 0.25 MG tablet Take 1 tablet (0.25 mg total) by mouth at bedtime as needed for anxiety. 30 tablet 1   azelastine (ASTELIN) 0.1 % nasal spray Place into both nostrils 2 (two) times daily. Use in each nostril as directed     dicyclomine (BENTYL) 20 MG tablet TAKE 1 TABLET (20 MG TOTAL) BY MOUTH 2 (TWO) TIMES DAILY. BEFORE LUNCH AND DINNER 180 tablet 0   hyoscyamine (LEVSIN SL) 0.125 MG SL tablet Place under the tongue.     montelukast (SINGULAIR) 10 MG tablet Take 1 tablet (10 mg total) by mouth at bedtime. 90 tablet 3   Multiple Vitamins-Minerals (MULTIVITAMIN ADULTS PO) Take by mouth.     pantoprazole (PROTONIX) 40 MG tablet Take 1 tablet (40 mg total) by mouth daily. 90 tablet 2   PARoxetine (PAXIL) 20 MG tablet Take 1 tablet (20 mg total) by mouth daily with supper. 90 tablet 1   Probiotic Product (PROBIOTIC DAILY PO) Take by  mouth.     propranolol (INDERAL) 10 MG tablet TAKE 1 TABLET (10 MG TOTAL) BY MOUTH 3 (THREE) TIMES DAILY. AS NEEDED FOR RAPID HEART RATE 270 tablet 1   Simethicone (GAS-X PO) Take by mouth.     triamcinolone cream (KENALOG) 0.1 % Apply 1 application. topically 2 (two) times daily. 80 g 0   predniSONE (DELTASONE) 10 MG tablet 6 tablets on Day 1 , then reduce by 1 tablet daily until gone (Patient not taking: Reported on 11/10/2022) 21 tablet 0   No facility-administered medications prior to visit.    Review of Systems;  Patient denies headache, fevers, malaise, unintentional weight loss, skin rash, eye pain, sinus congestion and sinus pain, sore throat, dysphagia,  hemoptysis , cough, dyspnea, wheezing, chest pain, palpitations, orthopnea, edema, abdominal pain, nausea, melena, diarrhea, constipation, flank pain, dysuria, hematuria, urinary  Frequency, nocturia, numbness, tingling, seizures,  Focal weakness, Loss of consciousness,  Tremor, insomnia, depression, anxiety, and suicidal ideation.      Objective:  BP 122/78   Pulse 81   Temp 98.1 F (36.7 C) (Oral)   Ht 6\' 2"  (1.88 m)   Wt 185 lb 3.2 oz (84 kg)   SpO2 97%   BMI 23.78 kg/m   BP Readings from Last 3 Encounters:  11/10/22 122/78  10/22/22 121/78  07/09/22 126/82  Wt Readings from Last 3 Encounters:  11/10/22 185 lb 3.2 oz (84 kg)  10/22/22 180 lb (81.6 kg)  07/09/22 181 lb 12.8 oz (82.5 kg)    General appearance: alert, cooperative and appears stated age Ears: normal TM's and external ear canals both ears Throat: lips, mucosa, and tongue normal; teeth and gums normal Neck: no adenopathy, no carotid bruit, supple, symmetrical, trachea midline and thyroid not enlarged, symmetric, no tenderness/mass/nodules Back: symmetric, no curvature. ROM normal. No CVA tenderness. Lungs: clear to auscultation bilaterally Heart: regular rate and rhythm, S1, S2 normal, no murmur, click, rub or gallop Abdomen: soft, non-tender;  bowel sounds normal; no masses,  no organomegaly Pulses: 2+ and symmetric Skin: Skin color, texture, turgor normal. No rashes or lesions Lymph nodes: Cervical, supraclavicular, and axillary nodes normal.    Lab Results  Component Value Date   HGBA1C 5.5 01/06/2022   HGBA1C 5.4 05/27/2013   HGBA1C 5.4 09/21/2012    Lab Results  Component Value Date   CREATININE 0.90 01/06/2022   CREATININE 0.96 01/07/2021   CREATININE 1.13 01/23/2020    Lab Results  Component Value Date   WBC 4.0 05/22/2022   HGB 14.9 05/22/2022   HCT 45.1 05/22/2022   PLT 172.0 05/22/2022   GLUCOSE 97 01/06/2022   CHOL 139 01/06/2022   TRIG 55.0 01/06/2022   HDL 43.30 01/06/2022   LDLCALC 84 01/06/2022   ALT 16 01/07/2021   AST 15 01/07/2021   NA 140 01/06/2022   K 4.4 01/06/2022   CL 101 01/06/2022   CREATININE 0.90 01/06/2022   BUN 11 01/06/2022   CO2 31 01/06/2022   TSH 1.17 05/22/2022   INR 1.3 (H) 02/24/2014   HGBA1C 5.5 01/06/2022    No results found.  Assessment & Plan:   Problem List Items Addressed This Visit     Sinusitis, acute maxillary    Given chronicity of symptoms,   Will treat with empiric antibiotics, steroids  and  saline lavage.  Adding steroid nasal spray if not already taking.        Relevant Medications   levofloxacin (LEVAQUIN) 500 MG tablet   benzonatate (TESSALON) 200 MG capsule   predniSONE (DELTASONE) 10 MG tablet   Other Visit Diagnoses     Chronic maxillary sinusitis    -  Primary   Relevant Medications   levofloxacin (LEVAQUIN) 500 MG tablet   benzonatate (TESSALON) 200 MG capsule   predniSONE (DELTASONE) 10 MG tablet       Follow-up: No follow-ups on file.   Sherlene Shams, MD

## 2022-11-10 NOTE — Patient Instructions (Signed)
I am treating you for sinusitis which is a complication from your viral infection due to  persistent sinus congestion.   I am prescribing an antibiotic (levaquin) and a prednisone taper  To manage the infection and the inflammation in your ear/sinuses.  I am prescribing  benzonatate capsules   FOR THE COUGH.  Gargle with salt water as needed for sore throat.

## 2022-11-12 NOTE — Telephone Encounter (Signed)
MyChart messgae sent to patient. 

## 2022-11-22 DIAGNOSIS — G4733 Obstructive sleep apnea (adult) (pediatric): Secondary | ICD-10-CM | POA: Diagnosis not present

## 2022-12-21 DIAGNOSIS — G4733 Obstructive sleep apnea (adult) (pediatric): Secondary | ICD-10-CM | POA: Diagnosis not present

## 2022-12-31 ENCOUNTER — Encounter: Payer: Self-pay | Admitting: Internal Medicine

## 2023-01-01 ENCOUNTER — Other Ambulatory Visit: Payer: Self-pay

## 2023-01-01 DIAGNOSIS — Z13220 Encounter for screening for lipoid disorders: Secondary | ICD-10-CM

## 2023-01-01 DIAGNOSIS — R7301 Impaired fasting glucose: Secondary | ICD-10-CM

## 2023-01-01 DIAGNOSIS — E538 Deficiency of other specified B group vitamins: Secondary | ICD-10-CM

## 2023-01-01 NOTE — Telephone Encounter (Signed)
LM FOR PT TO CB TO SCHED LABS

## 2023-01-01 NOTE — Telephone Encounter (Signed)
Orders placed.

## 2023-01-01 NOTE — Telephone Encounter (Signed)
Pt called back. He schedule for 2/5 for labs.

## 2023-01-05 ENCOUNTER — Other Ambulatory Visit (INDEPENDENT_AMBULATORY_CARE_PROVIDER_SITE_OTHER): Payer: 59

## 2023-01-05 DIAGNOSIS — R7301 Impaired fasting glucose: Secondary | ICD-10-CM | POA: Diagnosis not present

## 2023-01-05 DIAGNOSIS — E538 Deficiency of other specified B group vitamins: Secondary | ICD-10-CM

## 2023-01-05 DIAGNOSIS — Z13220 Encounter for screening for lipoid disorders: Secondary | ICD-10-CM | POA: Diagnosis not present

## 2023-01-05 LAB — COMPREHENSIVE METABOLIC PANEL
ALT: 14 U/L (ref 0–53)
AST: 13 U/L (ref 0–37)
Albumin: 4.6 g/dL (ref 3.5–5.2)
Alkaline Phosphatase: 49 U/L (ref 39–117)
BUN: 11 mg/dL (ref 6–23)
CO2: 29 mEq/L (ref 19–32)
Calcium: 9 mg/dL (ref 8.4–10.5)
Chloride: 102 mEq/L (ref 96–112)
Creatinine, Ser: 1.04 mg/dL (ref 0.40–1.50)
GFR: 93.97 mL/min (ref >60.00)
Glucose, Bld: 92 mg/dL (ref 70–99)
Potassium: 4.2 mEq/L (ref 3.5–5.1)
Sodium: 140 mEq/L (ref 135–145)
Total Bilirubin: 1.6 mg/dL — ABNORMAL HIGH (ref 0.2–1.2)
Total Protein: 6.6 g/dL (ref 6.0–8.3)

## 2023-01-05 LAB — VITAMIN B12: Vitamin B-12: >1500 pg/mL — ABNORMAL HIGH (ref 211–911)

## 2023-01-05 LAB — CBC WITH DIFFERENTIAL/PLATELET
Basophils Absolute: 0 10*3/uL (ref 0.0–0.1)
Basophils Relative: 0.6 % (ref 0.0–3.0)
Eosinophils Absolute: 0.1 10*3/uL (ref 0.0–0.7)
Eosinophils Relative: 2.4 % (ref 0.0–5.0)
HCT: 43.5 % (ref 39.0–52.0)
Hemoglobin: 14.7 g/dL (ref 13.0–17.0)
Lymphocytes Relative: 24.9 % (ref 12.0–46.0)
Lymphs Abs: 1 10*3/uL (ref 0.7–4.0)
MCHC: 33.8 g/dL (ref 30.0–36.0)
MCV: 90 fl (ref 78.0–100.0)
Monocytes Absolute: 0.3 10*3/uL (ref 0.1–1.0)
Monocytes Relative: 8.5 % (ref 3.0–12.0)
Neutro Abs: 2.5 10*3/uL (ref 1.4–7.7)
Neutrophils Relative %: 63.6 % (ref 43.0–77.0)
Platelets: 190 10*3/uL (ref 150.0–400.0)
RBC: 4.84 Mil/uL (ref 4.22–5.81)
RDW: 12.9 % (ref 11.5–15.5)
WBC: 4 10*3/uL (ref 4.0–10.5)

## 2023-01-05 LAB — LIPID PANEL
Cholesterol: 143 mg/dL (ref 0–200)
HDL: 43.2 mg/dL (ref >39.00)
LDL Cholesterol: 84 mg/dL (ref 0–99)
NonHDL: 99.9
Total CHOL/HDL Ratio: 3
Triglycerides: 82 mg/dL (ref 0.0–149.0)
VLDL: 16.4 mg/dL (ref 0.0–40.0)

## 2023-01-05 LAB — HEMOGLOBIN A1C: Hgb A1c MFr Bld: 5.5 % (ref 4.6–6.5)

## 2023-01-09 ENCOUNTER — Ambulatory Visit (INDEPENDENT_AMBULATORY_CARE_PROVIDER_SITE_OTHER): Payer: 59 | Admitting: Internal Medicine

## 2023-01-09 ENCOUNTER — Encounter: Payer: Self-pay | Admitting: Internal Medicine

## 2023-01-09 VITALS — BP 110/64 | HR 82 | Temp 97.7°F | Ht 74.0 in | Wt 186.8 lb

## 2023-01-09 DIAGNOSIS — K582 Mixed irritable bowel syndrome: Secondary | ICD-10-CM | POA: Diagnosis not present

## 2023-01-09 DIAGNOSIS — Z8711 Personal history of peptic ulcer disease: Secondary | ICD-10-CM

## 2023-01-09 DIAGNOSIS — R17 Unspecified jaundice: Secondary | ICD-10-CM

## 2023-01-09 DIAGNOSIS — K760 Fatty (change of) liver, not elsewhere classified: Secondary | ICD-10-CM

## 2023-01-09 DIAGNOSIS — F430 Acute stress reaction: Secondary | ICD-10-CM

## 2023-01-09 DIAGNOSIS — Z0000 Encounter for general adult medical examination without abnormal findings: Secondary | ICD-10-CM

## 2023-01-09 DIAGNOSIS — E538 Deficiency of other specified B group vitamins: Secondary | ICD-10-CM

## 2023-01-09 DIAGNOSIS — F411 Generalized anxiety disorder: Secondary | ICD-10-CM | POA: Diagnosis not present

## 2023-01-09 DIAGNOSIS — R69 Illness, unspecified: Secondary | ICD-10-CM | POA: Diagnosis not present

## 2023-01-09 NOTE — Assessment & Plan Note (Signed)
LFTs normal      Lab Results  Component Value Date   ALT 14 01/05/2023   AST 13 01/05/2023   ALKPHOS 49 01/05/2023   BILITOT 1.6 (H) 01/05/2023

## 2023-01-09 NOTE — Assessment & Plan Note (Signed)
Improved with birth of daughter , [facility] , born 3 weeks premature on Jan 22 )3 weeks premature)   but doing well

## 2023-01-09 NOTE — Assessment & Plan Note (Signed)
Continue pantoprazole40 mg daily given new onset anorexia x 3 days

## 2023-01-09 NOTE — Assessment & Plan Note (Signed)
His dizziness has resolved with  B12 supplementation

## 2023-01-09 NOTE — Assessment & Plan Note (Addendum)
IBD ruled out with biopsies  done during March 2023 colonoscopy

## 2023-01-09 NOTE — Patient Instructions (Signed)
Congratulations on the birth of your daughter!  May God bless [facility]!  Abdominal ultrasound for 10 yr follow up on fatty liver.  Labs look great!  No signs of diabetes

## 2023-01-09 NOTE — Assessment & Plan Note (Signed)

## 2023-01-09 NOTE — Progress Notes (Signed)
Patient ID: Christian Montgomery, male    DOB: 1991-03-23  Age: 32 y.o. MRN: [id number]  The patient is here for annual preventive examination and management of other chronic and acute problems.   The risk factors are reflected in the social history.   The roster of all physicians providing medical care to patient - is listed in the Snapshot section of the chart.   Activities of daily living:  The patient is 100% independent in all ADLs: dressing, toileting, feeding as well as independent mobility   Home safety : The patient has smoke detectors in the home. They wear seatbelts.  There are no unsecured firearms at home. There is no violence in the home.    There is no risks for hepatitis, STDs or HIV. There is no   history of blood transfusion. They have no travel history to infectious disease endemic areas of the world.   The patient has seen their dentist in the last six month. They have seen their eye doctor in the last year. The patinet  denies slight hearing difficulty with regard to whispered voices and some television programs.  They have deferred audiologic testing in the last year.  They do not  have excessive sun exposure. Discussed the need for sun protection: hats, long sleeves and use of sunscreen if there is significant sun exposure.    Diet: the importance of a healthy diet is discussed. They do have a healthy diet.   The benefits of regular aerobic exercise were discussed. The patient  exercises  3 to 5 days per week  for  60 minutes.    Depression screen: there are no signs or vegative symptoms of depression- irritability, change in appetite, anhedonia, sadness/tearfullness.   The following portions of the patient's history were reviewed and updated as appropriate: allergies, current medications, past family history, past medical history,  past surgical history, past social history  and problem list.   Visual acuity was not assessed per patient preference since the patient has regular  follow up with an  ophthalmologist. Hearing and body mass index were assessed and reviewed.    During the course of the visit the patient was educated and counseled about appropriate screening and preventive services including : fall prevention , diabetes screening, nutrition counseling, colorectal cancer screening, and recommended immunizations.    Chief Complaint:  No issues.  Proud father of [facility] born Jan 22 3 weeks prematurely  doing well   Review of Symptoms  Patient denies headache, fevers, malaise, unintentional weight loss, skin rash, eye pain, sinus congestion and sinus pain, sore throat, dysphagia,  hemoptysis , cough, dyspnea, wheezing, chest pain, palpitations, orthopnea, edema, abdominal pain, nausea, melena, diarrhea, constipation, flank pain, dysuria, hematuria, urinary  Frequency, nocturia, numbness, tingling, seizures,  Focal weakness, Loss of consciousness,  Tremor, insomnia, depression, anxiety, and suicidal ideation.    Physical Exam:  BP 110/64   Pulse 82   Temp 97.7 F (36.5 C) (Oral)   Ht 6' 2"$  (1.88 m)   Wt 186 lb 12.8 oz (84.7 kg)   SpO2 96%   BMI 23.98 kg/m    Physical Exam Vitals reviewed.  Constitutional:      General: He is not in acute distress.    Appearance: Normal appearance. He is normal weight. He is not ill-appearing, toxic-appearing or diaphoretic.  HENT:     Head: Normocephalic and atraumatic.     Right Ear: Tympanic membrane, ear canal and external ear normal. There is no impacted  cerumen.     Left Ear: Tympanic membrane, ear canal and external ear normal. There is no impacted cerumen.     Nose: Nose normal.     Mouth/Throat:     Mouth: Mucous membranes are moist.     Pharynx: Oropharynx is clear.  Eyes:     General: No scleral icterus.       Right eye: No discharge.        Left eye: No discharge.     Conjunctiva/sclera: Conjunctivae normal.  Neck:     Thyroid: No thyromegaly.     Vascular: No carotid bruit or JVD.   Cardiovascular:     Rate and Rhythm: Normal rate and regular rhythm.     Heart sounds: Normal heart sounds.  Pulmonary:     Effort: Pulmonary effort is normal. No respiratory distress.     Breath sounds: Normal breath sounds.  Abdominal:     General: Bowel sounds are normal.     Palpations: Abdomen is soft. There is no mass.     Tenderness: There is no abdominal tenderness. There is no guarding or rebound.  Musculoskeletal:        General: Normal range of motion.     Cervical back: Normal range of motion and neck supple.  Lymphadenopathy:     Cervical: No cervical adenopathy.  Skin:    General: Skin is warm and dry.  Neurological:     General: No focal deficit present.     Mental Status: He is alert and oriented to person, place, and time. Mental status is at baseline.  Psychiatric:        Mood and Affect: Mood normal.        Behavior: Behavior normal.        Thought Content: Thought content normal.        Judgment: Judgment normal.     Assessment and Plan: Nonalcoholic fatty liver disease without nonalcoholic steatohepatitis (NASH) Assessment & Plan: LFTs normal      Lab Results  Component Value Date   ALT 14 01/05/2023   AST 13 01/05/2023   ALKPHOS 49 01/05/2023   BILITOT 1.6 (H) 01/05/2023     Orders: -     US ABDOMEN LIMITED RUQ (LIVER/GB); Future  Irritable bowel syndrome with both constipation and diarrhea Assessment & Plan: IBD ruled out with biopsies  done during March 2023 colonoscopy   Anxiety as acute reaction to exceptional stress Assessment & Plan: Improved with birth of daughter , [facility] , born 3 weeks premature on Jan 22 )3 weeks premature)   but doing well    Elevated bilirubin -     US ABDOMEN LIMITED RUQ (LIVER/GB); Future  Personal history of gastric ulcer Assessment & Plan: Continue pantoprazole40 mg daily given new onset anorexia x 3 days   B12 deficiency Assessment & Plan: His dizziness has resolved with  B12  supplementation    Encounter for preventive health examination Assessment & Plan: age appropriate education and counseling updated, referrals for preventative services and immunizations addressed, dietary and smoking counseling addressed, most recent labs reviewed.  I have personally reviewed and have noted:   1) the patient's medical and social history 2) The pt's use of alcohol, tobacco, and illicit drugs 3) The patient's current medications and supplements 4) Functional ability including ADL's, fall risk, home safety risk, hearing and visual impairment 5) Diet and physical activities 6) Evidence for depression or mood disorder 7) The patient's height, weight, and BMI have been recorded  in the chart   I have made referrals, and provided counseling and education based on review of the above      No follow-ups on file.  Crecencio Mc, MD

## 2023-01-12 ENCOUNTER — Encounter: Payer: Self-pay | Admitting: Internal Medicine

## 2023-01-12 DIAGNOSIS — R300 Dysuria: Secondary | ICD-10-CM

## 2023-01-14 ENCOUNTER — Other Ambulatory Visit (INDEPENDENT_AMBULATORY_CARE_PROVIDER_SITE_OTHER): Payer: 59

## 2023-01-14 DIAGNOSIS — R300 Dysuria: Secondary | ICD-10-CM | POA: Diagnosis not present

## 2023-01-14 LAB — URINALYSIS, ROUTINE W REFLEX MICROSCOPIC
Bilirubin Urine: NEGATIVE
Hgb urine dipstick: NEGATIVE
Ketones, ur: NEGATIVE
Leukocytes,Ua: NEGATIVE
Nitrite: NEGATIVE
Specific Gravity, Urine: >=1.03 — AB (ref 1.000–1.030)
Urine Glucose: NEGATIVE
Urobilinogen, UA: 0.2 (ref 0.0–1.0)
pH: 5.5 (ref 5.0–8.0)

## 2023-01-15 ENCOUNTER — Other Ambulatory Visit: Payer: Self-pay | Admitting: Internal Medicine

## 2023-01-15 ENCOUNTER — Ambulatory Visit
Admission: RE | Admit: 2023-01-15 | Discharge: 2023-01-15 | Disposition: A | Discharge status: Home / Self Care | Payer: 59 | Source: Ambulatory Visit | Attending: Internal Medicine | Admitting: Internal Medicine

## 2023-01-15 ENCOUNTER — Encounter: Payer: Self-pay | Admitting: Internal Medicine

## 2023-01-15 DIAGNOSIS — R17 Unspecified jaundice: Secondary | ICD-10-CM | POA: Insufficient documentation

## 2023-01-15 DIAGNOSIS — K760 Fatty (change of) liver, not elsewhere classified: Secondary | ICD-10-CM | POA: Insufficient documentation

## 2023-01-15 DIAGNOSIS — R300 Dysuria: Secondary | ICD-10-CM | POA: Insufficient documentation

## 2023-01-15 LAB — URINE CULTURE
MICRO NUMBER:: 14564536
Result:: NO GROWTH
SPECIMEN QUALITY:: ADEQUATE

## 2023-01-15 MED ORDER — CIPROFLOXACIN HCL 500 MG PO TABS: 500.0000 mg | ORAL_TABLET | Freq: Two times a day (BID) | ORAL | 0 refills | Status: DC

## 2023-01-16 ENCOUNTER — Ambulatory Visit: Payer: 59 | Admitting: Internal Medicine

## 2023-01-16 ENCOUNTER — Encounter: Payer: Self-pay | Admitting: Internal Medicine

## 2023-01-16 ENCOUNTER — Ambulatory Visit (INDEPENDENT_AMBULATORY_CARE_PROVIDER_SITE_OTHER): Payer: 59 | Admitting: Internal Medicine

## 2023-01-16 VITALS — BP 118/76 | HR 88 | Temp 97.8°F | Ht 74.0 in | Wt 183.8 lb

## 2023-01-16 DIAGNOSIS — K760 Fatty (change of) liver, not elsewhere classified: Secondary | ICD-10-CM

## 2023-01-16 DIAGNOSIS — N410 Acute prostatitis: Secondary | ICD-10-CM | POA: Diagnosis not present

## 2023-01-16 DIAGNOSIS — R110 Nausea: Secondary | ICD-10-CM

## 2023-01-16 DIAGNOSIS — N342 Other urethritis: Secondary | ICD-10-CM | POA: Diagnosis not present

## 2023-01-16 MED ORDER — ONDANSETRON 4 MG PO TBDP: 4.0000 mg | ORAL_TABLET | Freq: Three times a day (TID) | ORAL | 0 refills | Status: DC | PRN

## 2023-01-16 NOTE — Progress Notes (Signed)
Subjective:  Patient ID: Christian Montgomery, male    DOB: 04-17-1991  Age: 32 y.o. MRN: [id number]  CC: The primary encounter diagnosis was Prostatitis, acute. Diagnoses of Nausea and Nonalcoholic fatty liver disease without nonalcoholic steatohepatitis (NASH) were also pertinent to this visit.   HPI Christian Montgomery presents for  Chief Complaint  Patient presents with   Dysuria    Follow up on burning with urination   Symptoms started 6 days ago as burning with urination . On Tuesday developed body aches without fever, and lower back pain, more  so on the right .  Wednesday developed anorexia,  mild nausea and fatigue .  Diarrhea started today   3 runny stools,  which started after he took his first  dose of cipro this morning .  Denies penile discharge thinks he may have had some leakage.    Has not had sexual intercourse wsince wife delivered her baby    Outpatient Medications Prior to Visit  Medication Sig Dispense Refill   ALPRAZolam (XANAX) 0.25 MG tablet Take 1 tablet (0.25 mg total) by mouth at bedtime as needed for anxiety. 30 tablet 1   azelastine (ASTELIN) 0.1 % nasal spray Place into both nostrils 2 (two) times daily. Use in each nostril as directed     ciprofloxacin (CIPRO) 500 MG tablet Take 1 tablet (500 mg total) by mouth 2 (two) times daily. 28 tablet 0   dicyclomine (BENTYL) 20 MG tablet TAKE 1 TABLET (20 MG TOTAL) BY MOUTH 2 (TWO) TIMES DAILY. BEFORE LUNCH AND DINNER 180 tablet 0   hyoscyamine (LEVSIN SL) 0.125 MG SL tablet Place under the tongue.     montelukast (SINGULAIR) 10 MG tablet Take 1 tablet (10 mg total) by mouth at bedtime. 90 tablet 3   Multiple Vitamins-Minerals (MULTIVITAMIN ADULTS PO) Take by mouth.     pantoprazole (PROTONIX) 40 MG tablet TAKE 1 TABLET BY MOUTH EVERY DAY 90 tablet 2   PARoxetine (PAXIL) 20 MG tablet Take 1 tablet (20 mg total) by mouth daily with supper. 90 tablet 1   Probiotic Product (PROBIOTIC DAILY PO) Take by mouth.     propranolol  (INDERAL) 10 MG tablet TAKE 1 TABLET (10 MG TOTAL) BY MOUTH 3 (THREE) TIMES DAILY. AS NEEDED FOR RAPID HEART RATE 270 tablet 1   Simethicone (GAS-X PO) Take by mouth.     triamcinolone cream (KENALOG) 0.1 % Apply 1 application. topically 2 (two) times daily. 80 g 0   No facility-administered medications prior to visit.    Review of Systems;  Patient denies headache, fevers, malaise, unintentional weight loss, skin rash, eye pain, sinus congestion and sinus pain, sore throat, dysphagia,  hemoptysis , cough, dyspnea, wheezing, chest pain, palpitations, orthopnea, edema, abdominal pain, nausea, melena, diarrhea, constipation, flank pain, dysuria, hematuria, urinary  Frequency, nocturia, numbness, tingling, seizures,  Focal weakness, Loss of consciousness,  Tremor, insomnia, depression, anxiety, and suicidal ideation.      Objective:  BP 118/76   Pulse 88   Temp 97.8 F (36.6 C) (Oral)   Ht 6\' 2"  (1.88 m)   Wt 183 lb 12.8 oz (83.4 kg)   SpO2 99%   BMI 23.60 kg/m   BP Readings from Last 3 Encounters:  01/16/23 118/76  01/09/23 110/64  11/10/22 122/78    Wt Readings from Last 3 Encounters:  01/16/23 183 lb 12.8 oz (83.4 kg)  01/09/23 186 lb 12.8 oz (84.7 kg)  11/10/22 185 lb 3.2 oz (84 kg)  Physical Exam Vitals reviewed.  Constitutional:      General: He is not in acute distress.    Appearance: Normal appearance. He is normal weight. He is not ill-appearing, toxic-appearing or diaphoretic.  HENT:     Head: Normocephalic.  Eyes:     General: No scleral icterus.       Right eye: No discharge.        Left eye: No discharge.     Conjunctiva/sclera: Conjunctivae normal.  Cardiovascular:     Rate and Rhythm: Normal rate and regular rhythm.     Heart sounds: Normal heart sounds.  Pulmonary:     Effort: Pulmonary effort is normal. No respiratory distress.     Breath sounds: Normal breath sounds.  Musculoskeletal:        General: Normal range of motion.     Cervical back:  Normal range of motion.  Skin:    General: Skin is warm and dry.  Neurological:     General: No focal deficit present.     Mental Status: He is alert and oriented to person, place, and time. Mental status is at baseline.  Psychiatric:        Mood and Affect: Mood normal.        Behavior: Behavior normal.        Thought Content: Thought content normal.        Judgment: Judgment normal.     Lab Results  Component Value Date   HGBA1C 5.5 01/05/2023   HGBA1C 5.5 01/06/2022   HGBA1C 5.4 05/27/2013    Lab Results  Component Value Date   CREATININE 1.04 01/16/2023   CREATININE 1.04 01/05/2023   CREATININE 0.90 01/06/2022    Lab Results  Component Value Date   WBC 4.5 01/16/2023   HGB 14.9 01/16/2023   HCT 43.1 01/16/2023   PLT 211 01/16/2023   GLUCOSE 84 01/16/2023   CHOL 143 01/05/2023   TRIG 82.0 01/05/2023   HDL 43.20 01/05/2023   LDLCALC 84 01/05/2023   ALT 14 01/16/2023   AST 15 01/16/2023   NA 139 01/16/2023   K 3.9 01/16/2023   CL 101 01/16/2023   CREATININE 1.04 01/16/2023   BUN 16 01/16/2023   CO2 25 01/16/2023   TSH 1.17 05/22/2022   PSA 1.41 01/16/2023   INR 1.3 (H) 02/24/2014   HGBA1C 5.5 01/05/2023    US Abdomen Limited RUQ (LIVER/GB)  Result Date: 01/15/2023 CLINICAL DATA:  Elevated bilirubin. EXAM: ULTRASOUND ABDOMEN LIMITED RIGHT UPPER QUADRANT COMPARISON:  September 15, 2013 FINDINGS: Gallbladder: No gallstones or wall thickening visualized. No sonographic Murphy sign noted by sonographer. Common bile duct: Diameter: 2.9 mm Liver: No focal lesion identified. Within normal limits in parenchymal echogenicity. Portal vein is patent on color Doppler imaging with normal direction of blood flow towards the liver. Other: None. IMPRESSION: Normal right upper quadrant ultrasound. Electronically Signed   By: Sherian Rein M.D.   On: 01/15/2023 09:43    Assessment & Plan:  .Prostatitis, acute Assessment & Plan: Continue empiric treatment  checking urine for  GC/chlamydia   Orders: -     PSA -     Chlamydia/GC NAA, Confirmation -     CBC with Differential/Platelet  Nausea -     Comprehensive metabolic panel  Nonalcoholic fatty liver disease without nonalcoholic steatohepatitis (NASH) Assessment & Plan: Repeat ultrasound was done recently and normal   Other orders -     Ondansetron; Take 1 tablet (4 mg total) by mouth every 8 (  eight) hours as needed for nausea or vomiting.  Dispense: 20 tablet; Refill: 0     I provided  20 minutes of face-to-face time during this encounter reviewing patient's last visit with me, patient's  most recent visit with cardiology,  nephrology,  and neurology,  recent surgical and non surgical procedures, previous  labs and imaging studies, counseling on currently addressed issues,  and post visit ordering to diagnostics and therapeutics .   Follow-up: No follow-ups on file.   Sherlene Shams, MD

## 2023-01-16 NOTE — Patient Instructions (Signed)
I'm treating you for a urinary tract infection /prostatitis  Stay on the cipro unless you hear from me to stop it.  2 times daily for 14 days   Zofran as needed for nausea   START THE PROBIOTIC ASAP!

## 2023-01-17 LAB — CBC WITH DIFFERENTIAL/PLATELET
Absolute Monocytes: 378 cells/uL (ref 200–950)
Basophils Absolute: 50 cells/uL (ref 0–200)
Basophils Relative: 1.1 %
Eosinophils Absolute: 81 cells/uL (ref 15–500)
Eosinophils Relative: 1.8 %
HCT: 43.1 % (ref 38.5–50.0)
Hemoglobin: 14.9 g/dL (ref 13.2–17.1)
Lymphs Abs: 1139 cells/uL (ref 850–3900)
MCH: 30.2 pg (ref 27.0–33.0)
MCHC: 34.6 g/dL (ref 32.0–36.0)
MCV: 87.4 fL (ref 80.0–100.0)
MPV: 10.9 fL (ref 7.5–12.5)
Monocytes Relative: 8.4 %
Neutro Abs: 2853 cells/uL (ref 1500–7800)
Neutrophils Relative %: 63.4 %
Platelets: 211 10*3/uL (ref 140–400)
RBC: 4.93 10*6/uL (ref 4.20–5.80)
RDW: 12.1 % (ref 11.0–15.0)
Total Lymphocyte: 25.3 %
WBC: 4.5 10*3/uL (ref 3.8–10.8)

## 2023-01-17 LAB — COMPREHENSIVE METABOLIC PANEL
AG Ratio: 1.7 (calc) (ref 1.0–2.5)
ALT: 14 U/L (ref 9–46)
AST: 15 U/L (ref 10–40)
Albumin: 4.4 g/dL (ref 3.6–5.1)
Alkaline phosphatase (APISO): 48 U/L (ref 36–130)
BUN: 16 mg/dL (ref 7–25)
CO2: 25 mmol/L (ref 20–32)
Calcium: 9.3 mg/dL (ref 8.6–10.3)
Chloride: 101 mmol/L (ref 98–110)
Creat: 1.04 mg/dL (ref 0.60–1.26)
Globulin: 2.6 g/dL (calc) (ref 1.9–3.7)
Glucose, Bld: 84 mg/dL (ref 65–99)
Potassium: 3.9 mmol/L (ref 3.5–5.3)
Sodium: 139 mmol/L (ref 135–146)
Total Bilirubin: 1.6 mg/dL — ABNORMAL HIGH (ref 0.2–1.2)
Total Protein: 7 g/dL (ref 6.1–8.1)

## 2023-01-17 LAB — PSA: PSA: 1.41 ng/mL (ref <=4.00)

## 2023-01-18 DIAGNOSIS — N410 Acute prostatitis: Secondary | ICD-10-CM | POA: Insufficient documentation

## 2023-01-18 NOTE — Assessment & Plan Note (Signed)
Continue empiric treatment  checking urine for GC/chlamydia

## 2023-01-18 NOTE — Assessment & Plan Note (Signed)
Repeat ultrasound was done recently and normal

## 2023-01-21 DIAGNOSIS — G4733 Obstructive sleep apnea (adult) (pediatric): Secondary | ICD-10-CM | POA: Diagnosis not present

## 2023-01-21 LAB — CHLAMYDIA/GC NAA, CONFIRMATION
Chlamydia trachomatis, NAA: NEGATIVE
Neisseria gonorrhoeae, NAA: NEGATIVE

## 2023-02-19 DIAGNOSIS — G4733 Obstructive sleep apnea (adult) (pediatric): Secondary | ICD-10-CM | POA: Diagnosis not present

## 2023-03-21 DIAGNOSIS — G4733 Obstructive sleep apnea (adult) (pediatric): Secondary | ICD-10-CM | POA: Diagnosis not present

## 2023-04-06 ENCOUNTER — Encounter: Payer: Self-pay | Admitting: Internal Medicine

## 2023-04-06 NOTE — Telephone Encounter (Signed)
Spoke with pt and since last Wednesday he has been having a feeling of his heart fluttering or skipping a beat a couple of times during the during. Pt stated that he has had SOBr with it once or twice, no chest pain. Pt has been scheduled to see Bethanie Dicker tomorrow at 2:40 pm. Pt was advised that if he develops chest pain or symptoms become more frequent that he would need to be evaluated immediately. Pt gave a verbal understanding.

## 2023-04-07 ENCOUNTER — Ambulatory Visit (INDEPENDENT_AMBULATORY_CARE_PROVIDER_SITE_OTHER): Payer: 59 | Admitting: Nurse Practitioner

## 2023-04-07 VITALS — BP 128/83 | HR 73 | Temp 98.5°F | Ht 74.0 in | Wt 187.2 lb

## 2023-04-07 DIAGNOSIS — R002 Palpitations: Secondary | ICD-10-CM

## 2023-04-07 NOTE — Assessment & Plan Note (Signed)
EKG reassuring, no acute changes noted. EKG compared to prior. Will refer to Cardiology for cardiac monitor and further evaluation. Advised patient to decrease caffeine intake. He denies any new stressors or anxiety. Strict precautions given to patient, if worsening, chest pain, shortness of breath or syncope to seek immediate care.

## 2023-04-07 NOTE — Progress Notes (Signed)
Bethanie Dicker, NP-C Phone: [phone]  Christian Montgomery is a 32 y.o. male who presents today for heart palpitations.  Palpitations: Patient complains of palpitations daily for the last 6 days. The symptoms are of brief and intermittent severity, occurring 3-4 times per day, and lasting a few seconds per episode. Cardiac risk factors include: male gender. Aggravating factors: none. Denies stress/anxiety. He does drink a lot of soda/caffeine daily. Relieving factors: spontaneous. Associated signs and symptoms: has no complaint(s) of chest pain, chest pressure/discomfort, dyspnea, exertional chest pressure/discomfort, lower extremity edema, and near-syncope  Social History   Tobacco Use  Smoking Status Never  Smokeless Tobacco Never    Current Outpatient Medications on File Prior to Visit  Medication Sig Dispense Refill   ALPRAZolam (XANAX) 0.25 MG tablet Take 1 tablet (0.25 mg total) by mouth at bedtime as needed for anxiety. 30 tablet 1   azelastine (ASTELIN) 0.1 % nasal spray Place into both nostrils 2 (two) times daily. Use in each nostril as directed     dicyclomine (BENTYL) 20 MG tablet TAKE 1 TABLET (20 MG TOTAL) BY MOUTH 2 (TWO) TIMES DAILY. BEFORE LUNCH AND DINNER 180 tablet 0   hyoscyamine (LEVSIN SL) 0.125 MG SL tablet Place under the tongue.     montelukast (SINGULAIR) 10 MG tablet Take 1 tablet (10 mg total) by mouth at bedtime. 90 tablet 3   Multiple Vitamins-Minerals (MULTIVITAMIN ADULTS PO) Take by mouth.     ondansetron (ZOFRAN-ODT) 4 MG disintegrating tablet Take 1 tablet (4 mg total) by mouth every 8 (eight) hours as needed for nausea or vomiting. 20 tablet 0   pantoprazole (PROTONIX) 40 MG tablet TAKE 1 TABLET BY MOUTH EVERY DAY 90 tablet 2   PARoxetine (PAXIL) 20 MG tablet Take 1 tablet (20 mg total) by mouth daily with supper. 90 tablet 1   Probiotic Product (PROBIOTIC DAILY PO) Take by mouth.     propranolol (INDERAL) 10 MG tablet TAKE 1 TABLET (10 MG TOTAL) BY MOUTH 3  (THREE) TIMES DAILY. AS NEEDED FOR RAPID HEART RATE 270 tablet 1   Simethicone (GAS-X PO) Take by mouth.     triamcinolone cream (KENALOG) 0.1 % Apply 1 application. topically 2 (two) times daily. 80 g 0   No current facility-administered medications on file prior to visit.    ROS see history of present illness  Objective  Physical Exam Vitals:   04/07/23 1423  BP: 128/83  Pulse: 73  Temp: 98.5 F (36.9 C)  SpO2: 96%    BP Readings from Last 3 Encounters:  04/07/23 128/83  01/16/23 118/76  01/09/23 110/64   Wt Readings from Last 3 Encounters:  04/07/23 187 lb 3.2 oz (84.9 kg)  01/16/23 183 lb 12.8 oz (83.4 kg)  01/09/23 186 lb 12.8 oz (84.7 kg)    Physical Exam Constitutional:      General: He is not in acute distress.    Appearance: Normal appearance.  HENT:     Head: Normocephalic.  Cardiovascular:     Rate and Rhythm: Normal rate and regular rhythm.     Heart sounds: Normal heart sounds. No murmur heard.    No gallop.  Pulmonary:     Effort: Pulmonary effort is normal.     Breath sounds: Normal breath sounds.  Skin:    General: Skin is warm and dry.  Neurological:     General: No focal deficit present.     Mental Status: He is alert.  Psychiatric:  Mood and Affect: Mood normal.        Behavior: Behavior normal.    Assessment/Plan: Please see individual problem list.  Palpitations Assessment & Plan: EKG reassuring, no acute changes noted. EKG compared to prior. Will refer to Cardiology for cardiac monitor and further evaluation. Advised patient to decrease caffeine intake. He denies any new stressors or anxiety. Strict precautions given to patient, if worsening, chest pain, shortness of breath or syncope to seek immediate care.   Orders: -     EKG 12-Lead -     Ambulatory referral to Cardiology    Return if symptoms worsen or fail to improve.   Bethanie Dicker, NP-C [facility] - [employer]

## 2023-04-20 ENCOUNTER — Ambulatory Visit: Payer: 59 | Admitting: Internal Medicine

## 2023-04-20 DIAGNOSIS — G4733 Obstructive sleep apnea (adult) (pediatric): Secondary | ICD-10-CM | POA: Diagnosis not present

## 2023-05-04 ENCOUNTER — Ambulatory Visit: Payer: 59 | Admitting: Internal Medicine

## 2023-05-12 ENCOUNTER — Other Ambulatory Visit: Payer: Self-pay | Admitting: Internal Medicine

## 2023-05-13 MED ORDER — DICYCLOMINE HCL 20 MG PO TABS: 20.0000 mg | ORAL_TABLET | Freq: Two times a day (BID) | ORAL | 0 refills | Status: DC

## 2023-05-21 DIAGNOSIS — G4733 Obstructive sleep apnea (adult) (pediatric): Secondary | ICD-10-CM | POA: Diagnosis not present

## 2023-05-27 ENCOUNTER — Ambulatory Visit: Payer: 59 | Admitting: Internal Medicine

## 2023-06-05 ENCOUNTER — Encounter: Payer: Self-pay | Admitting: Cardiology

## 2023-06-05 ENCOUNTER — Ambulatory Visit: Payer: 59 | Attending: Cardiology | Admitting: Cardiology

## 2023-06-05 ENCOUNTER — Ambulatory Visit (INDEPENDENT_AMBULATORY_CARE_PROVIDER_SITE_OTHER): Payer: 59

## 2023-06-05 VITALS — BP 112/92 | HR 76 | Ht 74.0 in | Wt 174.0 lb

## 2023-06-05 DIAGNOSIS — R002 Palpitations: Secondary | ICD-10-CM

## 2023-06-05 DIAGNOSIS — G901 Familial dysautonomia [Riley-Day]: Secondary | ICD-10-CM | POA: Diagnosis not present

## 2023-06-05 NOTE — Patient Instructions (Addendum)
Medication Instructions:  Your physician recommends that you continue on your current medications as directed. Please refer to the Current Medication list given to you today.  *If you need a refill on your cardiac medications before your next appointment, please call your pharmacy*  Lab Work: -None ordered  Testing/Procedures: Heart Monitor:  Your physician has requested you wear a ZIO XT heart monitor for 14 days.  Your monitor will be mailed to your home address within 3-5 business days. This is sent via Fed Ex from [employer]. However, if you have not received your monitor after 5 business days please send [place] a MyChart message or call the office at [phone], so we may follow up on this for you.   This monitor is a medical device (single patch monitor) that records the heart's electrical activity. Doctors most often use these monitors to diagnose arrhythmias. Arrhythmias are problems with the speed or rhythm of the heartbeat.   iRhythm supplies 1 patch per enrollment. Additional stickers are not available.  Please DO NOT apply the patch if you will be having a Nuclear Stress Test, Echocardiogram, Cardiac CT, Cardiac MRI, Chest X-ray during the period you would be wearing the monitor. The patch cannot be worn during these tests.  You cannot remove and re-apply the ZIO patch monitor.   Applying the Monitor: Once you receive your monitor, this will include a small razor, abrader, and 4 alcohol pads. Shave hair from upper left chest Rub abrader disc in 40 strokes over the left upper chest as indicated in your monitor instructions Clean area with 4 enclosed alcohol pads (there may be a mild & brief stinging sensation over the newly abraded area, but this is normal). Let dry Apply patch as indicated in monitor instructions. Patch will be placed under collarbone on the left side of the chest with arrow pointing upward. Rub adhesive wings for 2 minutes. Remove white label marked  "1". Remove the white label marked "2". Rub patch adhesive wings for an 2 minutes.  While looking in a mirror, press and release button in the center of the patch. You may hear a "click". A small green light will flash 4-6 times and then stop. This will be your indicator that the monitor has been turned on.  Wearing the Monitor: Avoid showering during the first 24 hours of wearing the monitor.  After 24 hours you may shower with the patch on. Take brief showers with your back facing the shower head.  Avoid excessive sweating to help maximize wear time. Do not submerge the device, no hot tubs, and no swimming pools. Keep any lotions or oils away from the patch. Press the button if you feel a symptom. You will hear a small click. Record date, time, and symptoms in the Patient Logbook or App.  Monitor Issues: Call [employer] Customer Care at [phone] if you have questions regarding your Zio Patch Monitor. Call them immediately if you see an orange/ amber colored light blinking on your monitor. If your monitor falls off and you cannot get this reapplied or if you need suggestions for securing your monitor call [employer] at [phone].   Returning the Monitor: Once you have completed wearing your monitor, follow instructions on the last 2 pages of the Patient Logbook. Stick monitor patch on to the last page of the Patient Logbook.  Place Patient Logbook with monitor in the return box provided. Use locking tab on box and tape box closed securely. The return box has pre-paid  postage on it.  Place the return box in the regular [place] Mail box as soon as possible It will take anywhere from 1-2 weeks for your provider to receive and review your results once you mail this back. If for some reason you have misplaced your return box then call our office and we can provide another box and/or mail it off for you.   Billing  and Patient Assistance Program Information: We have supplied [employer]  with any of your insurance information on file for billing purposes. [employer] offers a sliding scale Patient Assistance Program for patients that do not have insurance, or whose insurance does not completely cover the cost of the ZIO monitor. You must apply for the Patient Assistance Program to qualify for this discounted rate. To apply, please call [employer] at [phone], select option 1, ask to apply for the Patient Assistance Program. [employer] will ask your household income, and how many people are in your household. They will quote your out-of-pocket cost based on that information. [employer] will also be able to set up for a 27-month, interest-free payment plan if needed.      Follow-Up: At [facility], you and your health needs are our priority.  As part of our continuing mission to provide you with exceptional heart care, we have created designated Provider Care Teams.  These Care Teams include your primary Cardiologist (physician) and Advanced Practice Providers (APPs -  Physician Assistants and Nurse Practitioners) who all work together to provide you with the care you need, when you need it.  Your next appointment:   1 month(s) or (After ZIO completion)  Provider:   You may see Debbe Odea, MD or one of the following Advanced Practice Providers on your designated Care Team:   Nicolasa Ducking, NP Eula Listen, PA-C Cadence Fransico Michael, PA-C Charlsie Quest, NP    Other Instructions -YouTube search Zio Patch Heart Monitor 3:46 for step-by-step tutorial on how to apply

## 2023-06-05 NOTE — Progress Notes (Signed)
Cardiology Office Note:    Date:  06/05/2023   ID:  AARICK DOUGE, DOB 11-Aug-1991, MRN [id number]  PCP:  Sherlene Shams, MD   [facility] Providers Cardiologist:  Debbe Odea, MD     Referring MD: Sherlene Shams, MD   Chief Complaint  Patient presents with   New Patient (Initial Visit)    Patient referred by PCP for evaluation of palpitations.  Patient last seen at this clinic in 2016   Christian Montgomery is a 32 y.o. male who is being seen today for the evaluation of palpitations at the request of Darrick Huntsman Mar Daring, MD.   History of Present Illness:    Christian Montgomery is a 32 y.o. male with a hx of anxiety, GERD, dysautonomia who presents due to palpitations.  States having palpitations ongoing over several years now, but worsening over the past 6 months.  Symptoms are not associated with exertion, sometimes associated with dizziness.  Denies syncope, chest pain.  Previously seen in the clinic in 2016 for similar symptoms, diagnosed with dysautonomia.  Cardiac monitor at this time showed sinus tachycardia without significant arrhythmias.  She was tried on a small dose of beta-blocker, developed bradycardia, beta-blocker was stopped.  Endorses drinking sodas, does not drink much water.  Denies edema, orthopnea.  Past Medical History:  Diagnosis Date   Anxiety    Dysautonomia/POTS 09/06/2013   Gastric ulcer with obstruction April 2013   hosp [facility]   GERD (gastroesophageal reflux disease)    Sullivan Lone syndrome    IBS (irritable bowel syndrome)    Tachycardia     Past Surgical History:  Procedure Laterality Date   COLONOSCOPY     COSMETIC SURGERY     NASAL SEPTOPLASTY W/ TURBINOPLASTY Bilateral 12/25/2020   Procedure: NASAL SEPTOPLASTY WITH TURBINATE REDUCTION;  Surgeon: Geanie Logan, MD;  Location: [facility];  Service: ENT;  Laterality: Bilateral;    Current Medications: Current Meds  Medication Sig   ALPRAZolam (XANAX) 0.25 MG tablet Take 1  tablet (0.25 mg total) by mouth at bedtime as needed for anxiety.   dicyclomine (BENTYL) 20 MG tablet Take 1 tablet (20 mg total) by mouth 2 (two) times daily. Before lunch and dinner   montelukast (SINGULAIR) 10 MG tablet Take 1 tablet (10 mg total) by mouth at bedtime.   Multiple Vitamins-Minerals (MULTIVITAMIN ADULTS PO) Take by mouth.   pantoprazole (PROTONIX) 40 MG tablet TAKE 1 TABLET BY MOUTH EVERY DAY   PARoxetine (PAXIL) 20 MG tablet Take 1 tablet (20 mg total) by mouth daily with supper.   Probiotic Product (PROBIOTIC DAILY PO) Take by mouth.   Simethicone (GAS-X PO) Take by mouth.   triamcinolone cream (KENALOG) 0.1 % Apply 1 application. topically 2 (two) times daily.     Allergies:   Milk-related compounds   Social History   Socioeconomic History   Marital status: Single    Spouse name: Not on file   Number of children: Not on file   Years of education: Not on file   Highest education level: 12th grade  Occupational History   Not on file  Tobacco Use   Smoking status: Never   Smokeless tobacco: Never  Vaping Use   Vaping Use: Never used  Substance and Sexual Activity   Alcohol use: No   Drug use: No   Sexual activity: Never  Other Topics Concern   Not on file  Social History Narrative   Not on file  Social Determinants of Health   Financial Resource Strain: Low Risk  (04/07/2023)   Overall Financial Resource Strain (CARDIA)    Difficulty of Paying Living Expenses: Not hard at all  Food Insecurity: No Food Insecurity (04/07/2023)   Hunger Vital Sign    Worried About Running Out of Food in the Last Year: Never true    Ran Out of Food in the Last Year: Never true  Transportation Needs: No Transportation Needs (04/07/2023)   PRAPARE - Administrator, Civil Service (Medical): No    Lack of Transportation (Non-Medical): No  Physical Activity: Sufficiently Active (04/07/2023)   Exercise Vital Sign    Days of Exercise per Week: 5 days    Minutes of  Exercise per Session: 30 min  Stress: No Stress Concern Present (04/07/2023)   [employer] of Occupational Health - Occupational Stress Questionnaire    Feeling of Stress : Only a little  Social Connections: Moderately Integrated (04/07/2023)   Social Connection and Isolation Panel [NHANES]    Frequency of Communication with Friends and Family: More than three times a week    Frequency of Social Gatherings with Friends and Family: Twice a week    Attends Religious Services: 1 to 4 times per year    Active Member of [employer] or Organizations: No    Attends Engineer, structural: Not on file    Marital Status: Married     Family History: The patient's family history includes Alcohol abuse in his father; COPD in his father; Cancer in his maternal grandmother; Diabetes in his maternal grandmother; Diabetes (age of onset: 26) in his sister; Early death in his maternal grandmother; Heart disease in his father and mother; Heart failure in his mother and paternal grandmother.  ROS:   Please see the history of present illness.     All other systems reviewed and are negative.  EKGs/Labs/Other Studies Reviewed:    The following studies were reviewed today: B  EKG Interpretation Date/Time:  Friday June 05 2023 13:12:24 EDT Ventricular Rate:  76 PR Interval:  140 QRS Duration:  96 QT Interval:  374 QTC Calculation: 420 R Axis:   23  Text Interpretation: Normal sinus rhythm with sinus arrhythmia Normal ECG Confirmed by Debbe Odea ([id number]) on 06/05/2023 1:22:21 PM    Recent Labs: 01/16/2023: ALT 14; BUN 16; Creat 1.04; Hemoglobin 14.9; Platelets 211; Potassium 3.9; Sodium 139  Recent Lipid Panel    Component Value Date/Time   CHOL 143 01/05/2023 0824   TRIG 82.0 01/05/2023 0824   HDL 43.20 01/05/2023 0824   CHOLHDL 3 01/05/2023 0824   VLDL 16.4 01/05/2023 0824   LDLCALC 84 01/05/2023 0824     Risk Assessment/Calculations:     HYPERTENSION CONTROL Vitals:   06/05/23  1307 06/05/23 1313  BP: 108/86 (!) 112/92    The patient's blood pressure is elevated above target today.  In order to address the patient's elevated BP: Blood pressure will be monitored at home to determine if medication changes need to be made.            Physical Exam:    VS:  BP (!) 112/92 (BP Location: Right Arm, Patient Position: Sitting, Cuff Size: Normal)   Pulse 76   Ht 6\' 2"  (1.88 m)   Wt 174 lb (78.9 kg)   SpO2 99%   BMI 22.34 kg/m     Wt Readings from Last 3 Encounters:  06/05/23 174 lb (78.9 kg)  04/07/23 187 lb 3.2  oz (84.9 kg)  01/16/23 183 lb 12.8 oz (83.4 kg)     GEN:  Well nourished, well developed in no acute distress HEENT: Normal NECK: No JVD; No carotid bruits CARDIAC: RRR, no murmurs, rubs, gallops RESPIRATORY:  Clear to auscultation without rales, wheezing or rhonchi  ABDOMEN: Soft, non-tender, non-distended MUSCULOSKELETAL:  No edema; No deformity  SKIN: Warm and dry NEUROLOGIC:  Alert and oriented x 3 PSYCHIATRIC:  Normal affect   ASSESSMENT:    1. Palpitation   2. Dysautonomia (HCC)    PLAN:    In order of problems listed above:  Palpitations, place cardiac monitor to evaluate any significant arrhythmias. History of dysautonomia, advised to cut back on sodas, hydration with water advised.  Okay for salt tablets and states seem to have helped patient in the past.  Follow-up after cardiac monitor.      Medication Adjustments/Labs and Tests Ordered: Current medicines are reviewed at length with the patient today.  Concerns regarding medicines are outlined above.  Orders Placed This Encounter  Procedures   LONG TERM MONITOR (3-14 DAYS)   EKG 12-Lead   No orders of the defined types were placed in this encounter.   Patient Instructions  Medication Instructions:  Your physician recommends that you continue on your current medications as directed. Please refer to the Current Medication list given to you today.  *If you need a  refill on your cardiac medications before your next appointment, please call your pharmacy*  Lab Work: -None ordered  Testing/Procedures: Heart Monitor:  Your physician has requested you wear a ZIO XT heart monitor for 14 days.  Your monitor will be mailed to your home address within 3-5 business days. This is sent via Fed Ex from [employer]. However, if you have not received your monitor after 5 business days please send [place] a MyChart message or call the office at [phone], so we may follow up on this for you.   This monitor is a medical device (single patch monitor) that records the heart's electrical activity. Doctors most often use these monitors to diagnose arrhythmias. Arrhythmias are problems with the speed or rhythm of the heartbeat.   [employer] supplies 1 patch per enrollment. Additional stickers are not available.  Please DO NOT apply the patch if you will be having a Nuclear Stress Test, Echocardiogram, Cardiac CT, Cardiac MRI, Chest X-ray during the period you would be wearing the monitor. The patch cannot be worn during these tests.  You cannot remove and re-apply the ZIO patch monitor.   Applying the Monitor: Once you receive your monitor, this will include a small razor, abrader, and 4 alcohol pads. Shave hair from upper left chest Rub abrader disc in 40 strokes over the left upper chest as indicated in your monitor instructions Clean area with 4 enclosed alcohol pads (there may be a mild & brief stinging sensation over the newly abraded area, but this is normal). Let dry Apply patch as indicated in monitor instructions. Patch will be placed under collarbone on the left side of the chest with arrow pointing upward. Rub adhesive wings for 2 minutes. Remove white label marked "1". Remove the white label marked "2". Rub patch adhesive wings for an 2 minutes.  While looking in a mirror, press and release button in the center of the patch. You may hear a "click". A  small green light will flash 4-6 times and then stop. This will be your indicator that the monitor has been turned on.  Wearing the Monitor: Avoid showering during the first 24 hours of wearing the monitor.  After 24 hours you may shower with the patch on. Take brief showers with your back facing the shower head.  Avoid excessive sweating to help maximize wear time. Do not submerge the device, no hot tubs, and no swimming pools. Keep any lotions or oils away from the patch. Press the button if you feel a symptom. You will hear a small click. Record date, time, and symptoms in the Patient Logbook or App.  Monitor Issues: Call [employer] Customer Care at [phone] if you have questions regarding your Zio Patch Monitor. Call them immediately if you see an orange/ amber colored light blinking on your monitor. If your monitor falls off and you cannot get this reapplied or if you need suggestions for securing your monitor call [employer] at [phone].   Returning the Monitor: Once you have completed wearing your monitor, follow instructions on the last 2 pages of the Patient Logbook. Stick monitor patch on to the last page of the Patient Logbook.  Place Patient Logbook with monitor in the return box provided. Use locking tab on box and tape box closed securely. The return box has pre-paid postage on it.  Place the return box in the regular [place] Mail box as soon as possible It will take anywhere from 1-2 weeks for your provider to receive and review your results once you mail this back. If for some reason you have misplaced your return box then call our office and we can provide another box and/or mail it off for you.   Billing  and Patient Assistance Program Information: We have supplied [employer] with any of your insurance information on file for billing purposes. [employer] offers a sliding scale Patient Assistance Program for patients that do not have insurance, or whose insurance  does not completely cover the cost of the ZIO monitor. You must apply for the Patient Assistance Program to qualify for this discounted rate. To apply, please call [employer] at [phone], select option 1, ask to apply for the Patient Assistance Program. [employer] will ask your household income, and how many people are in your household. They will quote your out-of-pocket cost based on that information. [employer] will also be able to set up for a 52-month, interest-free payment plan if needed.      Follow-Up: At [facility], you and your health needs are our priority.  As part of our continuing mission to provide you with exceptional heart care, we have created designated Provider Care Teams.  These Care Teams include your primary Cardiologist (physician) and Advanced Practice Providers (APPs -  Physician Assistants and Nurse Practitioners) who all work together to provide you with the care you need, when you need it.  Your next appointment:   1 month(s) or (After ZIO completion)  Provider:   You may see Debbe Odea, MD or one of the following Advanced Practice Providers on your designated Care Team:   Nicolasa Ducking, NP Eula Listen, PA-C Cadence Fransico Michael, PA-C Charlsie Quest, NP    Other Instructions -YouTube search Zio Patch Heart Monitor 3:46 for step-by-step tutorial on how to apply    Signed, Debbe Odea, MD  06/05/2023 1:56 PM    [facility]

## 2023-06-09 DIAGNOSIS — R002 Palpitations: Secondary | ICD-10-CM

## 2023-06-14 ENCOUNTER — Encounter: Payer: Self-pay | Admitting: Cardiology

## 2023-06-14 DIAGNOSIS — I471 Supraventricular tachycardia, unspecified: Secondary | ICD-10-CM

## 2023-06-18 ENCOUNTER — Encounter: Payer: Self-pay | Admitting: Internal Medicine

## 2023-06-18 NOTE — Telephone Encounter (Signed)
LOV 01/16/23  NOV  01/12/24  Last 11/22

## 2023-06-19 DIAGNOSIS — G4733 Obstructive sleep apnea (adult) (pediatric): Secondary | ICD-10-CM | POA: Diagnosis not present

## 2023-06-20 MED ORDER — ALPRAZOLAM 0.25 MG PO TABS: 0.2500 mg | ORAL_TABLET | Freq: Every evening | ORAL | 0 refills | Status: DC | PRN

## 2023-06-20 NOTE — Telephone Encounter (Signed)
Refill for 30 days only.  OFFICE VISIT NEEDED prior to any more refills 

## 2023-06-24 DIAGNOSIS — R002 Palpitations: Secondary | ICD-10-CM | POA: Diagnosis not present

## 2023-06-24 NOTE — Telephone Encounter (Signed)
Patient is scheduled for 08/26/23.

## 2023-06-25 ENCOUNTER — Other Ambulatory Visit: Payer: Self-pay | Admitting: Internal Medicine

## 2023-06-25 MED ORDER — ALPRAZOLAM 0.25 MG PO TABS: 0.2500 mg | ORAL_TABLET | Freq: Every evening | ORAL | 1 refills | Status: DC | PRN

## 2023-07-16 ENCOUNTER — Encounter (INDEPENDENT_AMBULATORY_CARE_PROVIDER_SITE_OTHER): Payer: Self-pay

## 2023-07-20 DIAGNOSIS — G4733 Obstructive sleep apnea (adult) (pediatric): Secondary | ICD-10-CM | POA: Diagnosis not present

## 2023-07-21 ENCOUNTER — Ambulatory Visit: Payer: 59 | Admitting: Nurse Practitioner

## 2023-07-28 ENCOUNTER — Other Ambulatory Visit: Payer: Self-pay | Admitting: Cardiology

## 2023-07-28 DIAGNOSIS — I471 Supraventricular tachycardia, unspecified: Secondary | ICD-10-CM

## 2023-08-10 ENCOUNTER — Ambulatory Visit: Payer: 59 | Attending: Cardiology

## 2023-08-10 DIAGNOSIS — I471 Supraventricular tachycardia, unspecified: Secondary | ICD-10-CM | POA: Diagnosis not present

## 2023-08-11 LAB — ECHOCARDIOGRAM COMPLETE
Area-P 1/2: 4.31 cm2
S' Lateral: 3.8 cm

## 2023-08-12 ENCOUNTER — Encounter: Payer: Self-pay | Admitting: Internal Medicine

## 2023-08-12 ENCOUNTER — Encounter: Payer: Self-pay | Admitting: Cardiology

## 2023-08-12 ENCOUNTER — Other Ambulatory Visit: Payer: Self-pay | Admitting: Internal Medicine

## 2023-08-12 MED ORDER — PAROXETINE HCL 20 MG PO TABS: 20.0000 mg | ORAL_TABLET | Freq: Every day | ORAL | 1 refills | Status: DC

## 2023-08-20 DIAGNOSIS — G4733 Obstructive sleep apnea (adult) (pediatric): Secondary | ICD-10-CM | POA: Diagnosis not present

## 2023-08-26 ENCOUNTER — Ambulatory Visit: Payer: 59 | Admitting: Internal Medicine

## 2023-08-28 ENCOUNTER — Telehealth: Payer: Self-pay

## 2023-08-28 ENCOUNTER — Ambulatory Visit (INDEPENDENT_AMBULATORY_CARE_PROVIDER_SITE_OTHER): Payer: 59 | Admitting: Internal Medicine

## 2023-08-28 ENCOUNTER — Encounter: Payer: Self-pay | Admitting: Internal Medicine

## 2023-08-28 VITALS — BP 106/70 | HR 93 | Ht 74.0 in | Wt 167.0 lb

## 2023-08-28 DIAGNOSIS — F411 Generalized anxiety disorder: Secondary | ICD-10-CM | POA: Diagnosis not present

## 2023-08-28 DIAGNOSIS — E538 Deficiency of other specified B group vitamins: Secondary | ICD-10-CM

## 2023-08-28 DIAGNOSIS — G4733 Obstructive sleep apnea (adult) (pediatric): Secondary | ICD-10-CM

## 2023-08-28 DIAGNOSIS — R634 Abnormal weight loss: Secondary | ICD-10-CM

## 2023-08-28 DIAGNOSIS — R6889 Other general symptoms and signs: Secondary | ICD-10-CM | POA: Diagnosis not present

## 2023-08-28 DIAGNOSIS — F430 Acute stress reaction: Secondary | ICD-10-CM

## 2023-08-28 DIAGNOSIS — Z23 Encounter for immunization: Secondary | ICD-10-CM

## 2023-08-28 DIAGNOSIS — R0601 Orthopnea: Secondary | ICD-10-CM

## 2023-08-28 DIAGNOSIS — I429 Cardiomyopathy, unspecified: Secondary | ICD-10-CM

## 2023-08-28 MED ORDER — MONTELUKAST SODIUM 10 MG PO TABS: 10.0000 mg | ORAL_TABLET | Freq: Every day | ORAL | 3 refills | Status: DC

## 2023-08-28 MED ORDER — PANTOPRAZOLE SODIUM 40 MG PO TBEC: 40.0000 mg | DELAYED_RELEASE_TABLET | Freq: Every day | ORAL | 2 refills | Status: DC

## 2023-08-28 MED ORDER — DICYCLOMINE HCL 20 MG PO TABS: 20.0000 mg | ORAL_TABLET | Freq: Two times a day (BID) | ORAL | 0 refills | Status: DC

## 2023-08-28 NOTE — Patient Instructions (Signed)
Your untreated sleep apnea may be the cause of your forgetfulness and fatigue   I will reach out to [employer] to arrange for a better fitting mask

## 2023-08-28 NOTE — Progress Notes (Unsigned)
Subjective:  Patient ID: Christian Montgomery, male    DOB: 10/22/1991  Age: 32 y.o. MRN: [id number]  CC: The primary encounter diagnosis was Need for influenza vaccination. Diagnoses of Unintentional weight loss, Orthopnea, Cardiomyopathy, unspecified type (HCC), B12 deficiency, Anxiety as acute reaction to exceptional stress, and Forgetfulness were also pertinent to this visit.   HPI Christian Montgomery presents for  Chief Complaint  Patient presents with   Medical Management of Chronic Issues    Medication refill   SINCE HIS LAST VISIT  REFERRED TO CARDIOLOGY FOR Palpitations  H.O DYSAUTONOMIA.   ZIO ordered b ut problematic  would not stay on .  ECHO WAS done ,  low normal EF of 45%  . Global hypokinesis but technically difficult study due to poor windows.  Has not followed up with Azucena Cecil yet In the office.  Contineus  He reports occasinal orthopnea.    He continues to have palpitations but not daily,  they occur with standing.  Some last for a few seconds, and cause a feeling of light headedness    Forgetting the passcode to his home door ,  has forgotten how to spell his wife's  last name.  Referral for testing advised  .  Happy at home .  Has an 34 month old daughter   feels red a lot ,  feels overwhelmed at times.  Felt the same way in highs school when under pressure   OSA:  by home study,  has trouble getting a good seal.  Not compliant about 50% of the time using a res med full face mask.        Outpatient Medications Prior to Visit  Medication Sig Dispense Refill   ALPRAZolam (XANAX) 0.25 MG tablet Take 1 tablet (0.25 mg total) by mouth at bedtime as needed for anxiety. 30 tablet 1   hyoscyamine (LEVSIN SL) 0.125 MG SL tablet Place under the tongue.     Multiple Vitamins-Minerals (MULTIVITAMIN ADULTS PO) Take by mouth.     PARoxetine (PAXIL) 20 MG tablet Take 1 tablet (20 mg total) by mouth daily with supper. 90 tablet 1   Probiotic Product (PROBIOTIC DAILY PO) Take by mouth.      Simethicone (GAS-X PO) Take by mouth.     triamcinolone cream (KENALOG) 0.1 % Apply 1 application. topically 2 (two) times daily. 80 g 0   dicyclomine (BENTYL) 20 MG tablet TAKE 1 TABLET (20 MG TOTAL) BY MOUTH 2 (TWO) TIMES DAILY. BEFORE LUNCH AND DINNER 180 tablet 0   montelukast (SINGULAIR) 10 MG tablet Take 1 tablet (10 mg total) by mouth at bedtime. 90 tablet 3   pantoprazole (PROTONIX) 40 MG tablet TAKE 1 TABLET BY MOUTH EVERY DAY 90 tablet 2   ondansetron (ZOFRAN-ODT) 4 MG disintegrating tablet Take 1 tablet (4 mg total) by mouth every 8 (eight) hours as needed for nausea or vomiting. (Patient not taking: Reported on 08/28/2023) 20 tablet 0   No facility-administered medications prior to visit.    Review of Systems;  Patient denies headache, fevers, malaise, unintentional weight loss, skin rash, eye pain, sinus congestion and sinus pain, sore throat, dysphagia,  hemoptysis , cough, dyspnea, wheezing, chest pain, palpitations, orthopnea, edema, abdominal pain, nausea, melena, diarrhea, constipation, flank pain, dysuria, hematuria, urinary  Frequency, nocturia, numbness, tingling, seizures,  Focal weakness, Loss of consciousness,  Tremor, insomnia, depression, anxiety, and suicidal ideation.      Objective:  BP 106/70   Pulse 93   Ht 6'  2" (1.88 m)   Wt 167 lb (75.8 kg)   SpO2 98%   BMI 21.44 kg/m   BP Readings from Last 3 Encounters:  08/28/23 106/70  06/05/23 (!) 112/92  04/07/23 128/83    Wt Readings from Last 3 Encounters:  08/28/23 167 lb (75.8 kg)  06/05/23 174 lb (78.9 kg)  04/07/23 187 lb 3.2 oz (84.9 kg)    Physical Exam  Lab Results  Component Value Date   HGBA1C 5.5 01/05/2023   HGBA1C 5.5 01/06/2022   HGBA1C 5.4 05/27/2013    Lab Results  Component Value Date   CREATININE 0.96 08/28/2023   CREATININE 1.04 01/16/2023   CREATININE 1.04 01/05/2023    Lab Results  Component Value Date   WBC 4.5 01/16/2023   HGB 14.9 01/16/2023   HCT 43.1 01/16/2023    PLT 211 01/16/2023   GLUCOSE 72 08/28/2023   CHOL 143 01/05/2023   TRIG 82.0 01/05/2023   HDL 43.20 01/05/2023   LDLCALC 84 01/05/2023   ALT 11 08/28/2023   AST 16 08/28/2023   NA 139 08/28/2023   K 3.8 08/28/2023   CL 102 08/28/2023   CREATININE 0.96 08/28/2023   BUN 13 08/28/2023   CO2 24 08/28/2023   TSH 0.97 08/28/2023   PSA 1.41 01/16/2023   INR 1.3 (H) 02/24/2014   HGBA1C 5.5 01/05/2023    US Abdomen Limited RUQ (LIVER/GB)  Result Date: 01/15/2023 CLINICAL DATA:  Elevated bilirubin. EXAM: ULTRASOUND ABDOMEN LIMITED RIGHT UPPER QUADRANT COMPARISON:  September 15, 2013 FINDINGS: Gallbladder: No gallstones or wall thickening visualized. No sonographic Murphy sign noted by sonographer. Common bile duct: Diameter: 2.9 mm Liver: No focal lesion identified. Within normal limits in parenchymal echogenicity. Portal vein is patent on color Doppler imaging with normal direction of blood flow towards the liver. Other: None. IMPRESSION: Normal right upper quadrant ultrasound. Electronically Signed   By: Sherian Rein M.D.   On: 01/15/2023 09:43    Assessment & Plan:  .Need for influenza vaccination -     Flu vaccine trivalent PF, 6mos and older(Flulaval,Afluria,Fluarix,Fluzone)  Unintentional weight loss -     TSH -     Comprehensive metabolic panel  Orthopnea -     Brain natriuretic peptide  Cardiomyopathy, unspecified type (HCC) -     Urinalysis, Routine w reflex microscopic  B12 deficiency Assessment & Plan: His dizziness has resolved with  B12 supplementation    Anxiety as acute reaction to exceptional stress Assessment & Plan: Improved with birth of daughter , [facility] , born 3 weeks premature on Jan 22    Forgetfulness Assessment & Plan: Screening labs normal.  Advised to limit multi tasking and improve tolerance to CPAP.  He has deferred formal evaluation.     Other orders -     Dicyclomine HCl; Take 1 tablet (20 mg total) by mouth 2 (two) times daily.  Before lunch and dinner  Dispense: 180 tablet; Refill: 0 -     Montelukast Sodium; Take 1 tablet (10 mg total) by mouth at bedtime.  Dispense: 90 tablet; Refill: 3 -     Pantoprazole Sodium; Take 1 tablet (40 mg total) by mouth daily.  Dispense: 90 tablet; Refill: 2 -     Extra Urine Specimen     I provided 30 minutes of face-to-face time during this encounter reviewing patient's last visit with me, patient's  most recent visit with cardiology,  nephrology,  and neurology,  recent surgical and non surgical procedures, previous  labs  and imaging studies, counseling on currently addressed issues,  and post visit ordering to diagnostics and therapeutics .   Follow-up: Return in about 3 months (around 11/27/2023).   Sherlene Shams, MD

## 2023-08-28 NOTE — Telephone Encounter (Signed)
Secure chat message from Dr. Darrick Huntsman:   patient is having difficulty getting a good seal on his CPAP res med face maksk. can you call [employer] to arrange a fitting?

## 2023-08-29 DIAGNOSIS — R6889 Other general symptoms and signs: Secondary | ICD-10-CM | POA: Insufficient documentation

## 2023-08-29 LAB — COMPREHENSIVE METABOLIC PANEL
AG Ratio: 2.3 (calc) (ref 1.0–2.5)
ALT: 11 U/L (ref 9–46)
AST: 16 U/L (ref 10–40)
Albumin: 4.8 g/dL (ref 3.6–5.1)
Alkaline phosphatase (APISO): 51 U/L (ref 36–130)
BUN: 13 mg/dL (ref 7–25)
CO2: 24 mmol/L (ref 20–32)
Calcium: 9.5 mg/dL (ref 8.6–10.3)
Chloride: 102 mmol/L (ref 98–110)
Creat: 0.96 mg/dL (ref 0.60–1.26)
Globulin: 2.1 g/dL (ref 1.9–3.7)
Glucose, Bld: 72 mg/dL (ref 65–99)
Potassium: 3.8 mmol/L (ref 3.5–5.3)
Sodium: 139 mmol/L (ref 135–146)
Total Bilirubin: 2.7 mg/dL — ABNORMAL HIGH (ref 0.2–1.2)
Total Protein: 6.9 g/dL (ref 6.1–8.1)

## 2023-08-29 LAB — URINALYSIS, ROUTINE W REFLEX MICROSCOPIC
Bilirubin Urine: NEGATIVE
Glucose, UA: NEGATIVE
Hgb urine dipstick: NEGATIVE
Ketones, ur: NEGATIVE
Leukocytes,Ua: NEGATIVE
Nitrite: NEGATIVE
Protein, ur: NEGATIVE
Specific Gravity, Urine: 1.023 (ref 1.001–1.035)
pH: 5.5 (ref 5.0–8.0)

## 2023-08-29 LAB — EXTRA URINE SPECIMEN

## 2023-08-29 LAB — BRAIN NATRIURETIC PEPTIDE: Brain Natriuretic Peptide: 8 pg/mL (ref <100)

## 2023-08-29 LAB — TSH: TSH: 0.97 m[IU]/L (ref 0.40–4.50)

## 2023-08-29 NOTE — Assessment & Plan Note (Signed)
Screening labs normal.  Advised to limit multi tasking and improve tolerance to CPAP.  He has deferred formal evaluation.

## 2023-08-29 NOTE — Assessment & Plan Note (Signed)
Diagnosed by prior sleep study. Patient is intolerant of CPAP mask most nights.  And reports poor sleep due to ill fitting mask.  Will ask April to address

## 2023-08-29 NOTE — Assessment & Plan Note (Signed)
Improved with birth of daughter , [facility] , born 3 weeks premature on Jan 22

## 2023-08-29 NOTE — Assessment & Plan Note (Signed)
His dizziness has resolved with  B12 supplementation

## 2023-09-03 NOTE — Telephone Encounter (Signed)
LMTCB with Arlys John with [facility].

## 2023-09-04 ENCOUNTER — Encounter: Payer: Self-pay | Admitting: Nurse Practitioner

## 2023-09-04 ENCOUNTER — Ambulatory Visit: Payer: 59 | Attending: Nurse Practitioner | Admitting: Nurse Practitioner

## 2023-09-04 VITALS — BP 110/80 | HR 55 | Ht 74.0 in | Wt 167.5 lb

## 2023-09-04 DIAGNOSIS — I429 Cardiomyopathy, unspecified: Secondary | ICD-10-CM

## 2023-09-04 DIAGNOSIS — R002 Palpitations: Secondary | ICD-10-CM

## 2023-09-04 DIAGNOSIS — G90A Postural orthostatic tachycardia syndrome (POTS): Secondary | ICD-10-CM | POA: Diagnosis not present

## 2023-09-04 DIAGNOSIS — R072 Precordial pain: Secondary | ICD-10-CM

## 2023-09-04 NOTE — Patient Instructions (Signed)
Medication Instructions:  No changes *If you need a refill on your cardiac medications before your next appointment, please call your pharmacy*   Lab Work: None ordered If you have labs (blood work) drawn today and your tests are completely normal, you will receive your results only by: MyChart Message (if you have MyChart) OR A paper copy in the mail If you have any lab test that is abnormal or we need to change your treatment, we will call you to review the results.   Testing/Procedures: Your provider has ordered a exercise tolerance test. This test will evaluate the blood supply to your heart muscle during periods of exercise and rest. For this test, you will raise your heart rate by walking on a treadmill at different levels.   you may eat a light breakfast/ lunch prior to your procedure no caffeine for 24 hours prior to your test (coffee, tea, soft drinks, or chocolate)  no smoking/ vaping for 4 hours prior to your test you may take your regular medications the day of your test except for:   - nothing to hold bring any inhalers with you to your test wear comfortable clothing & tennis/ non-skid shoes to walk on the treadmill  This will take place at [address] ([facility] [address], [place] [address]    Follow-Up: At  [facility], you and your health needs are our priority.  As part of our continuing mission to provide you with exceptional heart care, we have created designated Provider Care Teams.  These Care Teams include your primary Cardiologist (physician) and Advanced Practice Providers (APPs -  Physician Assistants and Nurse Practitioners) who all work together to provide you with the care you need, when you need it.  We recommend signing up for the patient portal called "MyChart".  Sign up information is provided on this After Visit Summary.  MyChart is used to connect with patients for Virtual Visits (Telemedicine).  Patients are able to view  lab/test results, encounter notes, upcoming appointments, etc.  Non-urgent messages can be sent to your provider as well.   To learn more about what you can do with MyChart, go to [email or web address].    Your next appointment:   1 month(s)  Provider:   You may see Dr. Graciela Husbands or one of the following Advanced Practice Providers on your designated Care Team:   Nicolasa Ducking, NP

## 2023-09-04 NOTE — Progress Notes (Signed)
Office Visit    Patient Name: Christian Montgomery Date of Encounter: 09/04/2023  Primary Care Provider:  Sherlene Shams, MD Primary Cardiologist:  Debbe Odea, MD  Chief Complaint    32 y.o. male with a history of anxiety, GERD, dysautonomia, POTS, and palpitations, who presents for follow-up related to palpitations and abnormal echo.  Past Medical History    Past Medical History:  Diagnosis Date   Anxiety    Cardiomyopathy (HCC)    a. 08/2023 Echo: EF 45-50%, glob HK, mildly enlarged RV, no significant valvular disease.   Dysautonomia/POTS 09/06/2013   Gastric ulcer with obstruction 03/2012   hosp [facility]   GERD (gastroesophageal reflux disease)    Sullivan Lone syndrome    IBS (irritable bowel syndrome)    Palpitations    a. 06/2023 Zio: trouble w/ adhesion - 2 monitors over ~ 7 days - avg HR 70's. Rare PACs/PVCs. No sustained arrhythmias/triggered events.   POTS (postural orthostatic tachycardia syndrome)    Tachycardia    Past Surgical History:  Procedure Laterality Date   COLONOSCOPY     COSMETIC SURGERY     NASAL SEPTOPLASTY W/ TURBINOPLASTY Bilateral 12/25/2020   Procedure: NASAL SEPTOPLASTY WITH TURBINATE REDUCTION;  Surgeon: Geanie Logan, MD;  Location: [facility];  Service: ENT;  Laterality: Bilateral;    Allergies  Allergies  Allergen Reactions   Milk-Related Compounds     GI upset    History of Present Illness      32 y.o. y/o male with a history of anxiety, GERD, dysautonomia, POTS, and palpitations.  He was originally seen as a 32 year old in 2014 with complaints of dizziness and palpitations.  An event monitor was placed and he was found to have sinus tachycardia associated with prolonged standing.   He was referred to electrophysiology and diagnosed with dysautonomia and POTS.  Beta-blocker was discontinued.  He was subsequently lost to follow-up and reestablish care with Dr. Azucena Cecil in July 2024 due to worsening palpitations over  the preceding 6 months.  Event monitoring over was ordered however due to difficulty with medication, over the course of 2 separate monitors, only about 7 days were completed.  Average heart rates were in the 70s without any significant arrhythmias.  An echo was also carried out in September 2024, and showed mildly reduced LV function with an EF of 45 to 50%, global hypokinesis, and no significant valvular disease.   Over the past 3 weeks, Christian Montgomery has been experiencing intermittent chest discomfort, typically associated with palpitations and a sensation of an increased heart rate.  This occurs randomly and is most often associated with stress or anxiety.  Sometimes symptoms occur with activity and may be associated with dyspnea, but frequently occur with rest.  Symptoms last seconds to minutes and resolve spontaneously.  He denies PND, orthopnea, dizziness, syncope, edema, or early satiety.  Home Medications    Current Outpatient Medications  Medication Sig Dispense Refill   ALPRAZolam (XANAX) 0.25 MG tablet Take 1 tablet (0.25 mg total) by mouth at bedtime as needed for anxiety. 30 tablet 1   dicyclomine (BENTYL) 20 MG tablet Take 1 tablet (20 mg total) by mouth 2 (two) times daily. Before lunch and dinner 180 tablet 0   hyoscyamine (LEVSIN SL) 0.125 MG SL tablet Place under the tongue.     montelukast (SINGULAIR) 10 MG tablet Take 1 tablet (10 mg total) by mouth at bedtime. 90 tablet 3   Multiple Vitamins-Minerals (MULTIVITAMIN ADULTS PO)  Take by mouth.     pantoprazole (PROTONIX) 40 MG tablet Take 1 tablet (40 mg total) by mouth daily. 90 tablet 2   PARoxetine (PAXIL) 20 MG tablet Take 1 tablet (20 mg total) by mouth daily with supper. 90 tablet 1   Probiotic Product (PROBIOTIC DAILY PO) Take by mouth.     Simethicone (GAS-X PO) Take by mouth.     triamcinolone cream (KENALOG) 0.1 % Apply 1 application. topically 2 (two) times daily. 80 g 0   ondansetron (ZOFRAN-ODT) 4 MG disintegrating tablet  Take 1 tablet (4 mg total) by mouth every 8 (eight) hours as needed for nausea or vomiting. (Patient not taking: Reported on 08/28/2023) 20 tablet 0   No current facility-administered medications for this visit.     Review of Systems    Chest discomfort and palpitations is a lot above.  He denies PND, orthopnea, dizziness, syncope, edema, or early satiety.  All other systems reviewed and are otherwise negative except as noted above.    Physical Exam    VS:  BP 110/80 (BP Location: Left Arm, Patient Position: Sitting, Cuff Size: Normal)   Pulse (!) 55   Ht 6\' 2"  (1.88 m)   Wt 167 lb 8 oz (76 kg)   SpO2 98%   BMI 21.51 kg/m  , BMI Body mass index is 21.51 kg/m.     GEN: Well nourished, well developed, in no acute distress. HEENT: normal. Neck: Supple, no JVD, carotid bruits, or masses. Cardiac: RRR, no murmurs, rubs, or gallops. No clubbing, cyanosis, edema.  Radials 2+/PT 2+ and equal bilaterally.  Respiratory:  Respirations regular and unlabored, clear to auscultation bilaterally. GI: Soft, nontender, nondistended, BS + x 4. MS: no deformity or atrophy. Skin: warm and dry, no rash. Neuro:  Strength and sensation are intact. Psych: Normal affect.  Accessory Clinical Findings    ECG personally reviewed by me today - EKG Interpretation Date/Time:  Friday September 04 2023 11:41:13 EDT Ventricular Rate:  55 PR Interval:  140 QRS Duration:  100 QT Interval:  418 QTC Calculation: 399 R Axis:   52  Text Interpretation: Sinus bradycardia When compared with ECG of 05-Jun-2023 13:12, No significant change was found Confirmed by Nicolasa Ducking [phone]) on 09/04/2023 11:47:48 AM  - no acute changes.  Lab Results  Component Value Date   WBC 4.5 01/16/2023   HGB 14.9 01/16/2023   HCT 43.1 01/16/2023   MCV 87.4 01/16/2023   PLT 211 01/16/2023   Lab Results  Component Value Date   CREATININE 0.96 08/28/2023   BUN 13 08/28/2023   NA 139 08/28/2023   K 3.8 08/28/2023   CL 102  08/28/2023   CO2 24 08/28/2023   Lab Results  Component Value Date   ALT 11 08/28/2023   AST 16 08/28/2023   ALKPHOS 49 01/05/2023   BILITOT 2.7 (H) 08/28/2023   Lab Results  Component Value Date   CHOL 143 01/05/2023   HDL 43.20 01/05/2023   LDLCALC 84 01/05/2023   TRIG 82.0 01/05/2023   CHOLHDL 3 01/05/2023    Lab Results  Component Value Date   HGBA1C 5.5 01/05/2023    Assessment & Plan    1.  Precordial chest pain: Over the past 3 weeks, patient has been having intermittent chest pain often associated with elevated heart rates, sometimes associated with dyspnea, occurring at rest, sometimes with exertion, and frequently with stress or anxiety.  Symptoms last a few seconds to minutes, resolved spontaneously.  He  recently underwent event monitoring but due to difficulty with adhesion of the Zio devices to his skin, monitoring was limited to roughly 7 days without any significant arrhythmias.  Recent echo showed mild LV dysfunction with an EF of 45 to 50% with global hypokinesis.  In the setting of echo findings and chest pain, I will arrange for an exercise treadmill test (ECG normal today).  2.  Palpitations/POTS: He has not necessarily been experiencing significant tachycardia with position changes/standing however, he has had palpitations associated with anxiety as outlined above.  Previous monitoring was limited by difficulty with adhesion of the device though as noted above, did not show any significant arrhythmias.  He is considering buying a Kardia mobile device.  We also discussed potential use of smart watches or even something as simple as a heart rate application on his phone or pulse oximeter simply to document what his heart rates are during his symptoms, if Lourena Simmonds mobile is because prohibitive.  Exercise treadmill test plan as outlined above to see if we can replicate symptoms.  3.  Cardiomyopathy: EF 45 to 50% with global hypokinesis by recent echo.  Discussed in detail  today.  Ischemic evaluation as outlined above.  He is euvolemic on examination, bradycardic, and with relatively soft blood pressure.  As result, we will defer beta-blocker/ACE/ARB at this time.  Consider cardiac MRI to rule out infiltrative process pending ischemic evaluation.  4.  Disposition: Follow-up exercise treadmill testing.  Follow-up in clinic in 1 month or sooner if necessary.  Nicolasa Ducking, NP 09/04/2023, 11:50 AM

## 2023-09-09 NOTE — Telephone Encounter (Signed)
Spoke with [facility] and she stated that she is needing a script that says need for mask refit.

## 2023-09-09 NOTE — Telephone Encounter (Signed)
Script has been placed in quick sign folder for signature.

## 2023-09-09 NOTE — Addendum Note (Signed)
Addended by: Sandy Salaam on: 09/09/2023 02:08 PM   Modules accepted: Orders

## 2023-09-10 NOTE — Telephone Encounter (Signed)
faxed

## 2023-09-15 ENCOUNTER — Ambulatory Visit
Admission: RE | Admit: 2023-09-15 | Discharge: 2023-09-15 | Disposition: A | Discharge status: Home / Self Care | Payer: 59 | Source: Ambulatory Visit | Attending: Nurse Practitioner | Admitting: Nurse Practitioner

## 2023-09-15 DIAGNOSIS — R072 Precordial pain: Secondary | ICD-10-CM | POA: Insufficient documentation

## 2023-09-15 LAB — EXERCISE TOLERANCE TEST
Angina Index: 1
Duke Treadmill Score: 10
Estimated workload: 13.4
Exercise duration (min): 13 min
Exercise duration (sec): 31 s
MPHR: 186 {beats}/min
Peak HR: 162 {beats}/min
Percent HR: 87 %
Rest HR: 64 {beats}/min
ST Depression (mm): 0 mm

## 2023-09-17 ENCOUNTER — Other Ambulatory Visit: Payer: Self-pay | Admitting: *Deleted

## 2023-09-17 DIAGNOSIS — G4733 Obstructive sleep apnea (adult) (pediatric): Secondary | ICD-10-CM | POA: Diagnosis not present

## 2023-09-17 DIAGNOSIS — I429 Cardiomyopathy, unspecified: Secondary | ICD-10-CM

## 2023-10-05 ENCOUNTER — Other Ambulatory Visit: Payer: Self-pay

## 2023-10-05 DIAGNOSIS — I429 Cardiomyopathy, unspecified: Secondary | ICD-10-CM

## 2023-10-06 LAB — BASIC METABOLIC PANEL
BUN/Creatinine Ratio: 7 — ABNORMAL LOW (ref 9–20)
BUN: 8 mg/dL (ref 6–20)
CO2: 25 mmol/L (ref 20–29)
Calcium: 9.3 mg/dL (ref 8.7–10.2)
Chloride: 101 mmol/L (ref 96–106)
Creatinine, Ser: 1.14 mg/dL (ref 0.76–1.27)
Glucose: 92 mg/dL (ref 70–99)
Potassium: 5 mmol/L (ref 3.5–5.2)
Sodium: 140 mmol/L (ref 134–144)
eGFR: 87 mL/min/{1.73_m2} (ref >59)

## 2023-10-06 LAB — HEMOGLOBIN AND HEMATOCRIT, BLOOD
Hematocrit: 43.4 % (ref 37.5–51.0)
Hemoglobin: 14.5 g/dL (ref 13.0–17.7)

## 2023-10-12 DIAGNOSIS — H52223 Regular astigmatism, bilateral: Secondary | ICD-10-CM | POA: Diagnosis not present

## 2023-10-18 DIAGNOSIS — G4733 Obstructive sleep apnea (adult) (pediatric): Secondary | ICD-10-CM | POA: Diagnosis not present

## 2023-10-22 ENCOUNTER — Encounter: Payer: Self-pay | Admitting: Nurse Practitioner

## 2023-10-22 ENCOUNTER — Ambulatory Visit: Payer: 59 | Attending: Nurse Practitioner | Admitting: Nurse Practitioner

## 2023-10-22 VITALS — BP 110/72 | HR 69 | Ht 74.0 in | Wt 173.2 lb

## 2023-10-22 DIAGNOSIS — R072 Precordial pain: Secondary | ICD-10-CM | POA: Diagnosis not present

## 2023-10-22 DIAGNOSIS — R002 Palpitations: Secondary | ICD-10-CM | POA: Diagnosis not present

## 2023-10-22 DIAGNOSIS — I429 Cardiomyopathy, unspecified: Secondary | ICD-10-CM | POA: Diagnosis not present

## 2023-10-22 NOTE — Progress Notes (Signed)
Office Visit    Patient Name: Christian Montgomery Date of Encounter: 10/22/2023  Primary Care Provider:  Sherlene Shams, MD Primary Cardiologist:  Debbe Odea, MD  Chief Complaint    32 y.o. male  with a history of anxiety, GERD, dysautonomia, POTS, and palpitations, who presents for follow-up related to chest pain and cardiomyopathy.  Past Medical History  Subjective   Past Medical History:  Diagnosis Date   Anxiety    Cardiomyopathy (HCC)    a. 08/2023 Echo: EF 45-50%, glob HK, mildly enlarged RV, no significant valvular disease.   Chest pain    a. 09/2023 ETT: Ex time - 13:31 , max HR - 162, 13.4 METS, No acute ST/T changes->low risk study.   Dysautonomia/POTS 09/06/2013   Gastric ulcer with obstruction 03/2012   hosp [facility]   GERD (gastroesophageal reflux disease)    Sullivan Lone syndrome    IBS (irritable bowel syndrome)    Palpitations    a. 06/2023 Zio: trouble w/ adhesion - 2 monitors over ~ 7 days - avg HR 70's. Rare PACs/PVCs. No sustained arrhythmias/triggered events.   POTS (postural orthostatic tachycardia syndrome)    Tachycardia    Past Surgical History:  Procedure Laterality Date   COLONOSCOPY     COSMETIC SURGERY     NASAL SEPTOPLASTY W/ TURBINOPLASTY Bilateral 12/25/2020   Procedure: NASAL SEPTOPLASTY WITH TURBINATE REDUCTION;  Surgeon: Geanie Logan, MD;  Location: [facility];  Service: ENT;  Laterality: Bilateral;    Allergies  Allergies  Allergen Reactions   Milk-Related Compounds     GI upset      History of Present Illness      32 y.o. y/o male with a history of anxiety, GERD, dysautonomia, POTS, and palpitations.  He was originally seen as a 32 year old in 2014 with complaints of dizziness and palpitations.  An event monitor was placed and he was found to have sinus tachycardia associated with prolonged standing.   He was referred to electrophysiology and diagnosed with dysautonomia and POTS.  Beta-blocker was discontinued.   He was subsequently lost to follow-up and reestablish care with Dr. Azucena Cecil in July 2024 due to worsening palpitations over the preceding 6 months.  Event monitoring over was ordered however due to difficulty with medication, over the course of 2 separate monitors, only about 7 days were completed.  Average heart rates were in the 70s without any significant arrhythmias.  An echo was also carried out in September 2024, and showed mildly reduced LV function with an EF of 45 to 50%, global hypokinesis, and no significant valvular disease.  He complained of precordial chest pain at office follow-up in October 2024, and subsequent exercise treadmill test showed excellent exercise tolerance, walking 13 minutes and 31 seconds, without acute ST or T changes.  Cardiac MRI was ordered in the setting of cardiomyopathy.     Patient is scheduled for MRI in December.  He is otherwise stable.  He continues to experience brief episodic palpitations 1 or 2 times a day, lasting a few seconds, resolved spontaneously.  Has been no significant change in occurrence of the symptoms since he wore his monitor.  He denies chest pain, dyspnea, PND, orthopnea, dizziness, syncope, edema, or early satiety. Objective  Home Medications    Current Outpatient Medications  Medication Sig Dispense Refill   ALPRAZolam (XANAX) 0.25 MG tablet Take 1 tablet (0.25 mg total) by mouth at bedtime as needed for anxiety. 30 tablet 1   dicyclomine (  BENTYL) 20 MG tablet Take 1 tablet (20 mg total) by mouth 2 (two) times daily. Before lunch and dinner 180 tablet 0   montelukast (SINGULAIR) 10 MG tablet Take 1 tablet (10 mg total) by mouth at bedtime. 90 tablet 3   Multiple Vitamins-Minerals (MULTIVITAMIN ADULTS PO) Take by mouth.     pantoprazole (PROTONIX) 40 MG tablet Take 1 tablet (40 mg total) by mouth daily. 90 tablet 2   PARoxetine (PAXIL) 20 MG tablet Take 1 tablet (20 mg total) by mouth daily with supper. 90 tablet 1   Probiotic  Product (PROBIOTIC DAILY PO) Take by mouth.     Simethicone (GAS-X PO) Take by mouth.     triamcinolone cream (KENALOG) 0.1 % Apply 1 application. topically 2 (two) times daily. 80 g 0   No current facility-administered medications for this visit.     Physical Exam    VS:  BP 110/72 (BP Location: Left Arm, Patient Position: Bed low/side rails up, Cuff Size: Normal)   Pulse 69   Ht 6\' 2"  (1.88 m)   Wt 173 lb 3.2 oz (78.6 kg)   SpO2 97%   BMI 22.24 kg/m  , BMI Body mass index is 22.24 kg/m.       GEN: Well nourished, well developed, in no acute distress. HEENT: normal. Neck: Supple, no JVD, carotid bruits, or masses. Cardiac: RRR, no murmurs, rubs, or gallops. No clubbing, cyanosis, edema.  Radials 2+/PT 2+ and equal bilaterally.  Respiratory:  Respirations regular and unlabored, clear to auscultation bilaterally. GI: Soft, nontender, nondistended, BS + x 4. MS: no deformity or atrophy. Skin: warm and dry, no rash. Neuro:  Strength and sensation are intact. Psych: Normal affect.  Accessory Clinical Findings    ECG personally reviewed by me today - EKG Interpretation Date/Time:  Thursday October 22 2023 10:19:29 EST Ventricular Rate:  69 PR Interval:  140 QRS Duration:  96 QT Interval:  380 QTC Calculation: 407 R Axis:   45  Text Interpretation: Normal sinus rhythm with sinus arrhythmia Normal ECG Confirmed by Nicolasa Ducking [phone]) on 10/22/2023 10:42:35 AM  - no acute changes.  Lab Results  Component Value Date   WBC 4.5 01/16/2023   HGB 14.5 10/05/2023   HCT 43.4 10/05/2023   MCV 87.4 01/16/2023   PLT 211 01/16/2023   Lab Results  Component Value Date   CREATININE 1.14 10/05/2023   BUN 8 10/05/2023   NA 140 10/05/2023   K 5.0 10/05/2023   CL 101 10/05/2023   CO2 25 10/05/2023   Lab Results  Component Value Date   ALT 11 08/28/2023   AST 16 08/28/2023   ALKPHOS 49 01/05/2023   BILITOT 2.7 (H) 08/28/2023   Lab Results  Component Value Date    CHOL 143 01/05/2023   HDL 43.20 01/05/2023   LDLCALC 84 01/05/2023   TRIG 82.0 01/05/2023   CHOLHDL 3 01/05/2023    Lab Results  Component Value Date   HGBA1C 5.5 01/05/2023   Lab Results  Component Value Date   TSH 0.97 08/28/2023       Assessment & Plan    1.  Precordial chest pain: Recent low-risk ETT.  No recurrent c/p.  Prior echo w/ EF 45-50% w/ global HK.  Awaiting cMRI.  2.  Palpitations/POTS:  prior monitoring unremarkable.  Ongoing, brief episodes of palpitations lasting a few secs and resolving spontaneously.  Reassurance provided.  3.  Cardiomyopathy:  EF 45-50% glob hypokinesis by echo.  cMRI pending to  r/o infiltrative process.  Euvolemic.  H/o bradycardia and soft BPs precluding ? blocker, acei/arb/arni/mra.  4.  Dispo:  f/u cMRI.  F/u clinic 6 mos or sooner if significant abnormalities on MRI.  Nicolasa Ducking, NP 10/22/2023, 1:13 PM

## 2023-10-22 NOTE — Patient Instructions (Signed)
Medication Instructions:  Your Physician recommend you continue on your current medication as directed.    *If you need a refill on your cardiac medications before your next appointment, please call your pharmacy*   Lab Work: None ordered.  If you have labs (blood work) drawn today and your tests are completely normal, you will receive your results only by: MyChart Message (if you have MyChart) OR A paper copy in the mail If you have any lab test that is abnormal or we need to change your treatment, we will call you to review the results.   Testing/Procedures: None ordered.    Follow-Up: At [facility], you and your health needs are our priority.  As part of our continuing mission to provide you with exceptional heart care, we have created designated Provider Care Teams.  These Care Teams include your primary Cardiologist (physician) and Advanced Practice Providers (APPs -  Physician Assistants and Nurse Practitioners) who all work together to provide you with the care you need, when you need it.  We recommend signing up for the patient portal called "MyChart".  Sign up information is provided on this After Visit Summary.  MyChart is used to connect with patients for Virtual Visits (Telemedicine).  Patients are able to view lab/test results, encounter notes, upcoming appointments, etc.  Non-urgent messages can be sent to your provider as well.   To learn more about what you can do with MyChart, go to [email or web address].    Your next appointment:   6 month(s)  Provider:   Debbe Odea, MD

## 2023-11-09 ENCOUNTER — Encounter (HOSPITAL_COMMUNITY): Payer: Self-pay

## 2023-11-11 ENCOUNTER — Ambulatory Visit
Admission: RE | Admit: 2023-11-11 | Discharge: 2023-11-11 | Disposition: A | Discharge status: Home / Self Care | Payer: 59 | Source: Ambulatory Visit | Attending: Nurse Practitioner | Admitting: Nurse Practitioner

## 2023-11-11 ENCOUNTER — Other Ambulatory Visit: Payer: Self-pay | Admitting: Nurse Practitioner

## 2023-11-11 DIAGNOSIS — I429 Cardiomyopathy, unspecified: Secondary | ICD-10-CM | POA: Insufficient documentation

## 2023-11-11 DIAGNOSIS — R0989 Other specified symptoms and signs involving the circulatory and respiratory systems: Secondary | ICD-10-CM | POA: Diagnosis not present

## 2023-11-11 MED ORDER — GADOBUTROL 1 MMOL/ML IV SOLN
10.0000 mL | Freq: Once | INTRAVENOUS | Status: AC | PRN
  Administered 2023-11-11: 10 mL via INTRAVENOUS

## 2023-11-17 DIAGNOSIS — G4733 Obstructive sleep apnea (adult) (pediatric): Secondary | ICD-10-CM | POA: Diagnosis not present

## 2023-11-27 ENCOUNTER — Ambulatory Visit: Payer: 59 | Admitting: Internal Medicine

## 2023-12-08 ENCOUNTER — Ambulatory Visit: Payer: 59 | Admitting: Internal Medicine

## 2023-12-11 ENCOUNTER — Ambulatory Visit (INDEPENDENT_AMBULATORY_CARE_PROVIDER_SITE_OTHER): Payer: 59 | Admitting: Internal Medicine

## 2023-12-11 ENCOUNTER — Ambulatory Visit: Payer: 59 | Admitting: Internal Medicine

## 2023-12-11 ENCOUNTER — Encounter: Payer: Self-pay | Admitting: Internal Medicine

## 2023-12-11 VITALS — BP 106/68 | HR 87 | Ht 74.0 in | Wt 168.4 lb

## 2023-12-11 DIAGNOSIS — E538 Deficiency of other specified B group vitamins: Secondary | ICD-10-CM | POA: Diagnosis not present

## 2023-12-11 DIAGNOSIS — E7800 Pure hypercholesterolemia, unspecified: Secondary | ICD-10-CM

## 2023-12-11 DIAGNOSIS — F4310 Post-traumatic stress disorder, unspecified: Secondary | ICD-10-CM | POA: Diagnosis not present

## 2023-12-11 LAB — B12 AND FOLATE PANEL
Folate: 11.2 ng/mL (ref >5.9)
Vitamin B-12: >1537 pg/mL — ABNORMAL HIGH (ref 211–911)

## 2023-12-11 MED ORDER — ALPRAZOLAM 0.25 MG PO TABS: 0.2500 mg | ORAL_TABLET | ORAL | 1 refills | Status: AC | PRN

## 2023-12-11 MED ORDER — PAROXETINE HCL ER 25 MG PO TB24: 25.0000 mg | ORAL_TABLET | Freq: Every day | ORAL | 5 refills | Status: DC

## 2023-12-11 MED ORDER — TRIAMCINOLONE ACETONIDE 0.1 % EX CREA: 1.0000 | TOPICAL_CREAM | Freq: Two times a day (BID) | CUTANEOUS | 1 refills | Status: AC
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
## 2023-12-11 NOTE — Patient Instructions (Addendum)
 I am changing paxil  to 25 mg of the CR dose for better coverage .  We can increase we at one month if not helpful .  I strongly recommend taking advantage of the city's free Employee counselling for  your PTSD   I have refilled the alprazolam   to use for panic attacks, or for  insomnia that does not respond  to relaxium trial(see below)     I recommend you  try using Relaxium for insomnia  (as seen on TV commercials) . It is available through [employer] and contains all natural supplements:  Melatonin 5 mg  Chamomile 25 mg Passionflower extract 75 mg GABA 100 mg Ashwaganda extract 125 mg Magnesium citrate, glycinate, oxide (100 mg)  L tryptophan 500 mg Valerest (proprietary  ingredient ; probably valeria root extract)    For the dry skin:  Eucerin  is the best otc lotion  to use on your hands.  Use this twice daily ,  followed by  triamcinolone   cream.  and wear gloves overnight  Avoid zyrtec D:  it   will  make the heart race .  Use zyrtec plus afrin spray (for 5 days max) if sinuses are congested

## 2023-12-12 LAB — LIPID PANEL W/REFLEX DIRECT LDL
Cholesterol: 141 mg/dL (ref <200)
HDL: 53 mg/dL (ref >=40)
LDL Cholesterol (Calc): 72 mg/dL
Non-HDL Cholesterol (Calc): 88 mg/dL (ref <130)
Total CHOL/HDL Ratio: 2.7 (calc) (ref <5.0)
Triglycerides: 76 mg/dL (ref <150)

## 2023-12-12 NOTE — Assessment & Plan Note (Signed)
 His dizziness has resolved with  B12 supplementation   Lab Results  Component Value Date   VITAMINB12 >1537 (H) 12/11/2023
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
## 2023-12-13 ENCOUNTER — Encounter: Payer: Self-pay | Admitting: Internal Medicine

## 2024-01-02 ENCOUNTER — Other Ambulatory Visit: Payer: Self-pay | Admitting: Internal Medicine

## 2024-01-12 ENCOUNTER — Encounter: Payer: 59 | Admitting: Internal Medicine

## 2024-01-27 ENCOUNTER — Ambulatory Visit (INDEPENDENT_AMBULATORY_CARE_PROVIDER_SITE_OTHER): Payer: 59 | Admitting: Internal Medicine

## 2024-01-27 ENCOUNTER — Encounter: Payer: 59 | Admitting: Internal Medicine

## 2024-01-27 ENCOUNTER — Encounter: Payer: Self-pay | Admitting: Internal Medicine

## 2024-01-27 VITALS — BP 100/68 | HR 65 | Ht 74.0 in | Wt 169.2 lb

## 2024-01-27 DIAGNOSIS — Z0000 Encounter for general adult medical examination without abnormal findings: Secondary | ICD-10-CM

## 2024-01-27 DIAGNOSIS — F4310 Post-traumatic stress disorder, unspecified: Secondary | ICD-10-CM

## 2024-01-27 MED ORDER — PAROXETINE HCL ER 37.5 MG PO TB24: 37.5000 mg | ORAL_TABLET | Freq: Every day | ORAL | 0 refills | Status: DC
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
## 2024-01-27 NOTE — Patient Instructions (Addendum)
 Good to see you!  For the allergies:  start now.  Things are starting to bloom.  You can combine zyrtec every 12 hours along with  once daily singulair   You can also add flonase or Nasonex (they are both  steroid nasal  sprays that are available OTC )  once or twice daily (follow the instructions on the bottle)  2) For the PTSD:     We are increasing the paxil to 37.5 mg daily as a 30 day trial

## 2024-01-28 NOTE — Assessment & Plan Note (Signed)

## 2024-02-12 ENCOUNTER — Encounter: Payer: 59 | Admitting: Internal Medicine

## 2024-02-15 ENCOUNTER — Encounter: Payer: Self-pay | Admitting: Internal Medicine

## 2024-02-15 MED ORDER — MONTELUKAST SODIUM 10 MG PO TABS: 10.0000 mg | ORAL_TABLET | Freq: Every day | ORAL | 3 refills | Status: AC

## 2024-02-19 ENCOUNTER — Other Ambulatory Visit: Payer: Self-pay | Admitting: Internal Medicine

## 2024-02-23 ENCOUNTER — Encounter: Payer: 59 | Admitting: Internal Medicine

## 2024-02-23 ENCOUNTER — Encounter: Payer: Self-pay | Admitting: Internal Medicine

## 2024-02-24 MED ORDER — PAROXETINE HCL ER 37.5 MG PO TB24: 37.5000 mg | ORAL_TABLET | Freq: Every day | ORAL | 1 refills | Status: DC

## 2024-03-07 ENCOUNTER — Encounter: Payer: Self-pay | Admitting: Internal Medicine

## 2024-03-11 ENCOUNTER — Ambulatory Visit (INDEPENDENT_AMBULATORY_CARE_PROVIDER_SITE_OTHER): Payer: 59 | Admitting: Internal Medicine

## 2024-03-11 ENCOUNTER — Encounter: Payer: Self-pay | Admitting: Internal Medicine

## 2024-03-11 VITALS — BP 120/72 | HR 86 | Ht 74.0 in | Wt 176.2 lb

## 2024-03-11 DIAGNOSIS — F4310 Post-traumatic stress disorder, unspecified: Secondary | ICD-10-CM

## 2024-03-11 DIAGNOSIS — J301 Allergic rhinitis due to pollen: Secondary | ICD-10-CM | POA: Diagnosis not present
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
## 2024-03-11 NOTE — Patient Instructions (Addendum)
 For your allergies/pollen season:  1) Add one of the antihistamines below at night to complement the singulair (zyrtec allegra and claritin):   generic zyrtec, which is cetirizine.  ([employer] has 365 tablets for $15)  Allegra is available generically as fexofenadine.   claritin is also available generically as loratidine .     2) if still stuffy, add flonase (steroid nasal spray)   3) Also very important to flush your sinuses after outside activities with salt water (Neilmed rinse,  [place] or [place]).       You can use afrin (oxyzmetazolone) nasal spray every 12 hours as needed for congestion,  but only for 3 days   then stop the afrin   Continue current paxil dose  follow up in 6 months

## 2024-03-12 NOTE — Assessment & Plan Note (Signed)
 Recommend adding anthistamine and/or steroid nasal spray to Singulair

## 2024-03-15 DIAGNOSIS — G4733 Obstructive sleep apnea (adult) (pediatric): Secondary | ICD-10-CM | POA: Diagnosis not present

## 2024-06-01 ENCOUNTER — Ambulatory Visit: Admitting: Internal Medicine

## 2024-06-13 DIAGNOSIS — G4733 Obstructive sleep apnea (adult) (pediatric): Secondary | ICD-10-CM | POA: Diagnosis not present

## 2024-07-08 ENCOUNTER — Encounter: Payer: Self-pay | Admitting: Nurse Practitioner

## 2024-07-08 ENCOUNTER — Ambulatory Visit: Attending: Nurse Practitioner | Admitting: Nurse Practitioner

## 2024-07-08 NOTE — Progress Notes (Deleted)
 Office Visit    Patient Name: Christian Montgomery Date of Encounter: 07/08/2024  Primary Care Provider:  Marylynn Verneita LITTIE, MD Primary Cardiologist:  Redell Cave, MD    Chief Complaint    33 y.o. male with a history of anxiety, GERD, dysautonomia, POTS, and palpitations, who presents for follow-up related to chest pain and cardiomyopathy.   Past Medical History   Subjective   Past Medical History:  Diagnosis Date   Anxiety    Cardiomyopathy (HCC)    a. 08/2023 Echo: EF 45-50%, glob HK, mildly enlarged RV, no significant valvular disease; b. 11/2023 cMRI: EF 57%, no LGE/scar.  No myocardial infiltration.  Normal RV size and function.  No significant valvular abnormalities.   Chest pain    a. 09/2023 ETT: Ex time - 13:31 , max HR - 162, 13.4 METS, No acute ST/T changes->low risk study.   Dysautonomia/POTS 09/06/2013   Gastric ulcer with obstruction 03/2012   hosp [facility]   GERD (gastroesophageal reflux disease)    Bertrum syndrome    IBS (irritable bowel syndrome)    Palpitations    a. 06/2023 Zio: trouble w/ adhesion - 2 monitors over ~ 7 days - avg HR 70's. Rare PACs/PVCs. No sustained arrhythmias/triggered events.   POTS (postural orthostatic tachycardia syndrome)    Tachycardia    Past Surgical History:  Procedure Laterality Date   COLONOSCOPY     COSMETIC SURGERY     NASAL SEPTOPLASTY W/ TURBINOPLASTY Bilateral 12/25/2020   Procedure: NASAL SEPTOPLASTY WITH TURBINATE REDUCTION;  Surgeon: Blair Mt, MD;  Location: [facility];  Service: ENT;  Laterality: Bilateral;    Allergies  Allergies  Allergen Reactions   Milk-Related Compounds     GI upset       History of Present Illness      33 y.o. y/o male with a history of anxiety, GERD, dysautonomia, POTS, and palpitations.  He was originally seen as a 33 year old in 2014 with complaints of dizziness and palpitations.  An event monitor was placed and he was found to have sinus tachycardia  associated with prolonged standing.   He was referred to electrophysiology and diagnosed with dysautonomia and POTS.  Beta-blocker was discontinued.  He was subsequently lost to follow-up and reestablished care with Dr. Cave in July 2024 due to worsening palpitations over the preceding 6 months.  Event monitoring over was ordered however due to difficulty with adhesion, over the course of 2 separate monitors, only about 7 days were completed.  Average heart rates were in the 70s without any significant arrhythmias.  An echo was also carried out in September 2024, and showed mildly reduced LV function with an EF of 45 to 50%, global hypokinesis, and no significant valvular disease.  He complained of precordial chest pain at office follow-up in October 2024, and subsequent exercise treadmill test showed excellent exercise tolerance, walking 13 minutes and 31 seconds, without acute ST or T changes.  Cardiac MRI was ordered in the setting of cardiomyopathy and was performed in December 2024, showing an EF of 57% with normal RV size and function, no LGE/scar or evidence of myocardial infiltrative disease.     Since his last visit in November 2024, Mr. Probert Objective   Home Medications    Current Outpatient Medications  Medication Sig Dispense Refill   ALPRAZolam  (XANAX ) 0.25 MG tablet Take 1 tablet (0.25 mg total) by mouth as needed for anxiety (panic attacks). 30 tablet 1   montelukast  (SINGULAIR )  10 MG tablet Take 1 tablet (10 mg total) by mouth at bedtime. 90 tablet 3   Multiple Vitamins-Minerals (MULTIVITAMIN ADULTS PO) Take by mouth.     pantoprazole  (PROTONIX ) 40 MG tablet Take 1 tablet (40 mg total) by mouth daily. 90 tablet 2   PARoxetine  (PAXIL -CR) 37.5 MG 24 hr tablet Take 1 tablet (37.5 mg total) by mouth daily. 90 tablet 1   triamcinolone  cream (KENALOG ) 0.1 % Apply 1 Application topically 2 (two) times daily. 80 g 1   No current facility-administered medications for this visit.      Physical Exam    VS:  There were no vitals taken for this visit. , BMI There is no height or weight on file to calculate BMI.          GEN: Well nourished, well developed, in no acute distress. HEENT: normal. Neck: Supple, no JVD, carotid bruits, or masses. Cardiac: RRR, no murmurs, rubs, or gallops. No clubbing, cyanosis, edema.  Radials 2+/PT 2+ and equal bilaterally.  Respiratory:  Respirations regular and unlabored, clear to auscultation bilaterally. GI: Soft, nontender, nondistended, BS + x 4. MS: no deformity or atrophy. Skin: warm and dry, no rash. Neuro:  Strength and sensation are intact. Psych: Normal affect.  Accessory Clinical Findings    ECG personally reviewed by me today -    *** - no acute changes.  Lab Results  Component Value Date   WBC 4.5 01/16/2023   HGB 14.5 10/05/2023   HCT 43.4 10/05/2023   MCV 87.4 01/16/2023   PLT 211 01/16/2023   Lab Results  Component Value Date   CREATININE 1.14 10/05/2023   BUN 8 10/05/2023   NA 140 10/05/2023   K 5.0 10/05/2023   CL 101 10/05/2023   CO2 25 10/05/2023   Lab Results  Component Value Date   ALT 11 08/28/2023   AST 16 08/28/2023   ALKPHOS 49 01/05/2023   BILITOT 2.7 (H) 08/28/2023   Lab Results  Component Value Date   CHOL 141 12/11/2023   HDL 53 12/11/2023   LDLCALC 72 12/11/2023   TRIG 76 12/11/2023   CHOLHDL 2.7 12/11/2023    Lab Results  Component Value Date   HGBA1C 5.5 01/05/2023   Lab Results  Component Value Date   TSH 0.97 08/28/2023       Assessment & Plan    1.  ***  Lonni Meager, NP 07/08/2024, 1:39 PM

## 2024-08-15 ENCOUNTER — Encounter: Payer: Self-pay | Admitting: Internal Medicine

## 2024-08-15 ENCOUNTER — Ambulatory Visit: Admitting: Internal Medicine

## 2024-08-15 ENCOUNTER — Ambulatory Visit (INDEPENDENT_AMBULATORY_CARE_PROVIDER_SITE_OTHER): Admitting: Internal Medicine

## 2024-08-15 VITALS — BP 128/82 | HR 67 | Ht 74.0 in | Wt 187.2 lb

## 2024-08-15 DIAGNOSIS — R5383 Other fatigue: Secondary | ICD-10-CM | POA: Diagnosis not present

## 2024-08-15 DIAGNOSIS — F4310 Post-traumatic stress disorder, unspecified: Secondary | ICD-10-CM | POA: Diagnosis not present

## 2024-08-15 DIAGNOSIS — R7301 Impaired fasting glucose: Secondary | ICD-10-CM | POA: Diagnosis not present

## 2024-08-15 DIAGNOSIS — E7800 Pure hypercholesterolemia, unspecified: Secondary | ICD-10-CM | POA: Diagnosis not present

## 2024-08-15 DIAGNOSIS — E538 Deficiency of other specified B group vitamins: Secondary | ICD-10-CM | POA: Diagnosis not present

## 2024-08-15 LAB — COMPREHENSIVE METABOLIC PANEL WITH GFR
ALT: 24 U/L (ref 0–53)
AST: 18 U/L (ref 0–37)
Albumin: 4.9 g/dL (ref 3.5–5.2)
Alkaline Phosphatase: 58 U/L (ref 39–117)
BUN: 9 mg/dL (ref 6–23)
CO2: 30 meq/L (ref 19–32)
Calcium: 9.3 mg/dL (ref 8.4–10.5)
Chloride: 102 meq/L (ref 96–112)
Creatinine, Ser: 0.85 mg/dL (ref 0.40–1.50)
GFR: 112.44 mL/min (ref >60.00)
Glucose, Bld: 83 mg/dL (ref 70–99)
Potassium: 4.2 meq/L (ref 3.5–5.1)
Sodium: 138 meq/L (ref 135–145)
Total Bilirubin: 1.8 mg/dL — ABNORMAL HIGH (ref 0.2–1.2)
Total Protein: 7 g/dL (ref 6.0–8.3)

## 2024-08-15 LAB — CBC WITH DIFFERENTIAL/PLATELET
Basophils Absolute: 0 K/uL (ref 0.0–0.1)
Basophils Relative: 0.7 % (ref 0.0–3.0)
Eosinophils Absolute: 0.1 K/uL (ref 0.0–0.7)
Eosinophils Relative: 1.7 % (ref 0.0–5.0)
HCT: 46.4 % (ref 39.0–52.0)
Hemoglobin: 15.3 g/dL (ref 13.0–17.0)
Lymphocytes Relative: 24 % (ref 12.0–46.0)
Lymphs Abs: 0.9 K/uL (ref 0.7–4.0)
MCHC: 33 g/dL (ref 30.0–36.0)
MCV: 91.4 fl (ref 78.0–100.0)
Monocytes Absolute: 0.3 K/uL (ref 0.1–1.0)
Monocytes Relative: 9.3 % (ref 3.0–12.0)
Neutro Abs: 2.4 K/uL (ref 1.4–7.7)
Neutrophils Relative %: 64.3 % (ref 43.0–77.0)
Platelets: 192 K/uL (ref 150.0–400.0)
RBC: 5.08 Mil/uL (ref 4.22–5.81)
RDW: 12.7 % (ref 11.5–15.5)
WBC: 3.7 K/uL — ABNORMAL LOW (ref 4.0–10.5)

## 2024-08-15 LAB — LDL CHOLESTEROL, DIRECT: Direct LDL: 102 mg/dL

## 2024-08-15 LAB — LIPID PANEL
Cholesterol: 162 mg/dL (ref 0–200)
HDL: 49 mg/dL (ref >39.00)
LDL Cholesterol: 95 mg/dL (ref 0–99)
NonHDL: 113.27
Total CHOL/HDL Ratio: 3
Triglycerides: 92 mg/dL (ref 0.0–149.0)
VLDL: 18.4 mg/dL (ref 0.0–40.0)

## 2024-08-15 LAB — TSH: TSH: 1.91 u[IU]/mL (ref 0.35–5.50)

## 2024-08-15 MED ORDER — PANTOPRAZOLE SODIUM 40 MG PO TBEC: 40.0000 mg | DELAYED_RELEASE_TABLET | Freq: Every day | ORAL | 2 refills | Status: AC

## 2024-08-15 MED ORDER — ARIPIPRAZOLE 2 MG PO TABS: 2.0000 mg | ORAL_TABLET | Freq: Every day | ORAL | 2 refills | Status: DC

## 2024-08-15 MED ORDER — PAROXETINE HCL ER 37.5 MG PO TB24: 37.5000 mg | ORAL_TABLET | Freq: Every day | ORAL | 1 refills | Status: AC

## 2024-08-15 NOTE — Patient Instructions (Signed)
 Increase water intake and add citrucel or benefiber to help move bowels   Continue paxil   at current dose and adding Abilify  at starting dose to hlep the depression sympotms    Referral to psychology to be made today
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
## 2024-08-15 NOTE — Progress Notes (Unsigned)
 Subjective:  Patient ID: Christian Montgomery, male    DOB: 11/08/1991  Age: 33 y.o. MRN: [id number]  CC: The primary encounter diagnosis was B12 deficiency. Diagnoses of Pure hypercholesterolemia, Impaired fasting glucose, and Other fatigue were also pertinent to this visit.   HPI Christian Montgomery presents for  Chief Complaint  Patient presents with   Medical Management of Chronic Issues    6 month follow up       Outpatient Medications Prior to Visit  Medication Sig Dispense Refill   ALPRAZolam  (XANAX ) 0.25 MG tablet Take 1 tablet (0.25 mg total) by mouth as needed for anxiety (panic attacks). 30 tablet 1   montelukast  (SINGULAIR ) 10 MG tablet Take 1 tablet (10 mg total) by mouth at bedtime. 90 tablet 3   Multiple Vitamins-Minerals (MULTIVITAMIN ADULTS PO) Take by mouth.     pantoprazole  (PROTONIX ) 40 MG tablet Take 1 tablet (40 mg total) by mouth daily. 90 tablet 2   PARoxetine  (PAXIL -CR) 37.5 MG 24 hr tablet Take 1 tablet (37.5 mg total) by mouth daily. 90 tablet 1   triamcinolone  cream (KENALOG ) 0.1 % Apply 1 Application topically 2 (two) times daily. 80 g 1   No facility-administered medications prior to visit.    Review of Systems;  Patient denies headache, fevers, malaise, unintentional weight loss, skin rash, eye pain, sinus congestion and sinus pain, sore throat, dysphagia,  hemoptysis , cough, dyspnea, wheezing, chest pain, palpitations, orthopnea, edema, abdominal pain, nausea, melena, diarrhea, constipation, flank pain, dysuria, hematuria, urinary  Frequency, nocturia, numbness, tingling, seizures,  Focal weakness, Loss of consciousness,  Tremor, insomnia, depression, anxiety, and suicidal ideation.      Objective:  BP 128/82   Pulse 67   Ht 6' 2 (1.88 m)   Wt 187 lb 3.2 oz (84.9 kg)   SpO2 95%   BMI 24.04 kg/m   BP Readings from Last 3 Encounters:  08/15/24 128/82  03/11/24 120/72  01/27/24 100/68    Wt Readings from Last 3 Encounters:  08/15/24 187 lb 3.2 oz  (84.9 kg)  03/11/24 176 lb 3.2 oz (79.9 kg)  01/27/24 169 lb 3.2 oz (76.7 kg)    Physical Exam  Lab Results  Component Value Date   HGBA1C 5.5 01/05/2023   HGBA1C 5.5 01/06/2022   HGBA1C 5.4 05/27/2013    Lab Results  Component Value Date   CREATININE 1.14 10/05/2023   CREATININE 0.96 08/28/2023   CREATININE 1.04 01/16/2023    Lab Results  Component Value Date   WBC 4.5 01/16/2023   HGB 14.5 10/05/2023   HCT 43.4 10/05/2023   PLT 211 01/16/2023   GLUCOSE 92 10/05/2023   CHOL 141 12/11/2023   TRIG 76 12/11/2023   HDL 53 12/11/2023   LDLCALC 72 12/11/2023   ALT 11 08/28/2023   AST 16 08/28/2023   NA 140 10/05/2023   K 5.0 10/05/2023   CL 101 10/05/2023   CREATININE 1.14 10/05/2023   BUN 8 10/05/2023   CO2 25 10/05/2023   TSH 0.97 08/28/2023   PSA 1.41 01/16/2023   INR 1.3 (H) 02/24/2014   HGBA1C 5.5 01/05/2023    MR CARDIAC MORPHOLOGY W WO CONTRAST Result Date: 11/11/2023 CLINICAL DATA:  Reduced EF EXAM: CARDIAC MRI TECHNIQUE: The patient was scanned on a 1.5 Tesla Siemens magnet. A dedicated cardiac coil was used. Functional imaging was done using Fiesta sequences. 2,3, and 4 chamber views were done to assess for RWMA's. Modified Simpson's rule using a short axis stack was  used to calculate an ejection fraction on a dedicated work [employer]. The patient received 10 cc of Gadavist . After 10 minutes inversion recovery sequences were used to assess for infiltration and scar tissue. Velocity flow mapping performed in the ascending aorta and main pulmonary artery. CONTRAST:  10 cc  of Gadavist  FINDINGS: 1. Normal left ventricular size, thickness and systolic function (LVEF = 57%). There are no regional wall motion abnormalities. There is no late gadolinium enhancement in the left ventricular myocardium. LVEDV: 181 ml LVESV: 76 ml SV: 105 ml CO: 5.9 L/min Myocardial mass: 88 g LV Native T1 843 ms (normal <1036ms) LV ECV value 25% (normal <30%) 2. Normal  right ventricular size, thickness and systolic function (RVEF = 52%). There are no regional wall motion abnormalities. 3.  Normal left and right atrial size. 4. Normal size of the aortic root, ascending aorta and pulmonary artery. 5.  No significant valvular abnormalities. 6.  Normal pericardium.  No pericardial effusion. IMPRESSION: 1.  Normal LV size and systolic function.  LVEF 57%. 2.  No LGE or scar. 3.  No evidence for myocardial infiltration. 4.  Normal RV size and function. 5.  No significant valvular abnormalities Electronically Signed   By: Redell Cave M.D.   On: 11/11/2023 14:04   MR CARDIAC VELOCITY FLOW MAP Result Date: 11/11/2023 CLINICAL DATA:  Reduced EF EXAM: CARDIAC MRI TECHNIQUE: The patient was scanned on a 1.5 Tesla Siemens magnet. A dedicated cardiac coil was used. Functional imaging was done using Fiesta sequences. 2,3, and 4 chamber views were done to assess for RWMA's. Modified Simpson's rule using a short axis stack was used to calculate an ejection fraction on a dedicated work [employer] officer, [employer]. The patient received 10 cc of Gadavist . After 10 minutes inversion recovery sequences were used to assess for infiltration and scar tissue. Velocity flow mapping performed in the ascending aorta and main pulmonary artery. CONTRAST:  10 cc  of Gadavist  FINDINGS: 1. Normal left ventricular size, thickness and systolic function (LVEF = 57%). There are no regional wall motion abnormalities. There is no late gadolinium enhancement in the left ventricular myocardium. LVEDV: 181 ml LVESV: 76 ml SV: 105 ml CO: 5.9 L/min Myocardial mass: 88 g LV Native T1 843 ms (normal <109ms) LV ECV value 25% (normal <30%) 2. Normal right ventricular size, thickness and systolic function (RVEF = 52%). There are no regional wall motion abnormalities. 3.  Normal left and right atrial size. 4. Normal size of the aortic root, ascending aorta and pulmonary artery. 5.  No significant valvular  abnormalities. 6.  Normal pericardium.  No pericardial effusion. IMPRESSION: 1.  Normal LV size and systolic function.  LVEF 57%. 2.  No LGE or scar. 3.  No evidence for myocardial infiltration. 4.  Normal RV size and function. 5.  No significant valvular abnormalities Electronically Signed   By: Redell Cave M.D.   On: 11/11/2023 14:04   MR CARDIAC VELOCITY FLOW MAP Result Date: 11/11/2023 CLINICAL DATA:  Reduced EF EXAM: CARDIAC MRI TECHNIQUE: The patient was scanned on a 1.5 Tesla Siemens magnet. A dedicated cardiac coil was used. Functional imaging was done using Fiesta sequences. 2,3, and 4 chamber views were done to assess for RWMA's. Modified Simpson's rule using a short axis stack was used to calculate an ejection fraction on a dedicated work [employer] officer, trade [employer]. The patient received 10 cc of Gadavist . After 10 minutes inversion recovery sequences were used to assess for infiltration and  scar tissue. Velocity flow mapping performed in the ascending aorta and main pulmonary artery. CONTRAST:  10 cc  of Gadavist  FINDINGS: 1. Normal left ventricular size, thickness and systolic function (LVEF = 57%). There are no regional wall motion abnormalities. There is no late gadolinium enhancement in the left ventricular myocardium. LVEDV: 181 ml LVESV: 76 ml SV: 105 ml CO: 5.9 L/min Myocardial mass: 88 g LV Native T1 843 ms (normal <1027ms) LV ECV value 25% (normal <30%) 2. Normal right ventricular size, thickness and systolic function (RVEF = 52%). There are no regional wall motion abnormalities. 3.  Normal left and right atrial size. 4. Normal size of the aortic root, ascending aorta and pulmonary artery. 5.  No significant valvular abnormalities. 6.  Normal pericardium.  No pericardial effusion. IMPRESSION: 1.  Normal LV size and systolic function.  LVEF 57%. 2.  No LGE or scar. 3.  No evidence for myocardial infiltration. 4.  Normal RV size and function. 5.  No significant valvular abnormalities  Electronically Signed   By: Redell Cave M.D.   On: 11/11/2023 14:04    Assessment & Plan:  .B12 deficiency  Pure hypercholesterolemia  Impaired fasting glucose  Other fatigue     I spent 34 minutes on the day of this face to face encounter reviewing patient's  most recent visit with cardiology,  nephrology,  and neurology,  prior relevant surgical and non surgical procedures, recent  labs and imaging studies, counseling on weight management,  reviewing the assessment and plan with patient, and post visit ordering and reviewing of  diagnostics and therapeutics with patient  .   Follow-up: No follow-ups on file.   Verneita LITTIE Kettering, MD

## 2024-08-17 ENCOUNTER — Ambulatory Visit: Payer: Self-pay | Admitting: Internal Medicine

## 2024-09-01 ENCOUNTER — Encounter

## 2024-09-06 ENCOUNTER — Other Ambulatory Visit: Payer: Self-pay | Admitting: Internal Medicine

## 2024-09-09 ENCOUNTER — Ambulatory Visit: Admitting: Internal Medicine

## 2024-09-09 ENCOUNTER — Encounter: Payer: Self-pay | Admitting: Internal Medicine

## 2024-09-09 ENCOUNTER — Ambulatory Visit: Admitting: Licensed Clinical Social Worker

## 2024-09-12 ENCOUNTER — Ambulatory Visit: Admitting: Internal Medicine

## 2024-09-12 ENCOUNTER — Encounter: Payer: Self-pay | Admitting: Internal Medicine

## 2024-09-12 VITALS — BP 126/72 | HR 102 | Ht 74.0 in | Wt 190.4 lb

## 2024-09-12 DIAGNOSIS — F4310 Post-traumatic stress disorder, unspecified: Secondary | ICD-10-CM | POA: Diagnosis not present

## 2024-09-12 MED ORDER — TRAZODONE HCL 50 MG PO TABS: 25.0000 mg | ORAL_TABLET | Freq: Every evening | ORAL | 1 refills | Status: AC | PRN

## 2024-09-12 NOTE — Patient Instructions (Signed)
 Continue Paxil  and abilify   Save the alprazolam  for PANIC ATTACKS  I have prescribed trazodone to help you rest at night. . It is an old antidepressant that helps primarily with insomnia  Start with 1/2 tablet at bedtime, or an hour before.   You may increase the dose to a full tablet (50 mg ) after a  few days,  if you are still having trouble going back to sleep . The maximal dose is 100 mg , and is usually associated with morning grogginess,  so if you increase the dose to 100 mg you will need to take it by 8 pm
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
## 2024-09-13 NOTE — Assessment & Plan Note (Signed)
 Daytine anxiety managed well with paxil  and abilify . Adding trazodone for insomnia; starting dose 25 mg ,  prazosin considered but side effects considered to be too significant.

## 2024-09-30 ENCOUNTER — Ambulatory Visit (INDEPENDENT_AMBULATORY_CARE_PROVIDER_SITE_OTHER): Payer: Self-pay | Admitting: Licensed Clinical Social Worker

## 2024-09-30 DIAGNOSIS — Z0389 Encounter for observation for other suspected diseases and conditions ruled out: Secondary | ICD-10-CM

## 2024-09-30 NOTE — Progress Notes (Deleted)
 [facility] Counselor/Therapist Progress Note  Patient ID: RECE ZECHMAN, MRN: [id number]    Date: 09/30/24  Time Spent: ***  {LBBHAMPM:26719} - *** {LBBHAMPM:26719} : *** Minutes  Treatment Type: Individual Therapy.  Reported Symptoms: ***  Mental Status Exam: Appearance:  {PSY:22683}     Behavior: {PSY:21022743}  Motor: {PSY:22302}  Speech/Language:  {PSY:22685}  Affect: {PSY:22687}  Mood: {PSY:31886}  Thought process: {PSY:31888}  Thought content:   {PSY:[phone]}  Sensory/Perceptual disturbances:   {[phone]}  Orientation: {PSY:30297}  Attention: {PSY:22877}  Concentration: {PSY:917-394-6004}  Memory: {PSY:213-387-6473}  Fund of knowledge:  {PSY:917-394-6004}  Insight:   {PSY:917-394-6004}  Judgment:  {PSY:917-394-6004}  Impulse Control: {PSY:917-394-6004}   Risk Assessment: Danger to Self:  {PSY:22692} Self-injurious Behavior: {PSY:22692} Danger to Others: {PSY:22692} Duty to Warn:{PSY:311194} Physical Aggression / Violence:{PSY:21197} Access to Firearms a concern: {PSY:21197} Gang Involvement:{PSY:21197}  Subjective:   Christopher LITTIE Sharps participated from {Patient Location:[address]::home}, via {LBBHVIDEOORPHONE:26720}, and consented to treatment. Therapist participated from {LBBHPROVIDERLOCATION:26721}. We met online due to COVID pandemic.   ***   Interventions: {[phone]}  Diagnosis: No diagnosis found.   Plan: ***Patient is to use CBT, mindfulness and coping skills to help manage decrease symptoms associated with their diagnosis.   Long-term goal:   ***Reduce overall level, frequency, and intensity of the feelings of depression, anxiety and panic evidenced by       decreased irritability, negative self talk, and helpless feelings from 6 to 7 days/week to 0 to 1 days/week per client report for at least 3 consecutive months.  Short-term goal:  ***Verbally express understanding of the relationship between feelings of depression, anxiety and their  impact on thinking patterns and behaviors. Verbalize an understanding of the role that distorted thinking plays in creating fears, excessive worry, and ruminations.  Damien Junk MSW, LCSW/DATE

## 2024-10-19 NOTE — Progress Notes (Signed)
 [facility] Counselor/Therapist Progress Note  Patient ID: Christian Montgomery, MRN: [id number]    Date: 09/30/2024    Damien Junk MSW, LCSW/DATE 09/30/2024

## 2024-11-09 ENCOUNTER — Ambulatory Visit: Admitting: Internal Medicine

## 2024-11-09 ENCOUNTER — Encounter: Payer: Self-pay | Admitting: Internal Medicine

## 2024-11-09 VITALS — BP 110/76 | HR 101 | Temp 97.6°F | Wt 193.4 lb

## 2024-11-09 DIAGNOSIS — Z23 Encounter for immunization: Secondary | ICD-10-CM

## 2024-11-09 DIAGNOSIS — K760 Fatty (change of) liver, not elsewhere classified: Secondary | ICD-10-CM

## 2024-11-09 DIAGNOSIS — R5383 Other fatigue: Secondary | ICD-10-CM

## 2024-11-09 DIAGNOSIS — R000 Tachycardia, unspecified: Secondary | ICD-10-CM

## 2024-11-09 DIAGNOSIS — F4310 Post-traumatic stress disorder, unspecified: Secondary | ICD-10-CM

## 2024-11-09 NOTE — Patient Instructions (Addendum)
 Take the paxil  and abilify  with your wakeup meal  (around  4 pm)   Take the trazodone  one hour before you lie down to sleep   Do not plan to lie down until meal has been over for at least 2 HOURS (to x) avoid reflux  [email or web address]   check it out   YOUR WIFE IS CORRECT!  YOU NEED TO REDUCE YOUR SUGARY DRINKS  TO 1-2 DAILY

## 2024-11-09 NOTE — Progress Notes (Signed)
 Subjective:  Patient ID: Christian Montgomery, male    DOB: 05-29-1991  Age: 33 y.o. MRN: [id number]  CC: The primary encounter diagnosis was Other fatigue. Diagnoses of Tachycardia, Need for influenza vaccination, Nonalcoholic fatty liver disease without nonalcoholic steatohepatitis (NASH), and PTSD (post-traumatic stress disorder) were also pertinent to this visit.   HPI [name] presents for  Chief Complaint  Patient presents with   Medical Management of Chronic Issues   PTSD :  seen  in October ;  trazodone  added for insomnia.  Daytime anxiety improved with paxil  and abilify   . Had appt in late October for counselling with MSW but did not keep it because he was fired from his job because of missed work and lost his insurance for a month.  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 Work hours  are now 7 pm to 6:30 am,   tries to sleep from 7:15 am to 12:30 .  Waking up early  because he's not sure when to take the  trazodone      Outpatient Medications Prior to Visit  Medication Sig Dispense Refill   ALPRAZolam  (XANAX ) 0.25 MG tablet Take 1 tablet (0.25 mg total) by mouth as needed for anxiety (panic attacks). 30 tablet 1   ARIPiprazole  (ABILIFY ) 2 MG tablet TAKE 1 TABLET BY MOUTH EVERY DAY 90 tablet 1   montelukast  (SINGULAIR ) 10 MG tablet Take 1 tablet (10 mg total) by mouth at bedtime. 90 tablet 3   Multiple Vitamins-Minerals (MULTIVITAMIN ADULTS PO) Take by mouth.     pantoprazole  (PROTONIX ) 40 MG tablet Take 1 tablet (40 mg total) by mouth daily. 90 tablet 2   PARoxetine  (PAXIL -CR) 37.5 MG 24 hr tablet Take 1 tablet (37.5 mg total) by mouth daily. 90 tablet 1   traZODone  (DESYREL ) 50 MG tablet Take 0.5-1 tablets (25-50 mg total) by mouth at bedtime as needed for sleep. 90 tablet 1   triamcinolone  cream (KENALOG ) 0.1 % Apply 1 Application topically 2 (two) times daily. 80 g 1   No facility-administered  medications prior to visit.    Review of Systems;  Patient denies headache, fevers, malaise, unintentional weight loss, skin rash, eye pain, sinus congestion and sinus pain, sore throat, dysphagia,  hemoptysis , cough, dyspnea, wheezing, chest pain, palpitations, orthopnea, edema, abdominal pain, nausea, melena, diarrhea, constipation, flank pain, dysuria, hematuria, urinary  Frequency, nocturia, numbness, tingling, seizures,  Focal weakness, Loss of consciousness,  Tremor, insomnia, depression, anxiety, and suicidal ideation.      Objective:  BP 110/76 (Patient Position: Sitting)   Pulse (!) 101   Temp 97.6 F (36.4 C)   Wt 193 lb 6.4 oz (87.7 kg)   SpO2 97%   BMI 24.83 kg/m   BP Readings from Last 3 Encounters:  11/09/24 110/76  09/12/24 126/72  08/15/24 128/82    Wt Readings from Last 3 Encounters:  11/09/24 193 lb 6.4 oz (87.7 kg)  09/12/24 190 lb 6.4 oz (86.4 kg)  08/15/24 187 lb 3.2 oz (84.9 kg)    Physical Exam Vitals reviewed.  Constitutional:      General: He is not in acute distress.    Appearance: Normal appearance. He is normal weight. He is not ill-appearing, toxic-appearing or diaphoretic.  HENT:     Head: Normocephalic.  Eyes:     General: No scleral icterus.       Right eye: No discharge.  Left eye: No discharge.     Conjunctiva/sclera: Conjunctivae normal.  Cardiovascular:     Rate and Rhythm: Normal rate and regular rhythm.     Heart sounds: Normal heart sounds.  Pulmonary:     Effort: Pulmonary effort is normal. No respiratory distress.     Breath sounds: Normal breath sounds.  Musculoskeletal:        General: Normal range of motion.     Cervical back: Normal range of motion.  Skin:    General: Skin is warm and dry.  Neurological:     General: No focal deficit present.     Mental Status: He is alert and oriented to person, place, and time. Mental status is at baseline.  Psychiatric:        Mood and Affect: Mood normal.         Behavior: Behavior normal.        Thought Content: Thought content normal.        Judgment: Judgment normal.     Lab Results  Component Value Date   HGBA1C 5.5 01/05/2023   HGBA1C 5.5 01/06/2022   HGBA1C 5.4 05/27/2013    Lab Results  Component Value Date   CREATININE 0.88 11/09/2024   CREATININE 0.85 08/15/2024   CREATININE 1.14 10/05/2023    Lab Results  Component Value Date   WBC 4.4 11/09/2024   HGB 15.0 11/09/2024   HCT 44.3 11/09/2024   PLT 199.0 11/09/2024   GLUCOSE 91 11/09/2024   CHOL 162 08/15/2024   TRIG 92.0 08/15/2024   HDL 49.00 08/15/2024   LDLDIRECT 102.0 08/15/2024   LDLCALC 95 08/15/2024   ALT 17 11/09/2024   AST 17 11/09/2024   NA 140 11/09/2024   K 3.7 11/09/2024   CL 103 11/09/2024   CREATININE 0.88 11/09/2024   BUN 15 11/09/2024   CO2 29 11/09/2024   TSH 1.16 11/09/2024   PSA 1.41 01/16/2023   INR 1.3 (H) 02/24/2014   HGBA1C 5.5 01/05/2023    MR CARDIAC MORPHOLOGY W WO CONTRAST Result Date: 11/11/2023 CLINICAL DATA:  Reduced EF EXAM: CARDIAC MRI TECHNIQUE: The patient was scanned on a 1.5 Tesla Siemens magnet. A dedicated cardiac coil was used. Functional imaging was done using Fiesta sequences. 2,3, and 4 chamber views were done to assess for RWMA's. Modified Simpson's rule using a short axis stack was used to calculate an ejection fraction on a dedicated work [employer]. The patient received 10 cc of Gadavist . After 10 minutes inversion recovery sequences were used to assess for infiltration and scar tissue. Velocity flow mapping performed in the ascending aorta and main pulmonary artery. CONTRAST:  10 cc  of Gadavist  FINDINGS: 1. Normal left ventricular size, thickness and systolic function (LVEF = 57%). There are no regional wall motion abnormalities. There is no late gadolinium enhancement in the left ventricular myocardium. LVEDV: 181 ml LVESV: 76 ml SV: 105 ml CO: 5.9 L/min Myocardial mass: 88 g LV Native T1 843 ms  (normal <1081ms) LV ECV value 25% (normal <30%) 2. Normal right ventricular size, thickness and systolic function (RVEF = 52%). There are no regional wall motion abnormalities. 3.  Normal left and right atrial size. 4. Normal size of the aortic root, ascending aorta and pulmonary artery. 5.  No significant valvular abnormalities. 6.  Normal pericardium.  No pericardial effusion. IMPRESSION: 1.  Normal LV size and systolic function.  LVEF 57%. 2.  No LGE or scar. 3.  No evidence for myocardial infiltration. 4.  Normal RV size and function. 5.  No significant valvular abnormalities Electronically Signed   By: Redell Cave M.D.   On: 11/11/2023 14:04   MR CARDIAC VELOCITY FLOW MAP Result Date: 11/11/2023 CLINICAL DATA:  Reduced EF EXAM: CARDIAC MRI TECHNIQUE: The patient was scanned on a 1.5 Tesla Siemens magnet. A dedicated cardiac coil was used. Functional imaging was done using Fiesta sequences. 2,3, and 4 chamber views were done to assess for RWMA's. Modified Simpson's rule using a short axis stack was used to calculate an ejection fraction on a dedicated work [employer]. The patient received 10 cc of Gadavist . After 10 minutes inversion recovery sequences were used to assess for infiltration and scar tissue. Velocity flow mapping performed in the ascending aorta and main pulmonary artery. CONTRAST:  10 cc  of Gadavist  FINDINGS: 1. Normal left ventricular size, thickness and systolic function (LVEF = 57%). There are no regional wall motion abnormalities. There is no late gadolinium enhancement in the left ventricular myocardium. LVEDV: 181 ml LVESV: 76 ml SV: 105 ml CO: 5.9 L/min Myocardial mass: 88 g LV Native T1 843 ms (normal <1042ms) LV ECV value 25% (normal <30%) 2. Normal right ventricular size, thickness and systolic function (RVEF = 52%). There are no regional wall motion abnormalities. 3.  Normal left and right atrial size. 4. Normal size of the aortic root, ascending aorta and  pulmonary artery. 5.  No significant valvular abnormalities. 6.  Normal pericardium.  No pericardial effusion. IMPRESSION: 1.  Normal LV size and systolic function.  LVEF 57%. 2.  No LGE or scar. 3.  No evidence for myocardial infiltration. 4.  Normal RV size and function. 5.  No significant valvular abnormalities Electronically Signed   By: Redell Cave M.D.   On: 11/11/2023 14:04   MR CARDIAC VELOCITY FLOW MAP Result Date: 11/11/2023 CLINICAL DATA:  Reduced EF EXAM: CARDIAC MRI TECHNIQUE: The patient was scanned on a 1.5 Tesla Siemens magnet. A dedicated cardiac coil was used. Functional imaging was done using Fiesta sequences. 2,3, and 4 chamber views were done to assess for RWMA's. Modified Simpson's rule using a short axis stack was used to calculate an ejection fraction on a dedicated work [employer]. The patient received 10 cc of Gadavist . After 10 minutes inversion recovery sequences were used to assess for infiltration and scar tissue. Velocity flow mapping performed in the ascending aorta and main pulmonary artery. CONTRAST:  10 cc  of Gadavist  FINDINGS: 1. Normal left ventricular size, thickness and systolic function (LVEF = 57%). There are no regional wall motion abnormalities. There is no late gadolinium enhancement in the left ventricular myocardium. LVEDV: 181 ml LVESV: 76 ml SV: 105 ml CO: 5.9 L/min Myocardial mass: 88 g LV Native T1 843 ms (normal <1061ms) LV ECV value 25% (normal <30%) 2. Normal right ventricular size, thickness and systolic function (RVEF = 52%). There are no regional wall motion abnormalities. 3.  Normal left and right atrial size. 4. Normal size of the aortic root, ascending aorta and pulmonary artery. 5.  No significant valvular abnormalities. 6.  Normal pericardium.  No pericardial effusion. IMPRESSION: 1.  Normal LV size and systolic function.  LVEF 57%. 2.  No LGE or scar. 3.  No evidence for myocardial infiltration. 4.  Normal RV size and  function. 5.  No significant valvular abnormalities Electronically Signed   By: Redell Cave M.D.   On: 11/11/2023 14:04    Assessment & Plan:  .Other fatigue -  IBC + Ferritin -     TSH -     CBC with Differential/Platelet -     B12 and Folate Panel  Tachycardia -     Comprehensive metabolic panel with GFR  Need for influenza vaccination -     Flu vaccine trivalent PF, 6mos and older(Flulaval,Afluria,Fluarix,Fluzone)  Nonalcoholic fatty liver disease without nonalcoholic steatohepatitis (NASH) Assessment & Plan: Repeat ultrasound was done recently and normal   Fibrosis 4 Score = .73 (Low risk)        Interpretation for patients with NAFLD          <1.30       -  F0-F1 (Low risk)          1.30-2.67 -  Indeterminate           >2.67      -  F3-F4 (High risk)     Validated for ages 39-65          PTSD (post-traumatic stress disorder) Assessment & Plan: Daytine anxiety managed well with paxil  and abilify . Sleeping better wit addition of  trazodone  for insomnia; no changes today     Follow-up: No follow-ups on file.   Verneita LITTIE Kettering, MD

## 2024-11-10 LAB — COMPREHENSIVE METABOLIC PANEL WITH GFR
ALT: 17 U/L (ref 0–53)
AST: 17 U/L (ref 0–37)
Albumin: 4.8 g/dL (ref 3.5–5.2)
Alkaline Phosphatase: 59 U/L (ref 39–117)
BUN: 15 mg/dL (ref 6–23)
CO2: 29 meq/L (ref 19–32)
Calcium: 9.3 mg/dL (ref 8.4–10.5)
Chloride: 103 meq/L (ref 96–112)
Creatinine, Ser: 0.88 mg/dL (ref 0.40–1.50)
GFR: 111.09 mL/min (ref >60.00)
Glucose, Bld: 91 mg/dL (ref 70–99)
Potassium: 3.7 meq/L (ref 3.5–5.1)
Sodium: 140 meq/L (ref 135–145)
Total Bilirubin: 1.4 mg/dL — ABNORMAL HIGH (ref 0.2–1.2)
Total Protein: 6.7 g/dL (ref 6.0–8.3)

## 2024-11-10 LAB — B12 AND FOLATE PANEL
Folate: 18.8 ng/mL (ref >5.9)
Vitamin B-12: >1500 pg/mL — ABNORMAL HIGH (ref 211–911)

## 2024-11-10 LAB — CBC WITH DIFFERENTIAL/PLATELET
Basophils Absolute: 0 K/uL (ref 0.0–0.1)
Basophils Relative: 0.7 % (ref 0.0–3.0)
Eosinophils Absolute: 0.1 K/uL (ref 0.0–0.7)
Eosinophils Relative: 2.3 % (ref 0.0–5.0)
HCT: 44.3 % (ref 39.0–52.0)
Hemoglobin: 15 g/dL (ref 13.0–17.0)
Lymphocytes Relative: 21.5 % (ref 12.0–46.0)
Lymphs Abs: 0.9 K/uL (ref 0.7–4.0)
MCHC: 33.8 g/dL (ref 30.0–36.0)
MCV: 90.2 fl (ref 78.0–100.0)
Monocytes Absolute: 0.4 K/uL (ref 0.1–1.0)
Monocytes Relative: 8.6 % (ref 3.0–12.0)
Neutro Abs: 2.9 K/uL (ref 1.4–7.7)
Neutrophils Relative %: 66.9 % (ref 43.0–77.0)
Platelets: 199 K/uL (ref 150.0–400.0)
RBC: 4.91 Mil/uL (ref 4.22–5.81)
RDW: 12.5 % (ref 11.5–15.5)
WBC: 4.4 K/uL (ref 4.0–10.5)

## 2024-11-10 LAB — IBC + FERRITIN
Ferritin: 29.9 ng/mL (ref 22.0–322.0)
Iron: 90 ug/dL (ref 42–165)
Saturation Ratios: 21.4 % (ref 20.0–50.0)
TIBC: 420 ug/dL (ref 250.0–450.0)
Transferrin: 300 mg/dL (ref 212.0–360.0)

## 2024-11-10 LAB — TSH: TSH: 1.16 u[IU]/mL (ref 0.35–5.50)

## 2024-11-10 NOTE — Assessment & Plan Note (Signed)
 Repeat ultrasound was done recently and normal   Fibrosis 4 Score = .73 (Low risk)        Interpretation for patients with NAFLD          <1.30       -  F0-F1 (Low risk)          1.30-2.67 -  Indeterminate           >2.67      -  F3-F4 (High risk)     Validated for ages 26-65

## 2024-11-10 NOTE — Assessment & Plan Note (Addendum)
 Daytine anxiety managed well with paxil  and abilify . Sleeping better wit addition of  trazodone  for insomnia; no changes today

## 2024-11-13 ENCOUNTER — Ambulatory Visit: Payer: Self-pay | Admitting: Internal Medicine

## 2024-11-14 ENCOUNTER — Ambulatory Visit: Admitting: Internal Medicine

## 2024-12-12 ENCOUNTER — Encounter: Payer: Self-pay | Admitting: Internal Medicine

## 2024-12-19 ENCOUNTER — Encounter: Payer: Self-pay | Admitting: Internal Medicine

## 2024-12-19 ENCOUNTER — Ambulatory Visit: Admitting: Internal Medicine

## 2024-12-19 VITALS — BP 114/86 | HR 83 | Ht 74.0 in | Wt 198.8 lb

## 2024-12-19 DIAGNOSIS — M25562 Pain in left knee: Secondary | ICD-10-CM | POA: Insufficient documentation

## 2024-12-19 MED ORDER — MELOXICAM 15 MG PO TABS: 15.0000 mg | ORAL_TABLET | Freq: Every day | ORAL | 2 refills | Status: AC

## 2024-12-19 NOTE — Progress Notes (Signed)
 "  Subjective:  Patient ID: Christian Montgomery, male    DOB: 02-Apr-1991  Age: 34 y.o. MRN: [id number]  CC: The primary encounter diagnosis was Left medial knee pain. A diagnosis of Medial knee pain, left was also pertinent to this visit.   HPI Christian Montgomery presents for  Chief Complaint  Patient presents with   Knee Pain    Left knee pain, popping x 1 month    Christian Montgomery is a 34 yr old male who presents with recurrent episodes of  Left knee pain and popping that has been occurring with ambulation   the pain is described as sharp , and the knee feels as though it will give way at times for the past month.  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  Has tried   Outpatient Medications Prior to Visit  Medication Sig Dispense Refill   ALPRAZolam  (XANAX ) 0.25 MG tablet Take 1 tablet (0.25 mg total) by mouth as needed for anxiety (panic attacks). 30 tablet 1   ARIPiprazole  (ABILIFY ) 2 MG tablet TAKE 1 TABLET BY MOUTH EVERY DAY 90 tablet 1   montelukast  (SINGULAIR ) 10 MG tablet Take 1 tablet (10 mg total) by mouth at bedtime. 90 tablet 3   Multiple Vitamins-Minerals (MULTIVITAMIN ADULTS PO) Take by mouth.     pantoprazole  (PROTONIX ) 40 MG tablet Take 1 tablet (40 mg total) by mouth daily. 90 tablet 2   PARoxetine  (PAXIL -CR) 37.5 MG 24 hr tablet Take 1 tablet (37.5 mg total) by mouth daily. 90 tablet 1   traZODone  (DESYREL ) 50 MG tablet Take 0.5-1 tablets (25-50 mg total) by mouth at bedtime as needed for sleep. 90 tablet 1   triamcinolone  cream (KENALOG ) 0.1 % Apply 1 Application topically 2 (two) times daily. 80 g 1   No facility-administered medications prior to visit.    Review of Systems;  Patient denies headache, fevers, malaise, unintentional weight loss, skin rash, eye pain, sinus congestion and sinus pain, sore throat, dysphagia,  hemoptysis , cough, dyspnea, wheezing, chest pain, palpitations,  orthopnea, edema, abdominal pain, nausea, melena, diarrhea, constipation, flank pain, dysuria, hematuria, urinary  Frequency, nocturia, numbness, tingling, seizures,  Focal weakness, Loss of consciousness,  Tremor, insomnia, depression, anxiety, and suicidal ideation.      Objective:  BP 114/86   Pulse 83   Ht 6' 2 (1.88 m)   Wt 198 lb 12.8 oz (90.2 kg)   SpO2 95%   BMI 25.52 kg/m   BP Readings from Last 3 Encounters:  12/19/24 114/86  11/09/24 110/76  09/12/24 126/72    Wt Readings from Last 3 Encounters:  12/19/24 198 lb 12.8 oz (90.2 kg)  11/09/24 193 lb 6.4 oz (87.7 kg)  09/12/24 190 lb 6.4 oz (86.4 kg)    Physical Exam Vitals reviewed.  Constitutional:      General: Christian Montgomery is not in acute distress.    Appearance: Normal appearance. Christian Montgomery is normal weight. Christian Montgomery is not ill-appearing, toxic-appearing or diaphoretic.  HENT:     Head: Normocephalic.  Eyes:     General: No scleral icterus.       Right eye: No discharge.        Left eye: No discharge.     Conjunctiva/sclera: Conjunctivae normal.  Cardiovascular:     Rate and Rhythm: Normal rate and regular rhythm.     Heart sounds: Normal heart sounds.  Pulmonary:     Effort: Pulmonary effort is normal. No respiratory distress.     Breath sounds: Normal breath sounds.  Musculoskeletal:        General: Tenderness present. Normal range of motion.     Cervical back: Normal range of motion.       Legs:  Skin:    General: Skin is warm and dry.  Neurological:     General: No focal deficit present.     Mental Status: Christian Montgomery is alert and oriented to person, place, and time. Mental status is at baseline.  Psychiatric:        Mood and Affect: Mood normal.        Behavior: Behavior normal.        Thought Content: Thought content normal.        Judgment: Judgment normal.     Lab Results  Component Value Date   HGBA1C 5.5 01/05/2023   HGBA1C 5.5 01/06/2022   HGBA1C 5.4 05/27/2013    Lab Results  Component Value Date    CREATININE 0.88 11/09/2024   CREATININE 0.85 08/15/2024   CREATININE 1.14 10/05/2023    Lab Results  Component Value Date   WBC 4.4 11/09/2024   HGB 15.0 11/09/2024   HCT 44.3 11/09/2024   PLT 199.0 11/09/2024   GLUCOSE 91 11/09/2024   CHOL 162 08/15/2024   TRIG 92.0 08/15/2024   HDL 49.00 08/15/2024   LDLDIRECT 102.0 08/15/2024   LDLCALC 95 08/15/2024   ALT 17 11/09/2024   AST 17 11/09/2024   NA 140 11/09/2024   K 3.7 11/09/2024   CL 103 11/09/2024   CREATININE 0.88 11/09/2024   BUN 15 11/09/2024   CO2 29 11/09/2024   TSH 1.16 11/09/2024   PSA 1.41 01/16/2023   INR 1.3 (H) 02/24/2014   HGBA1C 5.5 01/05/2023    MR CARDIAC MORPHOLOGY W WO CONTRAST Result Date: 11/11/2023 CLINICAL DATA:  Reduced EF EXAM: CARDIAC MRI TECHNIQUE: The patient was scanned on a 1.5 Tesla Siemens magnet. A dedicated cardiac coil was used. Functional imaging was done using Fiesta sequences. 2,3, and 4 chamber views were done to assess for RWMA's. Modified Simpson's rule using a short axis stack was used to calculate an ejection fraction on a dedicated work [employer]. The patient received 10 cc of Gadavist . After 10 minutes inversion recovery sequences were used to assess for infiltration and scar tissue. Velocity flow mapping performed in the ascending aorta and main pulmonary artery. CONTRAST:  10 cc  of Gadavist  FINDINGS: 1. Normal left ventricular size, thickness and systolic function (LVEF = 57%). There are no regional wall motion abnormalities. There is no late gadolinium enhancement in the left ventricular myocardium. LVEDV: 181 ml LVESV: 76 ml SV: 105 ml CO: 5.9 L/min Myocardial mass: 88 g LV Native T1 843 ms (normal <1058ms) LV ECV value 25% (normal <30%) 2. Normal right ventricular size, thickness and systolic function (RVEF = 52%). There are no regional wall motion abnormalities. 3.  Normal left and right atrial size. 4. Normal size of the aortic root, ascending aorta and  pulmonary artery. 5.  No significant valvular abnormalities. 6.  Normal pericardium.  No pericardial effusion. IMPRESSION: 1.  Normal LV size and systolic function.  LVEF 57%. 2.  No LGE or scar. 3.  No evidence for myocardial infiltration. 4.  Normal RV size and function. 5.  No significant valvular abnormalities Electronically Signed   By: Redell Cave M.D.   On: 11/11/2023 14:04  MR CARDIAC VELOCITY FLOW MAP Result Date: 11/11/2023 CLINICAL DATA:  Reduced EF EXAM: CARDIAC MRI TECHNIQUE: The patient was scanned on a 1.5 Tesla Siemens magnet. A dedicated cardiac coil was used. Functional imaging was done using Fiesta sequences. 2,3, and 4 chamber views were done to assess for RWMA's. Modified Simpson's rule using a short axis stack was used to calculate an ejection fraction on a dedicated work [employer]. The patient received 10 cc of Gadavist . After 10 minutes inversion recovery sequences were used to assess for infiltration and scar tissue. Velocity flow mapping performed in the ascending aorta and main pulmonary artery. CONTRAST:  10 cc  of Gadavist  FINDINGS: 1. Normal left ventricular size, thickness and systolic function (LVEF = 57%). There are no regional wall motion abnormalities. There is no late gadolinium enhancement in the left ventricular myocardium. LVEDV: 181 ml LVESV: 76 ml SV: 105 ml CO: 5.9 L/min Myocardial mass: 88 g LV Native T1 843 ms (normal <106ms) LV ECV value 25% (normal <30%) 2. Normal right ventricular size, thickness and systolic function (RVEF = 52%). There are no regional wall motion abnormalities. 3.  Normal left and right atrial size. 4. Normal size of the aortic root, ascending aorta and pulmonary artery. 5.  No significant valvular abnormalities. 6.  Normal pericardium.  No pericardial effusion. IMPRESSION: 1.  Normal LV size and systolic function.  LVEF 57%. 2.  No LGE or scar. 3.  No evidence for myocardial infiltration. 4.  Normal RV size and  function. 5.  No significant valvular abnormalities Electronically Signed   By: Redell Cave M.D.   On: 11/11/2023 14:04   MR CARDIAC VELOCITY FLOW MAP Result Date: 11/11/2023 CLINICAL DATA:  Reduced EF EXAM: CARDIAC MRI TECHNIQUE: The patient was scanned on a 1.5 Tesla Siemens magnet. A dedicated cardiac coil was used. Functional imaging was done using Fiesta sequences. 2,3, and 4 chamber views were done to assess for RWMA's. Modified Simpson's rule using a short axis stack was used to calculate an ejection fraction on a dedicated work [employer]. The patient received 10 cc of Gadavist . After 10 minutes inversion recovery sequences were used to assess for infiltration and scar tissue. Velocity flow mapping performed in the ascending aorta and main pulmonary artery. CONTRAST:  10 cc  of Gadavist  FINDINGS: 1. Normal left ventricular size, thickness and systolic function (LVEF = 57%). There are no regional wall motion abnormalities. There is no late gadolinium enhancement in the left ventricular myocardium. LVEDV: 181 ml LVESV: 76 ml SV: 105 ml CO: 5.9 L/min Myocardial mass: 88 g LV Native T1 843 ms (normal <1081ms) LV ECV value 25% (normal <30%) 2. Normal right ventricular size, thickness and systolic function (RVEF = 52%). There are no regional wall motion abnormalities. 3.  Normal left and right atrial size. 4. Normal size of the aortic root, ascending aorta and pulmonary artery. 5.  No significant valvular abnormalities. 6.  Normal pericardium.  No pericardial effusion. IMPRESSION: 1.  Normal LV size and systolic function.  LVEF 57%. 2.  No LGE or scar. 3.  No evidence for myocardial infiltration. 4.  Normal RV size and function. 5.  No significant valvular abnormalities Electronically Signed   By: Redell Cave M.D.   On: 11/11/2023 14:04    Assessment & Plan:  .Left medial knee pain Assessment & Plan: Exam and histor y suggestive of meniscal strain.  NSAIDs ,  ortho  referral made  Orders: -  Ambulatory referral to Orthopedic Surgery  Medial knee pain, left Assessment & Plan: Exam and histor y suggestive of meniscal strain.  NSAIDs ,  ortho referral made   Other orders -     Meloxicam ; Take 1 tablet (15 mg total) by mouth daily.  Dispense: 30 tablet; Refill: 2   \  Follow-up: No follow-ups on file.   Verneita LITTIE Kettering, MD "

## 2024-12-19 NOTE — Assessment & Plan Note (Signed)
 Exam and histor y suggestive of meniscal strain.  NSAIDs ,  ortho referral made

## 2024-12-19 NOTE — Patient Instructions (Signed)
 Your knee pain and popping  sounds like a loose ligament or meniscal tear .  I am making a referral to  [facility].    In the meantime you can Take meloxcam  once daily;  do NOT COMBINE aLEVE  or motrin with this  BUT IT IS Ok to to take tylenol   with tMELOXICAM  .  You can take 3000 mg of acetominophen (tylenol ) every day safely  In divided doses (750 mg  every 6 hours  Or 1000 mg every 8 hours.)  TRY TAKING YOUR TRAZODONE  AS YOU ARE LEAVING WORK,  SINCE IT CAN TAKE UP TO AN HOUR TO START WORKING

## 2025-01-27 ENCOUNTER — Encounter: Payer: 59 | Admitting: Internal Medicine

## 2025-05-10 ENCOUNTER — Ambulatory Visit: Admitting: Internal Medicine
